# Patient Record
Sex: Female | Born: 1990 | Race: Black or African American | Hispanic: No | Marital: Single | Smoking: Current every day smoker
Health system: Southern US, Community
[De-identification: ages and names within clinical notes are randomized; demographics above are authoritative.]

## PROBLEM LIST (undated history)

## (undated) DIAGNOSIS — D649 Anemia, unspecified: Secondary | ICD-10-CM

## (undated) DIAGNOSIS — M1990 Unspecified osteoarthritis, unspecified site: Secondary | ICD-10-CM

## (undated) DIAGNOSIS — I10 Essential (primary) hypertension: Secondary | ICD-10-CM

## (undated) DIAGNOSIS — M109 Gout, unspecified: Secondary | ICD-10-CM

## (undated) HISTORY — DX: Anemia, unspecified: D64.9

## (undated) HISTORY — PX: LEG SURGERY: SHX1003

## (undated) HISTORY — PX: KNEE SURGERY: SHX244

---
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
## 2012-09-19 ENCOUNTER — Encounter (HOSPITAL_COMMUNITY): Payer: Self-pay | Admitting: Emergency Medicine

## 2012-09-19 ENCOUNTER — Emergency Department (HOSPITAL_COMMUNITY)
Admission: EM | Admit: 2012-09-19 | Discharge: 2012-09-19 | Disposition: A | Discharge status: Home / Self Care | Payer: Managed Care, Other (non HMO) | Attending: Emergency Medicine | Admitting: Emergency Medicine

## 2012-09-19 DIAGNOSIS — F17200 Nicotine dependence, unspecified, uncomplicated: Secondary | ICD-10-CM | POA: Insufficient documentation

## 2012-09-19 DIAGNOSIS — R110 Nausea: Secondary | ICD-10-CM | POA: Insufficient documentation

## 2012-09-19 DIAGNOSIS — N390 Urinary tract infection, site not specified: Secondary | ICD-10-CM | POA: Insufficient documentation

## 2012-09-19 DIAGNOSIS — Z3202 Encounter for pregnancy test, result negative: Secondary | ICD-10-CM | POA: Insufficient documentation

## 2012-09-19 LAB — URINALYSIS, ROUTINE W REFLEX MICROSCOPIC
Bilirubin Urine: NEGATIVE
Ketones, ur: NEGATIVE mg/dL
Nitrite: POSITIVE — AB
Urobilinogen, UA: 0.2 mg/dL (ref 0.0–1.0)
pH: 6.5 (ref 5.0–8.0)

## 2012-09-19 LAB — URINE MICROSCOPIC-ADD ON

## 2012-09-19 MED ORDER — IBUPROFEN 600 MG PO TABS: 600.0000 mg | ORAL_TABLET | Freq: Four times a day (QID) | ORAL | Status: DC | PRN

## 2012-09-19 MED ORDER — IBUPROFEN 800 MG PO TABS
800.0000 mg | ORAL_TABLET | Freq: Once | ORAL | Status: AC
  Administered 2012-09-19: 800 mg via ORAL
  Filled 2012-09-19: qty 1

## 2012-09-19 MED ORDER — CIPROFLOXACIN HCL 500 MG PO TABS: 250.0000 mg | ORAL_TABLET | Freq: Two times a day (BID) | ORAL | Status: DC

## 2012-09-19 NOTE — ED Notes (Signed)
Pt states she is having R abdominal and flank pain and thinks she might be pregnant. Pt is unable to say how late her period due to having irregular menses since birth of child less than a year ago. Pt also states is does not feel right when she urinates.

## 2012-09-19 NOTE — ED Provider Notes (Signed)
CSN: [id number]     Arrival date & time 09/19/12  0009 History   First MD Initiated Contact with Patient 09/19/12 0021     Chief Complaint  Patient presents with  . Flank Pain   (Consider location/radiation/quality/duration/timing/severity/associated sxs/prior Treatment) HPI Patient is a generally healthy 22 yo woman who is BIB family from home. The patient has noticed some mild nausea and some mild right flank pain x 3 days and can't recall the date of her LMP so she came to the ED. She denies dysuria, gross hematuria, fever, nausea and vomiting.   Pain in worse when the patient is laying in the right lateral decubitus position. Patient has not noticed variation in pain with relation to what or when she eats. No history of similar sx. No history of abdominal surgeries. No vaginal discharge.   Pain 3/10 at present. Pain worse when patient is on her feet for a prolonged about of time.   History reviewed. No pertinent past medical history. Past Surgical History  Procedure Laterality Date  . Knee surgery    . Leg surgery     Family History  Problem Relation Age of Onset  . Alcohol abuse Neg Hx   . Arthritis Neg Hx   . Asthma Neg Hx   . Birth defects Neg Hx   . Cancer Neg Hx   . COPD Neg Hx   . Depression Neg Hx   . Diabetes Neg Hx   . Drug abuse Neg Hx   . Early death Neg Hx   . Hearing loss Neg Hx   . Heart disease Neg Hx   . Hyperlipidemia Neg Hx   . Hypertension Neg Hx   . Kidney disease Neg Hx   . Learning disabilities Neg Hx   . Mental illness Neg Hx   . Mental retardation Neg Hx   . Miscarriages / Stillbirths Neg Hx   . Stroke Neg Hx   . Vision loss Neg Hx    History  Substance Use Topics  . Smoking status: Current Every Day Smoker  . Smokeless tobacco: Never Used  . Alcohol Use: No   OB History   Grav Para Term Preterm Abortions TAB SAB Ect Mult Living   2 2 2       2      Review of Systems 10 point ROS obtained and is negative with the exception of the sx  noted above.   Allergies  Review of patient's allergies indicates no known allergies.  Home Medications  No current outpatient prescriptions on file. BP 144/102  Pulse 91  Temp(Src) 98.6 F (37 C) (Oral)  Resp 18  SpO2 98% Physical Exam Gen: well developed and well nourished appearing, smiling and giggling. Head: NCAT Eyes: PERL, EOMI Nose: no epistaixis or rhinorrhea Mouth/throat: mucosa is moist and pink Neck: supple, no stridor Lungs: CTA B, no wheezing, rhonchi or rales CV: Regular rate and rhythm, no murmur Abd: soft, notender, nondistended Back: no ttp, no cva ttp Skin: Warm and dry Neuro: CN ii-xii grossly intact, no focal deficits Extremities: Normal to inspection Psyche; normal affect,  calm and cooperative.   ED Course  Procedures (including critical care time) Results for orders placed during the hospital encounter of 09/19/12 (from the past 24 hour(s))  URINALYSIS, ROUTINE W REFLEX MICROSCOPIC     Status: Abnormal   Collection Time    09/19/12 12:22 AM      Result Value Range   Color, Urine STRAW (*) YELLOW  APPearance CLOUDY (*) CLEAR   Specific Gravity, Urine 1.015  1.005 - 1.030   pH 6.5  5.0 - 8.0   Glucose, UA NEGATIVE  NEGATIVE mg/dL   Hgb urine dipstick LARGE (*) NEGATIVE   Bilirubin Urine NEGATIVE  NEGATIVE   Ketones, ur NEGATIVE  NEGATIVE mg/dL   Protein, ur NEGATIVE  NEGATIVE mg/dL   Urobilinogen, UA 0.2  0.0 - 1.0 mg/dL   Nitrite POSITIVE (*) NEGATIVE   Leukocytes, UA SMALL (*) NEGATIVE  URINE MICROSCOPIC-ADD ON     Status: Abnormal   Collection Time    09/19/12 12:22 AM      Result Value Range   Squamous Epithelial / LPF MANY (*) RARE   WBC, UA 11-20  <3 WBC/hpf   RBC / HPF 11-20  <3 RBC/hpf   Bacteria, UA MANY (*) RARE  POCT PREGNANCY, URINE     Status: None   Collection Time    09/19/12 12:34 AM      Result Value Range   Preg Test, Ur NEGATIVE  NEGATIVE   MDM  DDX: UTI, cervicitis, pregnancy with threatened abortion or  ectopic    Patient with UTI. Given alternative diagnosis and lack of any vaginal discharge, I don't feel that pelvic exam is needed as part of [facility]. We have ruled out pregnancy and related complications.   She is afebrile and nontoxic appearing. Stable for discharge with first po abx in ED. We will send urine for culture and manage empirically. Patient referred to the [facility] for f/u as needed and counseled to return to the ED for red flag sx.     Brandt Loosen, MD 09/19/12 0130

## 2012-09-19 NOTE — ED Notes (Signed)
C/o intermittent R flank pain since yesterday with difficulty urinating.  Unknown LMP

## 2012-09-21 LAB — URINE CULTURE: Colony Count: >=100000

## 2012-09-25 ENCOUNTER — Encounter (HOSPITAL_COMMUNITY): Payer: Self-pay | Admitting: *Deleted

## 2012-09-25 ENCOUNTER — Emergency Department (HOSPITAL_COMMUNITY)
Admission: EM | Admit: 2012-09-25 | Discharge: 2012-09-25 | Disposition: A | Discharge status: Home / Self Care | Payer: Managed Care, Other (non HMO) | Attending: Emergency Medicine | Admitting: Emergency Medicine

## 2012-09-25 DIAGNOSIS — N12 Tubulo-interstitial nephritis, not specified as acute or chronic: Secondary | ICD-10-CM

## 2012-09-25 DIAGNOSIS — F17200 Nicotine dependence, unspecified, uncomplicated: Secondary | ICD-10-CM | POA: Insufficient documentation

## 2012-09-25 DIAGNOSIS — B9689 Other specified bacterial agents as the cause of diseases classified elsewhere: Secondary | ICD-10-CM | POA: Insufficient documentation

## 2012-09-25 DIAGNOSIS — Z792 Long term (current) use of antibiotics: Secondary | ICD-10-CM | POA: Insufficient documentation

## 2012-09-25 DIAGNOSIS — Z87440 Personal history of urinary (tract) infections: Secondary | ICD-10-CM | POA: Insufficient documentation

## 2012-09-25 DIAGNOSIS — R350 Frequency of micturition: Secondary | ICD-10-CM | POA: Insufficient documentation

## 2012-09-25 DIAGNOSIS — A499 Bacterial infection, unspecified: Secondary | ICD-10-CM | POA: Insufficient documentation

## 2012-09-25 DIAGNOSIS — N760 Acute vaginitis: Secondary | ICD-10-CM | POA: Insufficient documentation

## 2012-09-25 DIAGNOSIS — Z3202 Encounter for pregnancy test, result negative: Secondary | ICD-10-CM | POA: Insufficient documentation

## 2012-09-25 DIAGNOSIS — Z79899 Other long term (current) drug therapy: Secondary | ICD-10-CM | POA: Insufficient documentation

## 2012-09-25 LAB — URINE MICROSCOPIC-ADD ON

## 2012-09-25 LAB — BASIC METABOLIC PANEL
BUN: 12 mg/dL (ref 6–23)
Calcium: 8.8 mg/dL (ref 8.4–10.5)
Creatinine, Ser: 0.88 mg/dL (ref 0.50–1.10)
GFR calc non Af Amer: >90 mL/min (ref >90)
Glucose, Bld: 96 mg/dL (ref 70–99)
Sodium: 135 mEq/L (ref 135–145)

## 2012-09-25 LAB — URINALYSIS, ROUTINE W REFLEX MICROSCOPIC
Bilirubin Urine: NEGATIVE
Glucose, UA: NEGATIVE mg/dL
Ketones, ur: NEGATIVE mg/dL
Protein, ur: NEGATIVE mg/dL

## 2012-09-25 LAB — WET PREP, GENITAL
Trich, Wet Prep: NONE SEEN
Yeast Wet Prep HPF POC: NONE SEEN

## 2012-09-25 LAB — CBC WITH DIFFERENTIAL/PLATELET
Eosinophils Absolute: 0.2 10*3/uL (ref 0.0–0.7)
Eosinophils Relative: 2 % (ref 0–5)
Lymphs Abs: 2.8 10*3/uL (ref 0.7–4.0)
MCH: 27.6 pg (ref 26.0–34.0)
MCV: 82.7 fL (ref 78.0–100.0)
Monocytes Absolute: 0.4 10*3/uL (ref 0.1–1.0)
Monocytes Relative: 4 % (ref 3–12)
Platelets: 177 10*3/uL (ref 150–400)
RBC: 4.45 MIL/uL (ref 3.87–5.11)

## 2012-09-25 MED ORDER — METRONIDAZOLE 500 MG PO TABS: 500.0000 mg | ORAL_TABLET | Freq: Two times a day (BID) | ORAL | Status: DC

## 2012-09-25 MED ORDER — DEXTROSE 5 % IV SOLN
1.0000 g | Freq: Once | INTRAVENOUS | Status: AC
  Administered 2012-09-25: 1 g via INTRAVENOUS
  Filled 2012-09-25: qty 10

## 2012-09-25 MED ORDER — CEPHALEXIN 500 MG PO CAPS: 500.0000 mg | ORAL_CAPSULE | Freq: Four times a day (QID) | ORAL | Status: DC

## 2012-09-25 NOTE — ED Notes (Addendum)
Per EMS pt coming from home with c/o severe right lower abdominal pain, radiating to groin and back. Pt was seen at [facility] last Tuesday for same and was diagnosed with UTI and given prescription for antibiotics but hasn't filled it yet. Pt also sts she is now having difficulties urinating and has noticed blood when wiping. Pt given 30mg  of toradol and 4 mg of zofran IV en route

## 2012-09-25 NOTE — ED Provider Notes (Signed)
CSN: [id number]     Arrival date & time 09/25/12  1002 History   First MD Initiated Contact with Patient 09/25/12 1047     Chief Complaint  Patient presents with  . Abdominal Pain   (Consider location/radiation/quality/duration/timing/severity/associated sxs/prior Treatment) HPI Comments: Pt comes in with cc of abd pain. Pt was recently diagnosed with UTI, didn't fill her script. States that she woke up today, with abd pain to the suprapubic region, and flank region. She has some nausea, chills, no emesis, fevers, diarrhea, no vaginal discharge, no hx of STDs.  The history is provided by the patient.    History reviewed. No pertinent past medical history. Past Surgical History  Procedure Laterality Date  . Knee surgery    . Leg surgery     Family History  Problem Relation Age of Onset  . Alcohol abuse Neg Hx   . Arthritis Neg Hx   . Asthma Neg Hx   . Birth defects Neg Hx   . Cancer Neg Hx   . COPD Neg Hx   . Depression Neg Hx   . Diabetes Neg Hx   . Drug abuse Neg Hx   . Early death Neg Hx   . Hearing loss Neg Hx   . Heart disease Neg Hx   . Hyperlipidemia Neg Hx   . Hypertension Neg Hx   . Kidney disease Neg Hx   . Learning disabilities Neg Hx   . Mental illness Neg Hx   . Mental retardation Neg Hx   . Miscarriages / Stillbirths Neg Hx   . Stroke Neg Hx   . Vision loss Neg Hx    History  Substance Use Topics  . Smoking status: Current Every Day Smoker  . Smokeless tobacco: Never Used  . Alcohol Use: No   OB History   Grav Para Term Preterm Abortions TAB SAB Ect Mult Living   2 2 2       2      Review of Systems  Constitutional: Negative for activity change.  HENT: Negative for neck pain.   Respiratory: Negative for shortness of breath.   Cardiovascular: Negative for chest pain.  Gastrointestinal: Negative for nausea, vomiting and abdominal pain.  Genitourinary: Positive for frequency and flank pain. Negative for dysuria, hematuria and vaginal discharge.   Neurological: Negative for headaches.    Allergies  Review of patient's allergies indicates no known allergies.  Home Medications   Current Outpatient Rx  Name  Route  Sig  Dispense  Refill  . cephALEXin (KEFLEX) 500 MG capsule   Oral   Take 1 capsule (500 mg total) by mouth 4 (four) times daily.   40 capsule   0   . ibuprofen (ADVIL,MOTRIN) 600 MG tablet   Oral   Take 1 tablet (600 mg total) by mouth every 6 (six) hours as needed for pain.   30 tablet   0   . metroNIDAZOLE (FLAGYL) 500 MG tablet   Oral   Take 1 tablet (500 mg total) by mouth 2 (two) times daily.   14 tablet   0    BP 133/81  Pulse 83  Temp(Src) 98.5 F (36.9 C) (Oral)  Resp 18  SpO2 98% Physical Exam  Constitutional: She is oriented to person, place, and time. She appears well-developed and well-nourished.  HENT:  Head: Normocephalic and atraumatic.  Eyes: Conjunctivae and EOM are normal. Pupils are equal, round, and reactive to light.  Neck: Normal range of motion. Neck supple.  Cardiovascular:  Normal rate, regular rhythm, normal heart sounds and intact distal pulses.   No murmur heard. Pulmonary/Chest: Effort normal. No respiratory distress. She has no wheezes.  Abdominal: Soft. Bowel sounds are normal. She exhibits no distension. There is tenderness. There is no rebound and no guarding.  Genitourinary: Vagina normal and uterus normal.  External exam - normal, no lesions Speculum exam: Pt has some white discharge, no blood Bimanual exam: Patient has no CMT, no adnexal tenderness or fullness and cervical os is closed  Neurological: She is alert and oriented to person, place, and time.  Skin: Skin is warm and dry.    ED Course  Procedures (including critical care time) Labs Review Labs Reviewed  WET PREP, GENITAL - Abnormal; Notable for the following:    Clue Cells Wet Prep HPF POC FEW (*)    WBC, Wet Prep HPF POC FEW (*)    All other components within normal limits  URINALYSIS, ROUTINE  W REFLEX MICROSCOPIC - Abnormal; Notable for the following:    APPearance CLOUDY (*)    Hgb urine dipstick LARGE (*)    Leukocytes, UA MODERATE (*)    All other components within normal limits  URINE MICROSCOPIC-ADD ON - Abnormal; Notable for the following:    Squamous Epithelial / LPF FEW (*)    Bacteria, UA MANY (*)    All other components within normal limits  GC/CHLAMYDIA PROBE AMP  URINE CULTURE  CBC WITH DIFFERENTIAL  BASIC METABOLIC PANEL  PREGNANCY, URINE   Imaging Review No results found.  MDM   1. Pyelonephritis   2. Bacterial vaginosis    Pt comes in with cc of abd pain, flank pain. Recently diagnosed with UTI, didn't take her scripts. Non toxic. GU exam is WNL. Will d./c. Return precautions provided.  Derwood Kaplan, MD 09/25/12 1615

## 2012-09-25 NOTE — ED Notes (Signed)
Bed: WA21 Expected date:  Expected time:  Means of arrival:  Comments: EMS  

## 2012-09-27 LAB — URINE CULTURE: Colony Count: >=100000

## 2012-12-19 ENCOUNTER — Emergency Department (HOSPITAL_COMMUNITY)
Admission: EM | Admit: 2012-12-19 | Discharge: 2012-12-19 | Disposition: A | Discharge status: Home / Self Care | Payer: Managed Care, Other (non HMO) | Source: Home / Self Care | Attending: Emergency Medicine | Admitting: Emergency Medicine

## 2012-12-19 ENCOUNTER — Encounter (HOSPITAL_COMMUNITY): Payer: Self-pay | Admitting: Emergency Medicine

## 2012-12-19 DIAGNOSIS — J029 Acute pharyngitis, unspecified: Secondary | ICD-10-CM

## 2012-12-19 DIAGNOSIS — J111 Influenza due to unidentified influenza virus with other respiratory manifestations: Secondary | ICD-10-CM

## 2012-12-19 MED ORDER — IBUPROFEN 800 MG PO TABS
ORAL_TABLET | ORAL | Status: AC
  Filled 2012-12-19: qty 1

## 2012-12-19 MED ORDER — HYDROCODONE-ACETAMINOPHEN 5-325 MG PO TABS: ORAL_TABLET | ORAL | Status: DC

## 2012-12-19 MED ORDER — HYDROCOD POLST-CHLORPHEN POLST 10-8 MG/5ML PO LQCR: 5.0000 mL | Freq: Two times a day (BID) | ORAL | Status: DC | PRN

## 2012-12-19 MED ORDER — ONDANSETRON 8 MG PO TBDP: 8.0000 mg | ORAL_TABLET | Freq: Three times a day (TID) | ORAL | Status: DC | PRN

## 2012-12-19 MED ORDER — IBUPROFEN 800 MG PO TABS
800.0000 mg | ORAL_TABLET | Freq: Once | ORAL | Status: AC
  Administered 2012-12-19: 800 mg via ORAL

## 2012-12-19 MED ORDER — OSELTAMIVIR PHOSPHATE 75 MG PO CAPS: 75.0000 mg | ORAL_CAPSULE | Freq: Two times a day (BID) | ORAL | Status: DC

## 2012-12-19 MED ORDER — AMOXICILLIN 500 MG PO CAPS: 500.0000 mg | ORAL_CAPSULE | Freq: Three times a day (TID) | ORAL | Status: DC

## 2012-12-19 NOTE — ED Notes (Signed)
Pt  Has symptoms  Of  sorethroat      /  Body  Aches     Nasal  Congestion        Stuffy  Nose    chillds  With  The  Symptoms  Beginning     2  Days  Ago         Pt is  Sitting  Upright on  Exam table  Speaking in  Complete  sentances     She  Is  Masked  And  Is  In a  Private  Room

## 2012-12-19 NOTE — ED Provider Notes (Signed)
Chief Complaint:   Chief Complaint  Patient presents with  . URI    History of Present Illness:   Anita [name] is a 22 year old female who has had a three-day history of a sore throat, subjective fever, chills, nasal congestion, rhinorrhea, loose and rattly but nonproductive cough, nausea, and vomiting. She denies any headache, muscle aches, abdominal pain, diarrhea, earache, or difficulty breathing. No known sick exposures.  Review of Systems:  Other than noted above, the patient denies any of the following symptoms: Systemic:  No fevers, chills, sweats, weight loss or gain, fatigue, or tiredness. Eye:  No redness or discharge. ENT:  No ear pain, drainage, headache, nasal congestion, drainage, sinus pressure, difficulty swallowing, or sore throat. Neck:  No neck pain or swollen glands. Lungs:  No cough, sputum production, hemoptysis, wheezing, chest tightness, shortness of breath or chest pain. GI:  No abdominal pain, nausea, vomiting or diarrhea.  PMFSH:  Past medical history, family history, social history, meds, and allergies were reviewed.  Physical Exam:   Vital signs:  BP 176/90  Pulse 76  Temp(Src) 98.6 F (37 C) (Oral)  Resp 18  SpO2 100% General:  Alert and oriented.  In no distress.  Skin warm and dry. Appears listless and is lying on the exam table, but does arouse and talk to me and was able to sit up. Eye:  No conjunctival injection or drainage. Lids were normal. ENT:  TMs and canals were normal, without erythema or inflammation.  Nasal mucosa was clear and uncongested, without drainage.  Mucous membranes were moist.  Pharynx was erythematous with no exudate or drainage.  There were no oral ulcerations or lesions. Neck:  Supple, no adenopathy, tenderness or mass. Lungs:  No respiratory distress.  Lungs were clear to auscultation, without wheezes, rales or rhonchi.  Breath sounds were clear and equal bilaterally.  Heart:  Regular rhythm, without gallops, murmers or  rubs. Skin:  Clear, warm, and dry, without rash or lesions.  Labs:   Results for orders placed during the hospital encounter of 12/19/12  POCT RAPID STREP A ([facility] URG CARE ONLY)      Result Value Range   Streptococcus, Group A Screen (Direct) NEGATIVE  NEGATIVE    Assessment:  The primary encounter diagnosis was Pharyngitis. A diagnosis of Influenza-like illness was also pertinent to this visit.  Plan:   1.  Meds:  The following meds were prescribed:   Discharge Medication List as of 12/19/2012  5:05 PM    START taking these medications   Details  amoxicillin (AMOXIL) 500 MG capsule Take 1 capsule (500 mg total) by mouth 3 (three) times daily., Starting 12/19/2012, Until Discontinued, Normal    chlorpheniramine-HYDROcodone (TUSSIONEX) 10-8 MG/5ML LQCR Take 5 mLs by mouth every 12 (twelve) hours as needed for cough., Starting 12/19/2012, Until Discontinued, Normal    HYDROcodone-acetaminophen (NORCO/VICODIN) 5-325 MG per tablet 1 to 2 tabs every 4 to 6 hours as needed for pain., Print    ondansetron (ZOFRAN ODT) 8 MG disintegrating tablet Take 1 tablet (8 mg total) by mouth every 8 (eight) hours as needed for nausea., Starting 12/19/2012, Until Discontinued, Normal    oseltamivir (TAMIFLU) 75 MG capsule Take 1 capsule (75 mg total) by mouth every 12 (twelve) hours., Starting 12/19/2012, Until Discontinued, Normal        2.  Patient Education/Counseling:  The patient was given appropriate handouts, self care instructions, and instructed in symptomatic relief.  Suggested clear liquids today, advancing to a brat diet  tomorrow.  3.  Follow up:  The patient was told to follow up if no better in 3 to 4 days, if becoming worse in any way, and given some red flag symptoms such as increasing fever, difficulty breathing, persistent vomiting which would prompt immediate return.  Follow up here as needed.      Reuben Likes, MD 12/19/12 [phone]

## 2012-12-21 LAB — CULTURE, GROUP A STREP

## 2013-04-25 ENCOUNTER — Encounter (HOSPITAL_COMMUNITY): Payer: Self-pay | Admitting: Emergency Medicine

## 2013-04-25 ENCOUNTER — Emergency Department (HOSPITAL_COMMUNITY)
Admission: EM | Admit: 2013-04-25 | Discharge: 2013-04-25 | Disposition: A | Discharge status: Home / Self Care | Payer: Managed Care, Other (non HMO) | Attending: Emergency Medicine | Admitting: Emergency Medicine

## 2013-04-25 ENCOUNTER — Emergency Department (HOSPITAL_COMMUNITY): Payer: Managed Care, Other (non HMO)

## 2013-04-25 DIAGNOSIS — R0981 Nasal congestion: Secondary | ICD-10-CM

## 2013-04-25 DIAGNOSIS — J40 Bronchitis, not specified as acute or chronic: Secondary | ICD-10-CM | POA: Insufficient documentation

## 2013-04-25 DIAGNOSIS — R6883 Chills (without fever): Secondary | ICD-10-CM | POA: Insufficient documentation

## 2013-04-25 DIAGNOSIS — J3489 Other specified disorders of nose and nasal sinuses: Secondary | ICD-10-CM | POA: Insufficient documentation

## 2013-04-25 DIAGNOSIS — R51 Headache: Secondary | ICD-10-CM | POA: Insufficient documentation

## 2013-04-25 DIAGNOSIS — E663 Overweight: Secondary | ICD-10-CM | POA: Insufficient documentation

## 2013-04-25 DIAGNOSIS — F17200 Nicotine dependence, unspecified, uncomplicated: Secondary | ICD-10-CM | POA: Insufficient documentation

## 2013-04-25 MED ORDER — SALINE SPRAY 0.65 % NA SOLN: 1.0000 | NASAL | Status: DC | PRN

## 2013-04-25 MED ORDER — ALBUTEROL SULFATE HFA 108 (90 BASE) MCG/ACT IN AERS
2.0000 | INHALATION_SPRAY | RESPIRATORY_TRACT | Status: DC | PRN
  Administered 2013-04-25: 2 via RESPIRATORY_TRACT
  Filled 2013-04-25: qty 6.7

## 2013-04-25 MED ORDER — PREDNISONE 50 MG PO TABS: 50.0000 mg | ORAL_TABLET | Freq: Every day | ORAL | Status: DC

## 2013-04-25 MED ORDER — ALBUTEROL SULFATE HFA 108 (90 BASE) MCG/ACT IN AERS: 2.0000 | INHALATION_SPRAY | RESPIRATORY_TRACT | Status: DC | PRN

## 2013-04-25 NOTE — ED Provider Notes (Signed)
CSN: [id number]     Arrival date & time 04/25/13  1422 History   First MD Initiated Contact with Patient 04/25/13 1523     Chief Complaint  Patient presents with  . Nasal Congestion  . Cough     (Consider location/radiation/quality/duration/timing/severity/associated sxs/prior Treatment) HPI  This is a 23 year old female who is a current smoker who presents with nasal congestion and cough. Patient reports a one-week history of a productive cough. She reports chills but no documented fevers.   Patient also reports nasal congestion and headache. Patient took ibuprofen for headache and neck better. She denies any neck pain or stiffness.  Patient denies any sick contacts. She denies any history of asthma or requiring albuterol. Patient feels that she's getting worse.  History reviewed. No pertinent past medical history. Past Surgical History  Procedure Laterality Date  . Knee surgery    . Leg surgery     Family History  Problem Relation Age of Onset  . Alcohol abuse Neg Hx   . Arthritis Neg Hx   . Asthma Neg Hx   . Birth defects Neg Hx   . Cancer Neg Hx   . COPD Neg Hx   . Depression Neg Hx   . Diabetes Neg Hx   . Drug abuse Neg Hx   . Early death Neg Hx   . Hearing loss Neg Hx   . Heart disease Neg Hx   . Hyperlipidemia Neg Hx   . Hypertension Neg Hx   . Kidney disease Neg Hx   . Learning disabilities Neg Hx   . Mental illness Neg Hx   . Mental retardation Neg Hx   . Miscarriages / Stillbirths Neg Hx   . Stroke Neg Hx   . Vision loss Neg Hx    History  Substance Use Topics  . Smoking status: Current Every Day Smoker  . Smokeless tobacco: Never Used  . Alcohol Use: No   OB History   Grav Para Term Preterm Abortions TAB SAB Ect Mult Living   2 2 2       2      Review of Systems  Constitutional: Positive for chills. Negative for fever.  Respiratory: Positive for cough and shortness of breath. Negative for chest tightness.   Cardiovascular: Negative for chest pain.   Gastrointestinal: Negative for nausea, vomiting and abdominal pain.  Genitourinary: Negative for dysuria.  Musculoskeletal: Negative for back pain, neck pain and neck stiffness.  Neurological: Negative for headaches.  All other systems reviewed and are negative.     Allergies  Review of patient's allergies indicates no known allergies.  Home Medications   Prior to Admission medications   Not on File   BP 143/100  Pulse 87  Temp(Src) 98 F (36.7 C) (Oral)  Resp 16  SpO2 95% Physical Exam  Nursing note and vitals reviewed. Constitutional: She is oriented to person, place, and time. She appears well-developed and well-nourished.  Overweight  HENT:  Head: Normocephalic and atraumatic.  Eyes: Pupils are equal, round, and reactive to light.  Neck: Neck supple.  Cardiovascular: Normal rate, regular rhythm and normal heart sounds.   No murmur heard. Pulmonary/Chest: Effort normal. No respiratory distress. She has no wheezes.  Scant expiratory wheeze, rales noted on the left, fair air movement  Abdominal: Soft. Bowel sounds are normal. There is no tenderness.  Musculoskeletal: She exhibits no edema.  Neurological: She is alert and oriented to person, place, and time.  Skin: Skin is warm and dry.  Psychiatric: She has a normal mood and affect.    ED Course  Procedures (including critical care time) Labs Review Labs Reviewed - No data to display  Imaging Review Dg Chest 2 View  04/25/2013   CLINICAL DATA:  NASAL CONGESTION COUGH  EXAM: CHEST  2 VIEW  COMPARISON:  None.  FINDINGS: The lung volumes. Cardiac silhouette within normal limits. Mild areas of increased density within the lung bases appreciated primarily in the PA view. The osseous structures unremarkable.  IMPRESSION: Atelectasis versus infiltrates within the lung bases.   Electronically Signed   By: Salome HolmesHector  Cooper M.D.   On: 04/25/2013 16:20     EKG Interpretation None      MDM   Final diagnoses:   Bronchitis  Nasal congestion    Patient presents with shortness of breath, cough, nasal congestion. Vital signs are reassuring. Pulse ox is 95%. She is afebrile. Patient does have scant expiratory wheezing and rales.  Given history chills, will obtain chest x-ray. Patient was given albuterol. Chest x-ray concerning for bilateral atelectasis versus infiltrates.  Favor atelectasis given patient is afebrile. We'll treat for viral bronchitis with steroids and albuterol given patient's smoking history. He should also help with cough. Patient was also be instructed to use nasal saline.  After history, exam, and medical workup I feel the patient has been appropriately medically screened and is safe for discharge home. Pertinent diagnoses were discussed with the patient. Patient was given return precautions.     Shon Batonourtney F Compton Brigance, MD 04/25/13 1630

## 2013-04-25 NOTE — ED Notes (Signed)
Pt c/o nasal congestion and nonproductive cough x 1 day.

## 2013-04-25 NOTE — Discharge Instructions (Signed)

## 2013-07-08 ENCOUNTER — Emergency Department (HOSPITAL_COMMUNITY)
Admission: EM | Admit: 2013-07-08 | Discharge: 2013-07-08 | Disposition: A | Discharge status: Home / Self Care | Payer: Managed Care, Other (non HMO) | Attending: Emergency Medicine | Admitting: Emergency Medicine

## 2013-07-08 ENCOUNTER — Encounter (HOSPITAL_COMMUNITY): Payer: Self-pay | Admitting: Emergency Medicine

## 2013-07-08 DIAGNOSIS — Z79899 Other long term (current) drug therapy: Secondary | ICD-10-CM | POA: Insufficient documentation

## 2013-07-08 DIAGNOSIS — F17200 Nicotine dependence, unspecified, uncomplicated: Secondary | ICD-10-CM | POA: Insufficient documentation

## 2013-07-08 DIAGNOSIS — M109 Gout, unspecified: Secondary | ICD-10-CM

## 2013-07-08 MED ORDER — INDOMETHACIN 25 MG PO CAPS: 25.0000 mg | ORAL_CAPSULE | Freq: Three times a day (TID) | ORAL | Status: DC | PRN

## 2013-07-08 MED ORDER — OXYCODONE-ACETAMINOPHEN 5-325 MG PO TABS: 1.0000 | ORAL_TABLET | Freq: Four times a day (QID) | ORAL | Status: DC | PRN

## 2013-07-08 NOTE — ED Notes (Signed)
Patient discharged with instructions and RX. Using teach Back method. NAD noted at the time of discharge.

## 2013-07-08 NOTE — ED Provider Notes (Signed)
CSN: [id number]     Arrival date & time 07/08/13  1405 History   This chart was scribed for non-physician practitioner Anita Peliffany Jaz Laningham, PA-C, working with Anita HornJohn M Bednar, MD, by Anita [name], ED Scribe. This patient was seen in room TR06C/TR06C and the patient's care was started at 2:27 PM. None    Chief Complaint  Patient presents with  . Foot Pain    Right Foot    The history is provided by the patient. No language interpreter was used.   HPI Comments: Anita [name] is a 23 y.o. female who presents to the Emergency Department complaining of right great toe pain which has been present for a week and has been associated with swelling and decreased ROM.. The pt rates the pain as 7/10, and she states the pain is increased with movement and pressure. She denies recent injuries to the toe. The pt questions if she has gout, though she denies a h/o gout. She also denies a family h/o gout. She denies a h/o DM.  History reviewed. No pertinent past medical history. Past Surgical History  Procedure Laterality Date  . Knee surgery    . Leg surgery     Family History  Problem Relation Age of Onset  . Alcohol abuse Neg Hx   . Arthritis Neg Hx   . Asthma Neg Hx   . Birth defects Neg Hx   . Cancer Neg Hx   . COPD Neg Hx   . Depression Neg Hx   . Diabetes Neg Hx   . Drug abuse Neg Hx   . Early death Neg Hx   . Hearing loss Neg Hx   . Heart disease Neg Hx   . Hyperlipidemia Neg Hx   . Hypertension Neg Hx   . Kidney disease Neg Hx   . Learning disabilities Neg Hx   . Mental illness Neg Hx   . Mental retardation Neg Hx   . Miscarriages / Stillbirths Neg Hx   . Stroke Neg Hx   . Vision loss Neg Hx    History  Substance Use Topics  . Smoking status: Current Every Day Smoker  . Smokeless tobacco: Never Used  . Alcohol Use: No   OB History   Grav Para Term Preterm Abortions TAB SAB Ect Mult Living   2 2 2       2      Review of Systems  Constitutional: Negative for fever.   Musculoskeletal: Positive for arthralgias.  All other systems reviewed and are negative.   Allergies  Review of patient's allergies indicates no known allergies.  Home Medications   Prior to Admission medications   Medication Sig Start Date End Date Taking? Authorizing Provider  albuterol (PROVENTIL HFA;VENTOLIN HFA) 108 (90 BASE) MCG/ACT inhaler Inhale 2 puffs into the lungs every 6 (six) hours as needed for wheezing or shortness of breath.   Yes Historical Provider, MD  indomethacin (INDOCIN) 25 MG capsule Take 1 capsule (25 mg total) by mouth 3 (three) times daily as needed. 07/08/13   Anita Matasiffany G Kainoa Swoboda, PA-C  oxyCODONE-acetaminophen (PERCOCET/ROXICET) 5-325 MG per tablet Take 1-2 tablets by mouth every 6 (six) hours as needed for severe pain. 07/08/13   Anita Gorter Anita SealG Kathalina Ostermann, PA-C  predniSONE (DELTASONE) 50 MG tablet Take 1 tablet (50 mg total) by mouth daily. 04/25/13   Anita Batonourtney F Horton, MD  sodium chloride (OCEAN) 0.65 % SOLN nasal spray Place 1 spray into both nostrils as needed for congestion. 04/25/13   Anita Maskerourtney F  Horton, MD   Triage Vitals: BP 132/93  Pulse 79  Temp(Src) 97 F (36.1 C) (Oral)  Resp 18  Ht 5\' 4"  (1.626 m)  SpO2 95%  LMP 06/08/2013  Physical Exam  Nursing note and vitals reviewed. Constitutional: She is oriented to person, place, and time. She appears well-developed and well-nourished. No distress.  HENT:  Head: Normocephalic and atraumatic.  Eyes: Conjunctivae and EOM are normal.  Neck: Neck supple. No tracheal deviation present.  Cardiovascular: Normal rate.   Pulmonary/Chest: Effort normal. No respiratory distress.  Musculoskeletal: Normal range of motion.  Right great toe is indurated and very tender to the skin with light touch. I see no signs of injury, wounds or infection. Decreased ROM due to pain.  Neurological: She is alert and oriented to person, place, and time.  Skin: Skin is warm and dry.  Psychiatric: She has a normal mood and affect. Her behavior  is normal.    ED Course  Procedures (including critical care time)  DIAGNOSTIC STUDIES: Oxygen Saturation is 95% on room air, normal by my interpretation.    COORDINATION OF CARE:  2:30 PM- Discussed treatment plan with patient, and the patient agreed to the plan. The plan includes medication for gout and a  post-op boot.   Labs Review Labs Reviewed - No data to display  Imaging Review No results found.   EKG Interpretation None      MDM   Final diagnoses:  Gout of big toe    Pts symptoms are consistent with gout. She reports never having gout in the past. I advised her that infection and gout can look very similar but she has no history that would be consistent with infection. Therefore, I will start by treating for gout. She has been told to return to the ER in 2-3 days if symptoms are not improving and sooner if it gets worse.  23 y.o.[name]'s evaluation in the Emergency Department is complete. It has been determined that no acute conditions requiring further emergency intervention are present at this time. The patient/guardian have been advised of the diagnosis and plan. We have discussed signs and symptoms that warrant return to the ED, such as changes or worsening in symptoms.  Vital signs are stable at discharge. Filed Vitals:   07/08/13 1414  BP: 132/93  Pulse:   Temp:   Resp:     Patient/guardian has voiced understanding and agreed to follow-up with the PCP or specialist.  I personally performed the services described in this documentation, which was scribed in my presence. The recorded information has been reviewed and is accurate.    Anita Matasiffany G Davetta Olliff, PA-C 07/08/13 1446

## 2013-07-08 NOTE — ED Notes (Signed)
Pt c/o pain to right foot x 1 week, Pt denies recent injury.

## 2013-07-08 NOTE — ED Provider Notes (Signed)
Medical screening examination/treatment/procedure(s) were performed by non-physician practitioner and as supervising physician I was immediately available for consultation/collaboration.   EKG Interpretation None       Hurman HornJohn M Abner Ardis, MD 07/08/13 [phone]

## 2013-07-08 NOTE — Discharge Instructions (Signed)

## 2013-09-26 ENCOUNTER — Emergency Department (HOSPITAL_COMMUNITY): Payer: Managed Care, Other (non HMO)

## 2013-09-26 ENCOUNTER — Encounter (HOSPITAL_COMMUNITY): Payer: Self-pay | Admitting: Emergency Medicine

## 2013-09-26 DIAGNOSIS — F17200 Nicotine dependence, unspecified, uncomplicated: Secondary | ICD-10-CM | POA: Diagnosis not present

## 2013-09-26 DIAGNOSIS — Z79899 Other long term (current) drug therapy: Secondary | ICD-10-CM | POA: Insufficient documentation

## 2013-09-26 DIAGNOSIS — R51 Headache: Secondary | ICD-10-CM | POA: Insufficient documentation

## 2013-09-26 DIAGNOSIS — S0993XA Unspecified injury of face, initial encounter: Secondary | ICD-10-CM | POA: Insufficient documentation

## 2013-09-26 DIAGNOSIS — S199XXA Unspecified injury of neck, initial encounter: Secondary | ICD-10-CM

## 2013-09-26 DIAGNOSIS — Z8739 Personal history of other diseases of the musculoskeletal system and connective tissue: Secondary | ICD-10-CM | POA: Insufficient documentation

## 2013-09-26 DIAGNOSIS — R062 Wheezing: Secondary | ICD-10-CM | POA: Diagnosis not present

## 2013-09-26 DIAGNOSIS — Y92410 Unspecified street and highway as the place of occurrence of the external cause: Secondary | ICD-10-CM | POA: Insufficient documentation

## 2013-09-26 DIAGNOSIS — I10 Essential (primary) hypertension: Secondary | ICD-10-CM | POA: Insufficient documentation

## 2013-09-26 DIAGNOSIS — Y9389 Activity, other specified: Secondary | ICD-10-CM | POA: Diagnosis not present

## 2013-09-26 DIAGNOSIS — IMO0002 Reserved for concepts with insufficient information to code with codable children: Secondary | ICD-10-CM | POA: Insufficient documentation
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
## 2013-09-27 ENCOUNTER — Emergency Department (HOSPITAL_COMMUNITY)
Admission: EM | Admit: 2013-09-27 | Discharge: 2013-09-27 | Disposition: A | Discharge status: Home / Self Care | Payer: Managed Care, Other (non HMO) | Attending: Emergency Medicine | Admitting: Emergency Medicine

## 2013-09-27 DIAGNOSIS — R062 Wheezing: Secondary | ICD-10-CM

## 2013-09-27 HISTORY — DX: Essential (primary) hypertension: I10

## 2013-09-27 HISTORY — DX: Unspecified osteoarthritis, unspecified site: M19.90

## 2013-09-27 MED ORDER — ALBUTEROL SULFATE HFA 108 (90 BASE) MCG/ACT IN AERS
2.0000 | INHALATION_SPRAY | RESPIRATORY_TRACT | Status: DC | PRN
  Administered 2013-09-27: 2 via RESPIRATORY_TRACT
  Filled 2013-09-27: qty 6.7

## 2013-09-27 MED ORDER — IBUPROFEN 400 MG PO TABS
800.0000 mg | ORAL_TABLET | Freq: Once | ORAL | Status: AC
  Administered 2013-09-27: 800 mg via ORAL
  Filled 2013-09-27: qty 2

## 2013-09-27 MED ORDER — CYCLOBENZAPRINE HCL 10 MG PO TABS: 10.0000 mg | ORAL_TABLET | Freq: Two times a day (BID) | ORAL | Status: DC | PRN

## 2013-09-27 MED ORDER — TRAMADOL HCL 50 MG PO TABS: 50.0000 mg | ORAL_TABLET | Freq: Four times a day (QID) | ORAL | Status: DC | PRN
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
## 2013-09-27 NOTE — ED Provider Notes (Signed)
Medical screening examination/treatment/procedure(s) were performed by non-physician practitioner and as supervising physician I was immediately available for consultation/collaboration.   EKG Interpretation None        Tomasita Crumble, MD 09/27/13 1816
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
## 2013-11-06 ENCOUNTER — Encounter (HOSPITAL_COMMUNITY): Payer: Self-pay | Admitting: Emergency Medicine

## 2014-03-14 ENCOUNTER — Encounter (HOSPITAL_COMMUNITY): Payer: Self-pay

## 2014-03-14 ENCOUNTER — Emergency Department (INDEPENDENT_AMBULATORY_CARE_PROVIDER_SITE_OTHER)
Admission: EM | Admit: 2014-03-14 | Discharge: 2014-03-14 | Disposition: A | Discharge status: Home / Self Care | Payer: Managed Care, Other (non HMO) | Source: Home / Self Care | Attending: Family Medicine | Admitting: Family Medicine

## 2014-03-14 DIAGNOSIS — S86911A Strain of unspecified muscle(s) and tendon(s) at lower leg level, right leg, initial encounter: Secondary | ICD-10-CM

## 2014-03-14 DIAGNOSIS — M778 Other enthesopathies, not elsewhere classified: Secondary | ICD-10-CM

## 2014-03-14 DIAGNOSIS — S86811A Strain of other muscle(s) and tendon(s) at lower leg level, right leg, initial encounter: Secondary | ICD-10-CM

## 2014-03-14 DIAGNOSIS — M7581 Other shoulder lesions, right shoulder: Secondary | ICD-10-CM

## 2014-03-14 MED ORDER — DICLOFENAC SODIUM 1 % TD GEL: 4.0000 g | Freq: Four times a day (QID) | TRANSDERMAL | Status: DC
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
## 2014-03-14 NOTE — Discharge Instructions (Signed)
Ice and ace and gel as prescribed, return as needed.
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
## 2014-07-09 ENCOUNTER — Encounter (HOSPITAL_COMMUNITY): Payer: Self-pay | Admitting: Nurse Practitioner

## 2014-07-09 ENCOUNTER — Emergency Department (HOSPITAL_COMMUNITY): Payer: Managed Care, Other (non HMO)

## 2014-07-09 ENCOUNTER — Emergency Department (HOSPITAL_COMMUNITY)
Admission: EM | Admit: 2014-07-09 | Discharge: 2014-07-09 | Disposition: A | Discharge status: Home / Self Care | Payer: Managed Care, Other (non HMO) | Attending: Emergency Medicine | Admitting: Emergency Medicine

## 2014-07-09 DIAGNOSIS — Y998 Other external cause status: Secondary | ICD-10-CM | POA: Insufficient documentation

## 2014-07-09 DIAGNOSIS — M109 Gout, unspecified: Secondary | ICD-10-CM | POA: Insufficient documentation

## 2014-07-09 DIAGNOSIS — Z720 Tobacco use: Secondary | ICD-10-CM | POA: Insufficient documentation

## 2014-07-09 DIAGNOSIS — M1990 Unspecified osteoarthritis, unspecified site: Secondary | ICD-10-CM | POA: Insufficient documentation

## 2014-07-09 DIAGNOSIS — Y9389 Activity, other specified: Secondary | ICD-10-CM | POA: Insufficient documentation

## 2014-07-09 DIAGNOSIS — S0591XA Unspecified injury of right eye and orbit, initial encounter: Secondary | ICD-10-CM | POA: Insufficient documentation

## 2014-07-09 DIAGNOSIS — R52 Pain, unspecified: Secondary | ICD-10-CM

## 2014-07-09 DIAGNOSIS — I10 Essential (primary) hypertension: Secondary | ICD-10-CM | POA: Insufficient documentation

## 2014-07-09 DIAGNOSIS — Z79899 Other long term (current) drug therapy: Secondary | ICD-10-CM | POA: Insufficient documentation

## 2014-07-09 DIAGNOSIS — Y9289 Other specified places as the place of occurrence of the external cause: Secondary | ICD-10-CM | POA: Insufficient documentation

## 2014-07-09 HISTORY — DX: Gout, unspecified: M10.9

## 2014-07-09 MED ORDER — HYDROCODONE-ACETAMINOPHEN 5-325 MG PO TABS: 1.0000 | ORAL_TABLET | Freq: Four times a day (QID) | ORAL | Status: DC | PRN

## 2014-07-09 MED ORDER — HYDROCODONE-ACETAMINOPHEN 5-325 MG PO TABS
2.0000 | ORAL_TABLET | Freq: Once | ORAL | Status: AC
  Administered 2014-07-09: 2 via ORAL
  Filled 2014-07-09: qty 2
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
## 2014-10-12 DIAGNOSIS — Z332 Encounter for elective termination of pregnancy: Secondary | ICD-10-CM | POA: Insufficient documentation

## 2015-03-13 ENCOUNTER — Encounter (HOSPITAL_COMMUNITY): Payer: Self-pay | Admitting: Emergency Medicine

## 2015-03-13 ENCOUNTER — Emergency Department (HOSPITAL_COMMUNITY): Payer: Self-pay

## 2015-03-13 ENCOUNTER — Emergency Department (HOSPITAL_COMMUNITY)
Admission: EM | Admit: 2015-03-13 | Discharge: 2015-03-13 | Disposition: A | Discharge status: Home / Self Care | Payer: Self-pay | Attending: Emergency Medicine | Admitting: Emergency Medicine

## 2015-03-13 DIAGNOSIS — I10 Essential (primary) hypertension: Secondary | ICD-10-CM | POA: Insufficient documentation

## 2015-03-13 DIAGNOSIS — Y9389 Activity, other specified: Secondary | ICD-10-CM | POA: Insufficient documentation

## 2015-03-13 DIAGNOSIS — X509XXA Other and unspecified overexertion or strenuous movements or postures, initial encounter: Secondary | ICD-10-CM | POA: Insufficient documentation

## 2015-03-13 DIAGNOSIS — S93401A Sprain of unspecified ligament of right ankle, initial encounter: Secondary | ICD-10-CM | POA: Insufficient documentation

## 2015-03-13 DIAGNOSIS — Y999 Unspecified external cause status: Secondary | ICD-10-CM | POA: Insufficient documentation

## 2015-03-13 DIAGNOSIS — Y92481 Parking lot as the place of occurrence of the external cause: Secondary | ICD-10-CM | POA: Insufficient documentation

## 2015-03-13 DIAGNOSIS — M1990 Unspecified osteoarthritis, unspecified site: Secondary | ICD-10-CM | POA: Insufficient documentation

## 2015-03-13 DIAGNOSIS — F17210 Nicotine dependence, cigarettes, uncomplicated: Secondary | ICD-10-CM | POA: Insufficient documentation

## 2015-03-13 MED ORDER — IBUPROFEN 400 MG PO TABS
400.0000 mg | ORAL_TABLET | Freq: Once | ORAL | Status: AC
  Administered 2015-03-13: 400 mg via ORAL
  Filled 2015-03-13: qty 1

## 2015-03-13 NOTE — Discharge Instructions (Signed)
Rest, Ice intermittently (in the first 24-48 hours), Gentle compression with an Ace wrap, and elevate (Limb above the level of the heart) °  °Take up to 800mg of ibuprofen (that is usually 4 over the counter pills)  3 times a day for 5 days. Take with food. ° ° °Ankle Sprain °An ankle sprain is an injury to the strong, fibrous tissues (ligaments) that hold the bones of your ankle joint together.  °CAUSES °An ankle sprain is usually caused by a fall or by twisting your ankle. Ankle sprains most commonly occur when you step on the outer edge of your foot, and your ankle turns inward. People who participate in sports are more prone to these types of injuries.  °SYMPTOMS  °· Pain in your ankle. The pain may be present at rest or only when you are trying to stand or walk. °· Swelling. °· Bruising. Bruising may develop immediately or within 1 to 2 days after your injury. °· Difficulty standing or walking, particularly when turning corners or changing directions. °DIAGNOSIS  °Your caregiver will ask you details about your injury and perform a physical exam of your ankle to determine if you have an ankle sprain. During the physical exam, your caregiver will press on and apply pressure to specific areas of your foot and ankle. Your caregiver will try to move your ankle in certain ways. An X-ray exam may be done to be sure a bone was not broken or a ligament did not separate from one of the bones in your ankle (avulsion fracture).  °TREATMENT  °Certain types of braces can help stabilize your ankle. Your caregiver can make a recommendation for this. Your caregiver may recommend the use of medicine for pain. If your sprain is severe, your caregiver may refer you to a surgeon who helps to restore function to parts of your skeletal system (orthopedist) or a physical therapist. °HOME CARE INSTRUCTIONS  °· Apply ice to your injury for 1-2 days or as directed by your caregiver. Applying ice helps to reduce inflammation and  pain. °¨ Put ice in a plastic bag. °¨ Place a towel between your skin and the bag. °¨ Leave the ice on for 15-20 minutes at a time, every 2 hours while you are awake. °· Only take over-the-counter or prescription medicines for pain, discomfort, or fever as directed by your caregiver. °· Elevate your injured ankle above the level of your heart as much as possible for 2-3 days. °· If your caregiver recommends crutches, use them as instructed. Gradually put weight on the affected ankle. Continue to use crutches or a cane until you can walk without feeling pain in your ankle. °· If you have a plaster splint, wear the splint as directed by your caregiver. Do not rest it on anything harder than a pillow for the first 24 hours. Do not put weight on it. Do not get it wet. You may take it off to take a shower or bath. °· You may have been given an elastic bandage to wear around your ankle to provide support. If the elastic bandage is too tight (you have numbness or tingling in your foot or your foot becomes cold and blue), adjust the bandage to make it comfortable. °· If you have an air splint, you may blow more air into it or let air out to make it more comfortable. You may take your splint off at night and before taking a shower or bath. Wiggle your toes in the splint several   times per day to decrease swelling. °SEEK MEDICAL CARE IF:  °· You have rapidly increasing bruising or swelling. °· Your toes feel extremely cold or you lose feeling in your foot. °· Your pain is not relieved with medicine. °SEEK IMMEDIATE MEDICAL CARE IF: °· Your toes are numb or blue. °· You have severe pain that is increasing. °MAKE SURE YOU:  °· Understand these instructions. °· Will watch your condition. °· Will get help right away if you are not doing well or get worse. °  °This information is not intended to replace advice given to you by your health care provider. Make sure you discuss any questions you have with your health care provider. °   °Document Released: 12/22/2004 Document Revised: 01/12/2014 Document Reviewed: 01/03/2011 °Elsevier Interactive Patient Education ©2016 [employer] ° °
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
## 2015-07-02 ENCOUNTER — Ambulatory Visit (HOSPITAL_COMMUNITY)
Admission: EM | Admit: 2015-07-02 | Discharge: 2015-07-02 | Disposition: A | Discharge status: Home / Self Care | Payer: Self-pay | Attending: Family Medicine | Admitting: Family Medicine

## 2015-07-02 ENCOUNTER — Encounter (HOSPITAL_COMMUNITY): Payer: Self-pay | Admitting: Emergency Medicine

## 2015-07-02 DIAGNOSIS — I10 Essential (primary) hypertension: Secondary | ICD-10-CM | POA: Insufficient documentation

## 2015-07-02 DIAGNOSIS — M549 Dorsalgia, unspecified: Secondary | ICD-10-CM | POA: Insufficient documentation

## 2015-07-02 DIAGNOSIS — Z79899 Other long term (current) drug therapy: Secondary | ICD-10-CM | POA: Insufficient documentation

## 2015-07-02 DIAGNOSIS — R102 Pelvic and perineal pain: Secondary | ICD-10-CM | POA: Insufficient documentation

## 2015-07-02 DIAGNOSIS — Z87891 Personal history of nicotine dependence: Secondary | ICD-10-CM | POA: Insufficient documentation

## 2015-07-02 LAB — POCT URINALYSIS DIP (DEVICE)
GLUCOSE, UA: 250 mg/dL — AB
Ketones, ur: 15 mg/dL — AB
NITRITE: NEGATIVE
PH: 5.5 (ref 5.0–8.0)
Specific Gravity, Urine: 1.015 (ref 1.005–1.030)
UROBILINOGEN UA: 2 mg/dL — AB (ref 0.0–1.0)

## 2015-07-02 LAB — POCT PREGNANCY, URINE: PREG TEST UR: NEGATIVE

## 2015-07-02 NOTE — ED Notes (Signed)
Pt has been suffering from menstrual cramps since yesterday with the start of her period but she has also been having sharp pains in her right lower back that radiates around to the front into her pelvic area.  She states these pains last for about 10 minutes and she has had several a day.

## 2015-07-02 NOTE — ED Provider Notes (Signed)
CSN: [id number]     Arrival date & time 07/02/15  1506 History   First MD Initiated Contact with Patient 07/02/15 1539     Chief Complaint  Patient presents with  . Back Pain    right   (Consider location/radiation/quality/duration/timing/severity/associated sxs/prior Treatment) Patient is a [age] y.o. female presenting with abdominal pain. The history is provided by the patient.  Abdominal Pain Pain location:  Suprapubic Pain quality: cramping and sharp   Pain radiates to:  R flank and L flank Pain severity:  Moderate Onset quality:  Sudden Duration:  1 day Progression:  Unchanged Chronicity:  New Context comment:  Onset with menses yest. Relieved by:  None tried Worsened by:  Nothing tried Ineffective treatments:  None tried Associated symptoms: nausea and vaginal bleeding   Associated symptoms: no chills, no diarrhea, no dysuria, no fever, no vaginal discharge and no vomiting     Past Medical History  Diagnosis Date  . Hypertension   . Arthritis   . Gout    Past Surgical History  Procedure Laterality Date  . Knee surgery    . Leg surgery     Family History  Problem Relation Age of Onset  . Alcohol abuse Neg Hx   . Arthritis Neg Hx   . Asthma Neg Hx   . Birth defects Neg Hx   . Cancer Neg Hx   . COPD Neg Hx   . Depression Neg Hx   . Diabetes Neg Hx   . Drug abuse Neg Hx   . Early death Neg Hx   . Hearing loss Neg Hx   . Heart disease Neg Hx   . Hyperlipidemia Neg Hx   . Hypertension Neg Hx   . Kidney disease Neg Hx   . Learning disabilities Neg Hx   . Mental illness Neg Hx   . Mental retardation Neg Hx   . Miscarriages / Stillbirths Neg Hx   . Stroke Neg Hx   . Vision loss Neg Hx    Social History  Substance Use Topics  . Smoking status: Former Smoker    Types: Cigarettes  . Smokeless tobacco: Never Used  . Alcohol Use: No   OB History    Gravida Para Term Preterm AB TAB SAB Ectopic Multiple Living   3 2 2  1     2      Review of Systems   Constitutional: Negative for fever and chills.  Gastrointestinal: Positive for nausea and abdominal pain. Negative for vomiting and diarrhea.  Genitourinary: Positive for flank pain, vaginal bleeding and pelvic pain. Negative for dysuria, urgency, frequency, vaginal discharge and difficulty urinating.  All other systems reviewed and are negative.   Allergies  Review of patient's allergies indicates no known allergies.  Home Medications   Prior to Admission medications   Medication Sig Start Date End Date Taking? Authorizing Provider  albuterol (PROVENTIL HFA;VENTOLIN HFA) 108 (90 BASE) MCG/ACT inhaler Inhale 2 puffs into the lungs every 6 (six) hours as needed for wheezing or shortness of breath.    Historical Provider, MD  cyclobenzaprine (FLEXERIL) 10 MG tablet Take 1 tablet (10 mg total) by mouth 2 (two) times daily as needed for muscle spasms. Patient not taking: Reported on 07/09/2014 09/27/13   Marlon Peliffany Greene, PA-C  diclofenac sodium (VOLTAREN) 1 % GEL Apply 4 g topically 4 (four) times daily. To knee and shoulder Patient not taking: Reported on 07/09/2014 03/14/14   Linna HoffJames D Pate Aylward, MD  HYDROcodone-acetaminophen [facility](NORCO) 5-325 MG per tablet  Take 1 tablet by mouth every 6 (six) hours as needed for severe pain. 07/09/14   Doug SouSam Jacubowitz, MD  traMADol (ULTRAM) 50 MG tablet Take 1 tablet (50 mg total) by mouth every 6 (six) hours as needed. Patient not taking: Reported on 07/09/2014 09/27/13   Marlon Peliffany Greene, PA-C   Meds Ordered and Administered this Visit  Medications - No data to display  BP 129/98 mmHg  Pulse 89  Temp(Src) 98.4 F (36.9 C) (Oral)  SpO2 98%  LMP 07/01/2015 (Exact Date) No data found.   Physical Exam  Constitutional: She is oriented to person, place, and time. She appears well-developed and well-nourished. No distress.  Abdominal: Soft. Bowel sounds are normal. There is no tenderness.  Neurological: She is alert and oriented to person, place, and time.  Skin: Skin is  warm and dry.  Nursing note and vitals reviewed.   ED Course  Procedures (including critical care time)  Labs Review Labs Reviewed  POCT URINALYSIS DIP (DEVICE) - Abnormal; Notable for the following:    Glucose, UA 250 (*)    Bilirubin Urine LARGE (*)    Ketones, ur 15 (*)    Hgb urine dipstick LARGE (*)    Protein, ur >=300 (*)    Urobilinogen, UA 2.0 (*)    Leukocytes, UA LARGE (*)    All other components within normal limits  URINE CULTURE  POCT PREGNANCY, URINE   upreg neg.  Imaging Review No results found.   Visual Acuity Review  Right Eye Distance:   Left Eye Distance:   Bilateral Distance:    Right Eye Near:   Left Eye Near:    Bilateral Near:         MDM   1. Pelvic pain in female        Linna HoffJames D Morocco Gipe, MD 07/02/15 1606

## 2015-07-02 NOTE — Discharge Instructions (Signed)
[facility] pain.

## 2015-07-03 LAB — URINE CULTURE: SPECIAL REQUESTS: NORMAL

## 2017-01-31 IMAGING — DX DG ANKLE COMPLETE 3+V*R*
3 series · 3 of 3 positions shown · non-contrast
Comparison: 10/29/2010

CLINICAL DATA: Twisted right ankle yesterday.  Swelling and pain.

EXAM:
RIGHT ANKLE - COMPLETE 3+ VIEW

[x ankle ap right]
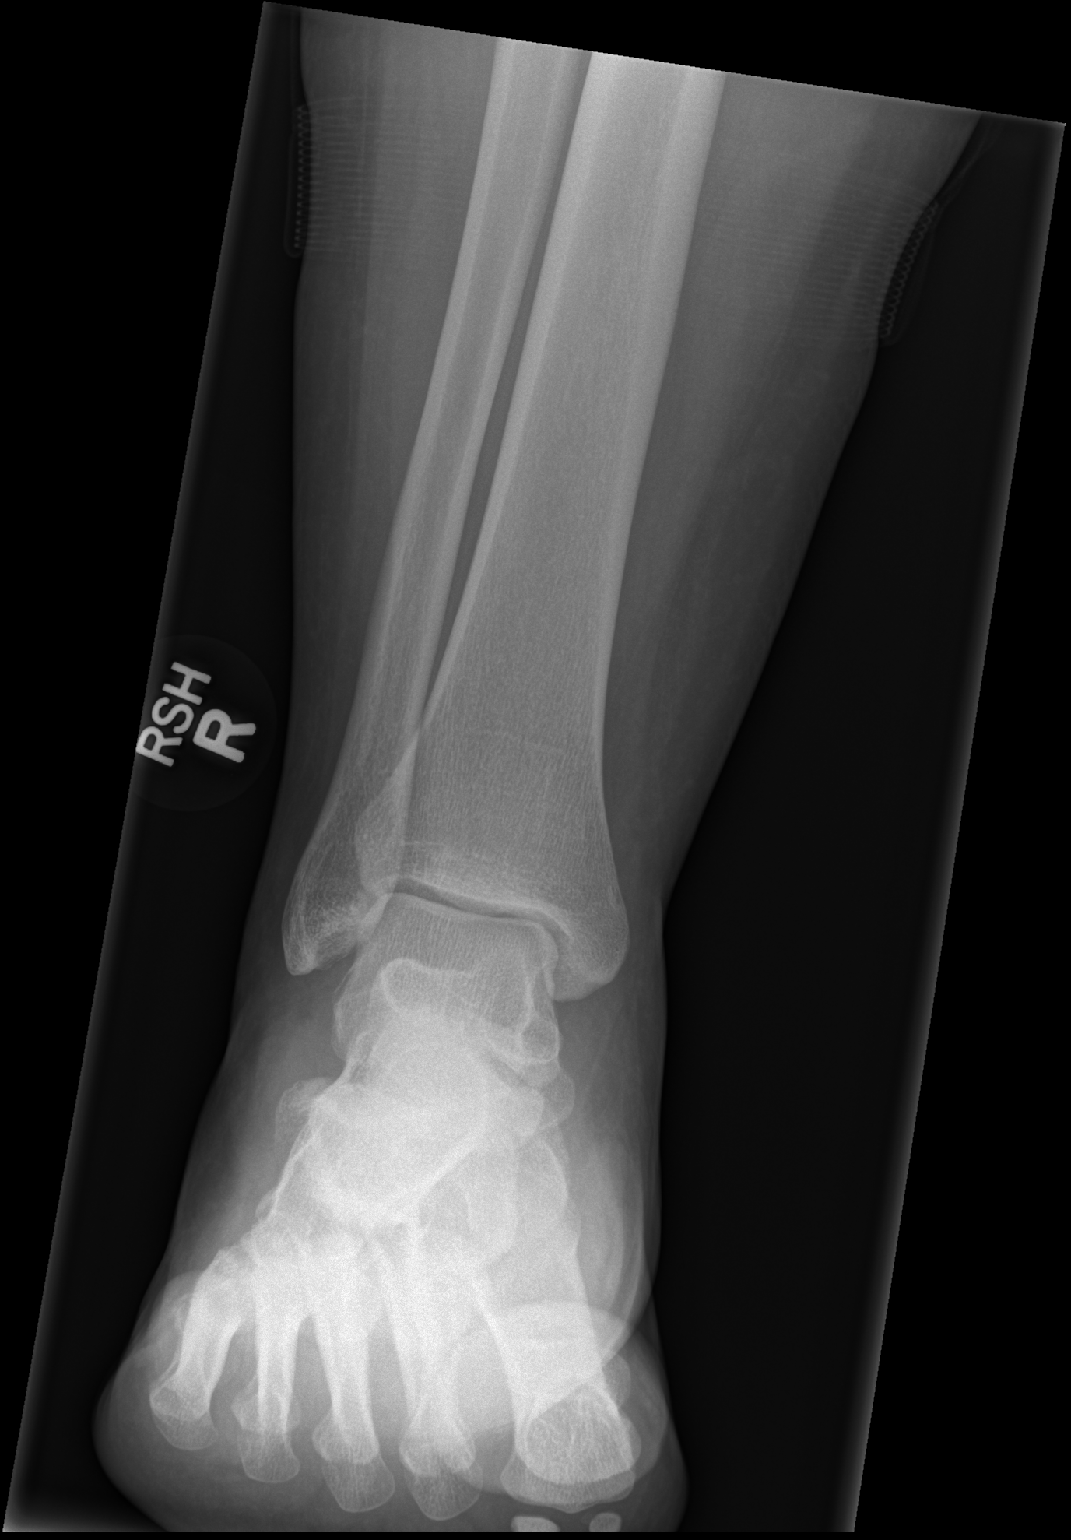

[x ankle obl right]
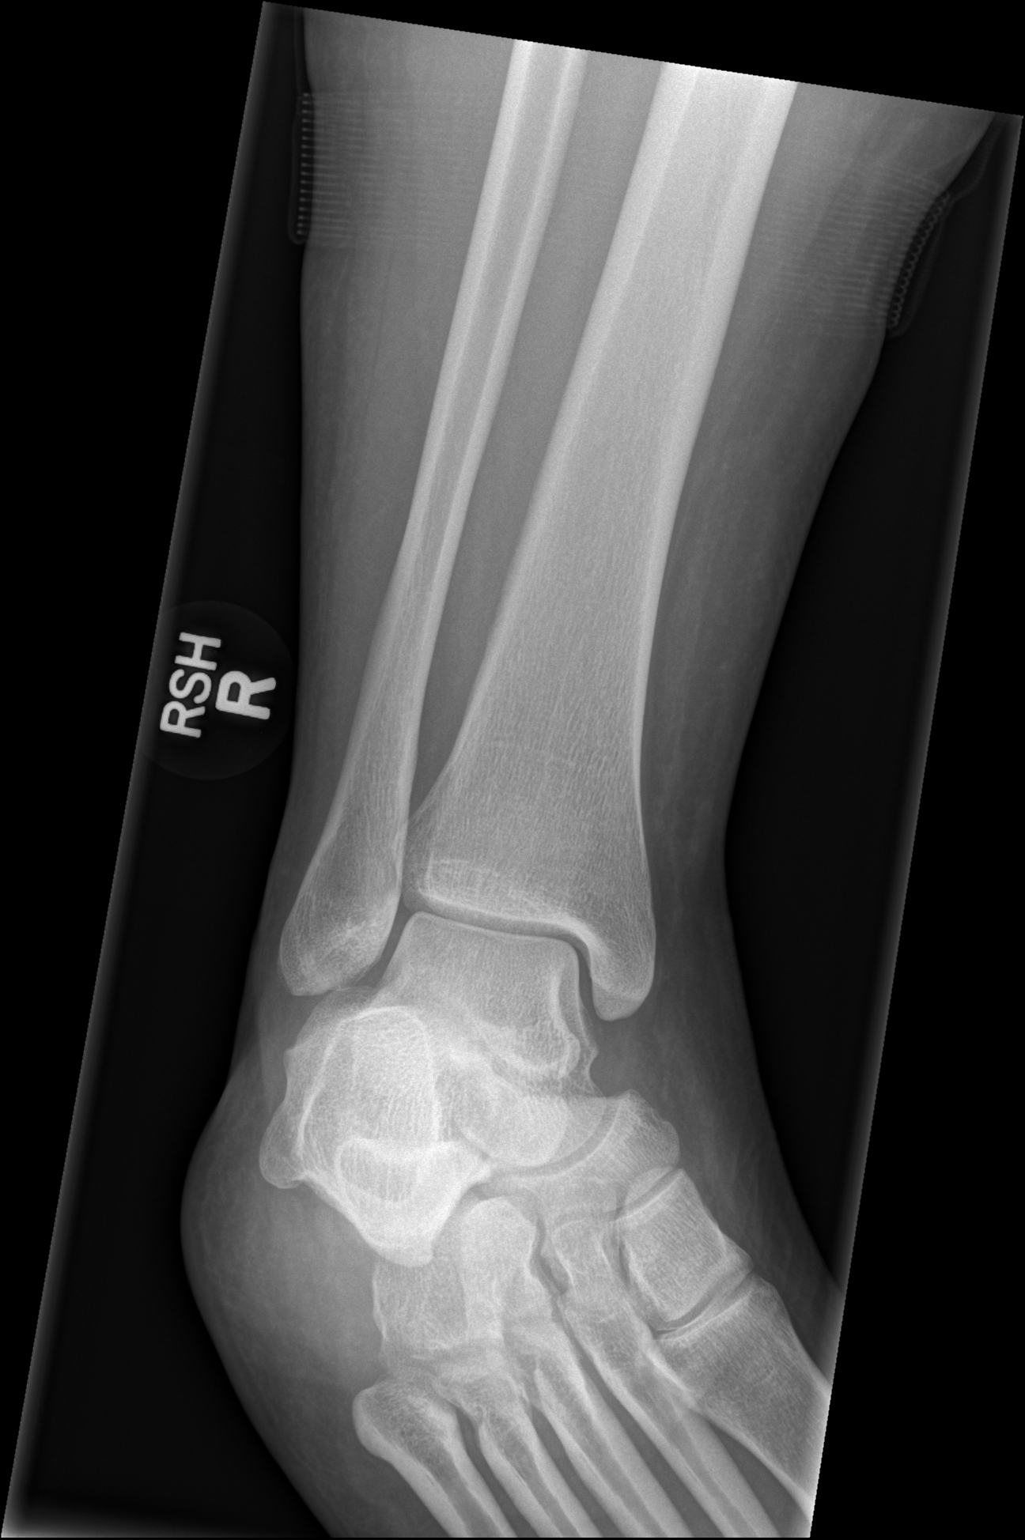

[x ankle lat right]
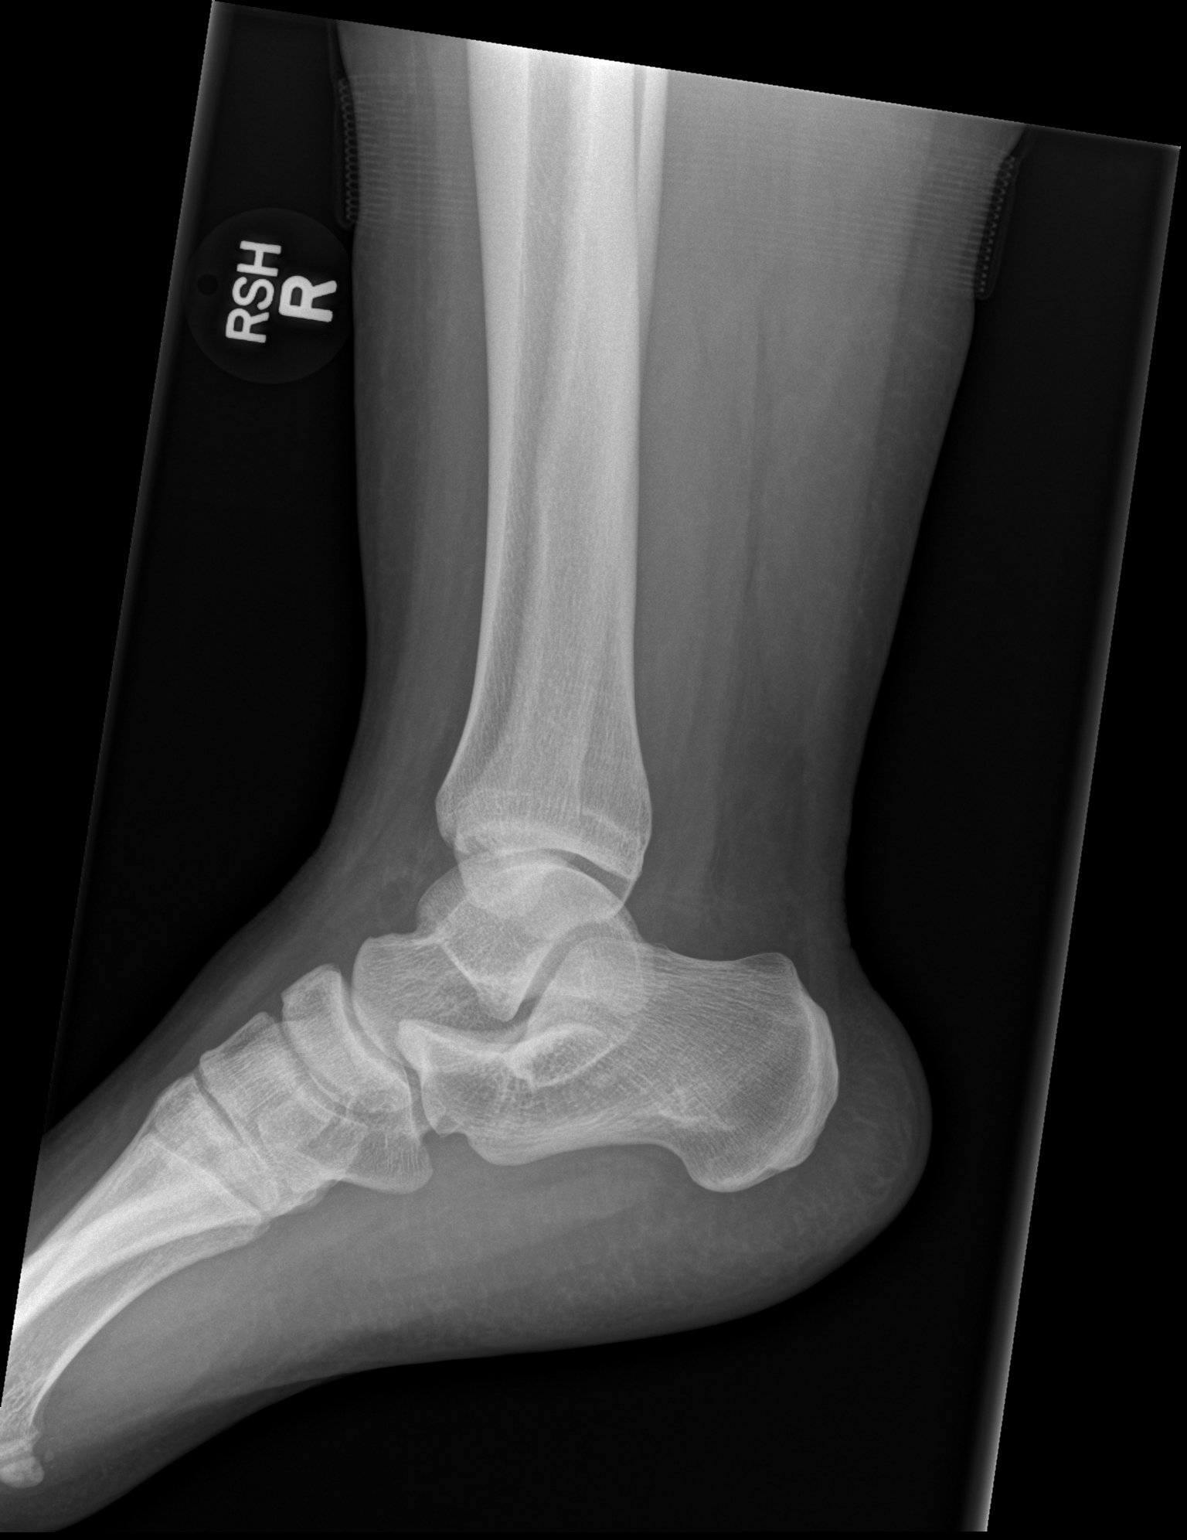

[3 of 3 positions shown; findings below may reference images not displayed]

FINDINGS: There is no evidence of fracture, dislocation, or joint effusion.
There is no evidence of arthropathy or other focal bone abnormality.
Soft tissues are unremarkable.
IMPRESSION: Negative.

## 2018-01-13 ENCOUNTER — Other Ambulatory Visit: Payer: Self-pay

## 2018-01-13 ENCOUNTER — Encounter (HOSPITAL_COMMUNITY): Payer: Self-pay | Admitting: Emergency Medicine

## 2018-01-13 ENCOUNTER — Emergency Department (HOSPITAL_COMMUNITY)
Admission: EM | Admit: 2018-01-13 | Discharge: 2018-01-13 | Disposition: A | Discharge status: Home / Self Care | Payer: Self-pay | Attending: Emergency Medicine | Admitting: Emergency Medicine

## 2018-01-13 DIAGNOSIS — I10 Essential (primary) hypertension: Secondary | ICD-10-CM | POA: Insufficient documentation

## 2018-01-13 DIAGNOSIS — Z113 Encounter for screening for infections with a predominantly sexual mode of transmission: Secondary | ICD-10-CM | POA: Insufficient documentation

## 2018-01-13 DIAGNOSIS — Z79899 Other long term (current) drug therapy: Secondary | ICD-10-CM | POA: Insufficient documentation

## 2018-01-13 DIAGNOSIS — N898 Other specified noninflammatory disorders of vagina: Secondary | ICD-10-CM | POA: Insufficient documentation

## 2018-01-13 DIAGNOSIS — Z87891 Personal history of nicotine dependence: Secondary | ICD-10-CM | POA: Insufficient documentation

## 2018-01-13 LAB — URINALYSIS, ROUTINE W REFLEX MICROSCOPIC
Bilirubin Urine: NEGATIVE
GLUCOSE, UA: NEGATIVE mg/dL
Hgb urine dipstick: NEGATIVE
KETONES UR: NEGATIVE mg/dL
Nitrite: NEGATIVE
PH: 6 (ref 5.0–8.0)
PROTEIN: NEGATIVE mg/dL
Specific Gravity, Urine: 1.013 (ref 1.005–1.030)

## 2018-01-13 LAB — WET PREP, GENITAL
Clue Cells Wet Prep HPF POC: NONE SEEN
SPERM: NONE SEEN
Trich, Wet Prep: NONE SEEN
Yeast Wet Prep HPF POC: NONE SEEN

## 2018-01-13 LAB — PREGNANCY, URINE: Preg Test, Ur: NEGATIVE

## 2018-01-13 MED ORDER — AZITHROMYCIN 250 MG PO TABS
1000.0000 mg | ORAL_TABLET | Freq: Once | ORAL | Status: AC
  Administered 2018-01-13: 1000 mg via ORAL
  Filled 2018-01-13: qty 4

## 2018-01-13 MED ORDER — LIDOCAINE HCL 1 % IJ SOLN
INTRAMUSCULAR | Status: AC
  Administered 2018-01-13: 0.9 mL
  Filled 2018-01-13: qty 20

## 2018-01-13 MED ORDER — CEFTRIAXONE SODIUM 250 MG IJ SOLR
250.0000 mg | Freq: Once | INTRAMUSCULAR | Status: AC
  Administered 2018-01-13: 250 mg via INTRAMUSCULAR
  Filled 2018-01-13: qty 250

## 2018-01-13 NOTE — ED Provider Notes (Signed)
[facility]-EMERGENCY DEPT Provider Note   CSN: [id number] Arrival date & time: 01/13/18  0404     History   Chief Complaint Chief Complaint  Patient presents with  . Vaginal Discharge    HPI Anita [name] is a 28 y.o. female.  Patient presents to the emergency department with a chief complaint of vaginal discharge.  She states that the symptoms started prior to her last period, and she believed it to be yeast infection.  She states that this is not uncommon for her.  She became more concerned when the symptoms persisted after the end of her period.  She denies any abdominal pain.  Denies any fevers, chills, nausea, or vomiting.  She would like to be tested for STDs.  She denies any new sexual contacts.  The history is provided by the patient. No language interpreter was used.    Past Medical History:  Diagnosis Date  . Arthritis   . Gout   . Hypertension     There are no active problems to display for this patient.   Past Surgical History:  Procedure Laterality Date  . KNEE SURGERY    . LEG SURGERY       OB History    Gravida  3   Para  2   Term  2   Preterm      AB  1   Living  2     SAB      TAB      Ectopic      Multiple      Live Births  1            Home Medications    Prior to Admission medications   Medication Sig Start Date End Date Taking? Authorizing Provider  albuterol (PROVENTIL HFA;VENTOLIN HFA) 108 (90 BASE) MCG/ACT inhaler Inhale 2 puffs into the lungs every 6 (six) hours as needed for wheezing or shortness of breath.    [provider]  cyclobenzaprine (FLEXERIL) 10 MG tablet Take 1 tablet (10 mg total) by mouth 2 (two) times daily as needed for muscle spasms. Patient not taking: Reported on 07/09/2014 09/27/13   Marlon Pel, PA-C  diclofenac sodium (VOLTAREN) 1 % GEL Apply 4 g topically 4 (four) times daily. To knee and shoulder Patient not taking: Reported on 07/09/2014 03/14/14   Linna Hoff, MD  HYDROcodone-acetaminophen (NORCO) 5-325 MG per tablet Take 1 tablet by mouth every 6 (six) hours as needed for severe pain. 07/09/14   Doug Sou, MD  traMADol (ULTRAM) 50 MG tablet Take 1 tablet (50 mg total) by mouth every 6 (six) hours as needed. Patient not taking: Reported on 07/09/2014 09/27/13   Marlon Pel, PA-C    Family History Family History  Problem Relation Age of Onset  . Alcohol abuse Neg Hx   . Arthritis Neg Hx   . Asthma Neg Hx   . Birth defects Neg Hx   . Cancer Neg Hx   . COPD Neg Hx   . Depression Neg Hx   . Diabetes Neg Hx   . Drug abuse Neg Hx   . Early death Neg Hx   . Hearing loss Neg Hx   . Heart disease Neg Hx   . Hyperlipidemia Neg Hx   . Hypertension Neg Hx   . Kidney disease Neg Hx   . Learning disabilities Neg Hx   . Mental illness Neg Hx   . Mental retardation Neg Hx   .  Miscarriages / Stillbirths Neg Hx   . Stroke Neg Hx   . Vision loss Neg Hx     Social History Social History   Tobacco Use  . Smoking status: Former Smoker    Types: Cigarettes  . Smokeless tobacco: Never Used  Substance Use Topics  . Alcohol use: No  . Drug use: No     Allergies   Patient has no known allergies.   Review of Systems Review of Systems  All other systems reviewed and are negative.    Physical Exam Updated Vital Signs BP (!) 187/130 (BP Location: Left Arm)   Pulse 97   Temp 97.8 F (36.6 C) (Oral)   Resp 18   SpO2 100%   Physical Exam Vitals signs and nursing note reviewed.  Constitutional:      General: She is not in acute distress.    Appearance: She is not diaphoretic.  HENT:     Head: Normocephalic and atraumatic.  Eyes:     Conjunctiva/sclera: Conjunctivae normal.     Pupils: Pupils are equal, round, and reactive to light.  Neck:     Trachea: No tracheal deviation.  Cardiovascular:     Rate and Rhythm: Normal rate.  Pulmonary:     Effort: Pulmonary effort is normal. No respiratory distress.  Abdominal:      Palpations: Abdomen is soft.  Genitourinary:    Comments: Moderate vaginal discharge, no bleeding  Chaperone present for pelvic exam Musculoskeletal: Normal range of motion.  Skin:    General: Skin is warm and dry.  Neurological:     Mental Status: She is alert and oriented to person, place, and time.  Psychiatric:        Judgment: Judgment normal.      ED Treatments / Results  Labs (all labs ordered are listed, but only abnormal results are displayed) Labs Reviewed  WET PREP, GENITAL - Abnormal; Notable for the following components:      Result Value   WBC, Wet Prep HPF POC FEW (*)    All other components within normal limits  URINALYSIS, ROUTINE W REFLEX MICROSCOPIC - Abnormal; Notable for the following components:   Color, Urine STRAW (*)    Leukocytes, UA MODERATE (*)    Bacteria, UA RARE (*)    All other components within normal limits  PREGNANCY, URINE  GC/CHLAMYDIA PROBE AMP ([place]) NOT AT [facility]    EKG None  Radiology No results found.  Procedures Procedures (including critical care time)  Medications Ordered in ED Medications - No data to display   Initial Impression / Assessment and Plan / ED Course  I have reviewed the triage vital signs and the nursing notes.  Pertinent labs & imaging results that were available during my care of the patient were reviewed by me and considered in my medical decision making (see chart for details).     No yeast or clue cells.  Will cover for STDs.  No abdominal pain.  VSS.  Final Clinical Impressions(s) / ED Diagnoses   Final diagnoses:  Vaginal discharge    ED Discharge Orders    None       Roxy Horseman, PA-C 01/13/18 8616    Palumbo, April, MD 01/13/18 [phone]

## 2018-01-13 NOTE — ED Triage Notes (Signed)
Pt reports vaginal discharge possible yeast infection

## 2018-01-14 LAB — GC/CHLAMYDIA PROBE AMP (~~LOC~~) NOT AT ARMC
Chlamydia: NEGATIVE
Neisseria Gonorrhea: NEGATIVE

## 2018-04-17 ENCOUNTER — Other Ambulatory Visit: Payer: Self-pay

## 2018-04-17 ENCOUNTER — Encounter (HOSPITAL_COMMUNITY): Payer: Self-pay

## 2018-04-17 ENCOUNTER — Emergency Department (HOSPITAL_COMMUNITY)
Admission: EM | Admit: 2018-04-17 | Discharge: 2018-04-17 | Disposition: A | Discharge status: Home / Self Care | Payer: Self-pay | Attending: Emergency Medicine | Admitting: Emergency Medicine

## 2018-04-17 DIAGNOSIS — Z79899 Other long term (current) drug therapy: Secondary | ICD-10-CM | POA: Insufficient documentation

## 2018-04-17 DIAGNOSIS — L509 Urticaria, unspecified: Secondary | ICD-10-CM | POA: Insufficient documentation

## 2018-04-17 DIAGNOSIS — I10 Essential (primary) hypertension: Secondary | ICD-10-CM | POA: Insufficient documentation

## 2018-04-17 DIAGNOSIS — Z87891 Personal history of nicotine dependence: Secondary | ICD-10-CM | POA: Insufficient documentation

## 2018-04-17 MED ORDER — PREDNISONE 20 MG PO TABS: ORAL_TABLET | ORAL | 0 refills | Status: DC

## 2018-04-17 MED ORDER — PREDNISONE 20 MG PO TABS
60.0000 mg | ORAL_TABLET | Freq: Once | ORAL | Status: AC
  Administered 2018-04-17: 03:00:00 60 mg via ORAL
  Filled 2018-04-17: qty 3

## 2018-04-17 MED ORDER — HYDROCHLOROTHIAZIDE 25 MG PO TABS: 25.0000 mg | ORAL_TABLET | Freq: Every day | ORAL | 0 refills | Status: DC

## 2018-04-17 MED ORDER — DOXEPIN HCL 10 MG PO CAPS
10.0000 mg | ORAL_CAPSULE | Freq: Three times a day (TID) | ORAL | 0 refills | Status: DC
Start: 2018-04-17 — End: 2022-03-19

## 2018-04-17 MED ORDER — DOXEPIN HCL 10 MG PO CAPS
10.0000 mg | ORAL_CAPSULE | Freq: Once | ORAL | Status: AC
  Administered 2018-04-17: 04:00:00 10 mg via ORAL
  Filled 2018-04-17: qty 1

## 2018-04-17 NOTE — ED Provider Notes (Signed)
[facility]   CSN: [id number] Arrival date & time: 04/17/18  0236    History   Chief Complaint Chief Complaint  Patient presents with  . Pruritis    HPI Ricci BarkerZarina N Ripple is a 28 y.o. female.  The history is provided by the patient.  She has history of hypertension and comes in because of an itchy rash which is been present for the last 2 days.  She thinks it might be because of bleach which is being sprayed where she works.  This is being sprayed to try to disinfect because of the pandemic virus.  Rash bothers her more at night.  It is very pruritic.  Diphenhydramine does give temporary relief.  She denies any difficulty breathing or swallowing.  Past Medical History:  Diagnosis Date  . Arthritis   . Gout   . Hypertension     There are no active problems to display for this patient.   Past Surgical History:  Procedure Laterality Date  . KNEE SURGERY    . LEG SURGERY       OB History    Gravida  3   Para  2   Term  2   Preterm      AB  1   Living  2     SAB      TAB      Ectopic      Multiple      Live Births  1            Home Medications    Prior to Admission medications   Medication Sig Start Date End Date Taking? Authorizing Provider  doxepin (SINEQUAN) 10 MG capsule Take 1 capsule (10 mg total) by mouth 3 (three) times daily. 04/17/18   Dione BoozeGlick, Jamiah Recore, MD  hydrochlorothiazide (HYDRODIURIL) 25 MG tablet Take 1 tablet (25 mg total) by mouth daily. 04/17/18   Dione BoozeGlick, Jisel Fleet, MD  predniSONE (DELTASONE) 20 MG tablet 2 tabs po daily x 4 days 04/17/18   Dione BoozeGlick, Khamauri Bauernfeind, MD    Family History Family History  Problem Relation Age of Onset  . Alcohol abuse Neg Hx   . Arthritis Neg Hx   . Asthma Neg Hx   . Birth defects Neg Hx   . Cancer Neg Hx   . COPD Neg Hx   . Depression Neg Hx   . Diabetes Neg Hx   . Drug abuse Neg Hx   . Early death Neg Hx   . Hearing loss Neg Hx   . Heart disease Neg Hx    . Hyperlipidemia Neg Hx   . Hypertension Neg Hx   . Kidney disease Neg Hx   . Learning disabilities Neg Hx   . Mental illness Neg Hx   . Mental retardation Neg Hx   . Miscarriages / Stillbirths Neg Hx   . Stroke Neg Hx   . Vision loss Neg Hx     Social History Social History   Tobacco Use  . Smoking status: Former Smoker    Types: Cigarettes  . Smokeless tobacco: Never Used  Substance Use Topics  . Alcohol use: No  . Drug use: No     Allergies   Patient has no known allergies.   Review of Systems Review of Systems  All other systems reviewed and are negative.    Physical Exam Updated Vital Signs BP (!) 180/131 (BP Location: Right Arm)   Pulse 90   Temp 97.8 F (  36.6 C) (Oral)   Resp (!) 21   SpO2 99%   Physical Exam Vitals signs and nursing note reviewed.    28 year old female, resting comfortably and in no acute distress. Vital signs are significant for markedly elevated blood pressure. Oxygen saturation is 99%, which is normal. Head is normocephalic and atraumatic. PERRLA, EOMI. Oropharynx shows minimal edema of the uvula.  There is no pooling of secretions and phonation is normal. Neck is nontender and supple without adenopathy or JVD. Back is nontender and there is no CVA tenderness. Lungs are clear without rales, wheezes, or rhonchi. Chest is nontender. Heart has regular rate and rhythm without murmur. Abdomen is soft, flat, nontender without masses or hepatosplenomegaly and peristalsis is normoactive. Extremities have trace edema, full range of motion is present. Skin has a rash which shows areas of erythematous papules as well as areas of urticaria.  Significant scratch marks and excoriation are present. Neurologic: Mental status is normal, cranial nerves are intact, there are no motor or sensory deficits.  ED Treatments / Results   Procedures Procedures  Medications Ordered in ED Medications  doxepin (SINEQUAN) capsule 10 mg (has no  administration in time range)  predniSONE (DELTASONE) tablet 60 mg (60 mg Oral Given 04/17/18 0306)     Initial Impression / Assessment and Plan / ED Course  I have reviewed the triage vital signs and the nursing notes.  Allergic reaction with urticaria.  Markedly elevated blood pressure.  Old records are reviewed, and she had a similarly elevated blood pressure at an ED visit on January 13, 2018.  She is not on any medication for blood pressure.  She is given a dose of prednisone, doxepin in the ED and is discharged with prescriptions for both as well as prescription for hydrochlorothiazide.  Patient is encouraged to monitor her blood pressure at home.  Importance of getting a primary care provider was stressed and she was given a resource guide to assist her.  Advised she may continue to use diphenhydramine as needed for relief from itching.  Final Clinical Impressions(s) / ED Diagnoses   Final diagnoses:  Urticaria  Essential hypertension    ED Discharge Orders         Ordered    hydrochlorothiazide (HYDRODIURIL) 25 MG tablet  Daily     04/17/18 0307    predniSONE (DELTASONE) 20 MG tablet     04/17/18 0307    doxepin (SINEQUAN) 10 MG capsule  3 times daily     04/17/18 0307           Dione Booze, MD 04/17/18 [phone]

## 2018-04-17 NOTE — Discharge Instructions (Addendum)
You may continue to take diphenhydramine (Benadryl) as needed for itching.  Your blood pressure was very high today.  You are being started on medication for your blood pressure.  Please monitor your blood pressure at home.  You should take your blood pressure every day and keep a record of it.  When you see your primary care provider, take that record with you.  Please take your hypertension seriously.  Untreated hypertension can lead to heart attacks, strokes, kidney failure.  Please consider nonmedication things that she can do to help your blood pressure.  These include weight loss, low-salt diet, regular exercise.  Use the website goodrx.com to get coupons to lower the cost of your prescriptions.

## 2018-04-17 NOTE — ED Triage Notes (Signed)
Pt here for possible contact dermatitis.  Pt states symptoms better after benadryl.  Pt denies SOB.

## 2022-01-02 DIAGNOSIS — Z20822 Contact with and (suspected) exposure to covid-19: Secondary | ICD-10-CM | POA: Diagnosis not present

## 2022-01-02 DIAGNOSIS — I10 Essential (primary) hypertension: Secondary | ICD-10-CM | POA: Diagnosis not present

## 2022-01-04 DIAGNOSIS — I10 Essential (primary) hypertension: Secondary | ICD-10-CM | POA: Diagnosis not present

## 2022-01-04 DIAGNOSIS — R519 Headache, unspecified: Secondary | ICD-10-CM | POA: Diagnosis not present

## 2022-01-04 DIAGNOSIS — Z20822 Contact with and (suspected) exposure to covid-19: Secondary | ICD-10-CM | POA: Diagnosis not present

## 2022-01-04 DIAGNOSIS — J029 Acute pharyngitis, unspecified: Secondary | ICD-10-CM | POA: Diagnosis not present

## 2022-03-18 ENCOUNTER — Emergency Department (HOSPITAL_COMMUNITY): Payer: Medicaid Other

## 2022-03-18 ENCOUNTER — Encounter (HOSPITAL_COMMUNITY): Payer: Self-pay | Admitting: Emergency Medicine

## 2022-03-18 ENCOUNTER — Inpatient Hospital Stay (HOSPITAL_COMMUNITY)
Admission: EM | Admit: 2022-03-18 | Discharge: 2022-03-21 | DRG: 305 | Disposition: A | Discharge status: Home / Self Care | Payer: Medicaid Other | Attending: Family Medicine | Admitting: Family Medicine

## 2022-03-18 ENCOUNTER — Other Ambulatory Visit: Payer: Self-pay

## 2022-03-18 DIAGNOSIS — D509 Iron deficiency anemia, unspecified: Secondary | ICD-10-CM

## 2022-03-18 DIAGNOSIS — I161 Hypertensive emergency: Principal | ICD-10-CM | POA: Diagnosis present

## 2022-03-18 DIAGNOSIS — S3993XA Unspecified injury of pelvis, initial encounter: Secondary | ICD-10-CM | POA: Diagnosis not present

## 2022-03-18 DIAGNOSIS — Z79899 Other long term (current) drug therapy: Secondary | ICD-10-CM

## 2022-03-18 DIAGNOSIS — N920 Excessive and frequent menstruation with regular cycle: Secondary | ICD-10-CM | POA: Diagnosis present

## 2022-03-18 DIAGNOSIS — Z7952 Long term (current) use of systemic steroids: Secondary | ICD-10-CM

## 2022-03-18 DIAGNOSIS — Z6836 Body mass index (BMI) 36.0-36.9, adult: Secondary | ICD-10-CM

## 2022-03-18 DIAGNOSIS — S6992XA Unspecified injury of left wrist, hand and finger(s), initial encounter: Secondary | ICD-10-CM | POA: Diagnosis not present

## 2022-03-18 DIAGNOSIS — S0181XA Laceration without foreign body of other part of head, initial encounter: Secondary | ICD-10-CM | POA: Diagnosis present

## 2022-03-18 DIAGNOSIS — R55 Syncope and collapse: Secondary | ICD-10-CM | POA: Diagnosis not present

## 2022-03-18 DIAGNOSIS — D649 Anemia, unspecified: Secondary | ICD-10-CM | POA: Diagnosis not present

## 2022-03-18 DIAGNOSIS — I517 Cardiomegaly: Secondary | ICD-10-CM

## 2022-03-18 DIAGNOSIS — Z8249 Family history of ischemic heart disease and other diseases of the circulatory system: Secondary | ICD-10-CM

## 2022-03-18 DIAGNOSIS — W19XXXA Unspecified fall, initial encounter: Secondary | ICD-10-CM | POA: Diagnosis not present

## 2022-03-18 DIAGNOSIS — I10 Essential (primary) hypertension: Secondary | ICD-10-CM | POA: Diagnosis not present

## 2022-03-18 DIAGNOSIS — M109 Gout, unspecified: Secondary | ICD-10-CM | POA: Diagnosis present

## 2022-03-18 DIAGNOSIS — E876 Hypokalemia: Secondary | ICD-10-CM | POA: Diagnosis not present

## 2022-03-18 DIAGNOSIS — E669 Obesity, unspecified: Secondary | ICD-10-CM | POA: Diagnosis present

## 2022-03-18 DIAGNOSIS — Z87891 Personal history of nicotine dependence: Secondary | ICD-10-CM

## 2022-03-18 DIAGNOSIS — R0902 Hypoxemia: Secondary | ICD-10-CM | POA: Diagnosis not present

## 2022-03-18 DIAGNOSIS — R0602 Shortness of breath: Secondary | ICD-10-CM | POA: Diagnosis not present

## 2022-03-18 DIAGNOSIS — R7989 Other specified abnormal findings of blood chemistry: Secondary | ICD-10-CM

## 2022-03-18 DIAGNOSIS — Z23 Encounter for immunization: Secondary | ICD-10-CM

## 2022-03-18 DIAGNOSIS — Z91148 Patient's other noncompliance with medication regimen for other reason: Secondary | ICD-10-CM

## 2022-03-18 LAB — CBC WITH DIFFERENTIAL/PLATELET
Abs Immature Granulocytes: 0.03 10*3/uL (ref 0.00–0.07)
Basophils Absolute: 0 10*3/uL (ref 0.0–0.1)
Basophils Relative: 0 %
Eosinophils Absolute: 0 10*3/uL (ref 0.0–0.5)
Eosinophils Relative: 0 %
HCT: 28.9 % — ABNORMAL LOW (ref 36.0–46.0)
Hemoglobin: 7.6 g/dL — ABNORMAL LOW (ref 12.0–15.0)
Immature Granulocytes: 0 %
Lymphocytes Relative: 16 %
Lymphs Abs: 1.5 10*3/uL (ref 0.7–4.0)
MCH: 18 pg — ABNORMAL LOW (ref 26.0–34.0)
MCHC: 26.3 g/dL — ABNORMAL LOW (ref 30.0–36.0)
MCV: 68.5 fL — ABNORMAL LOW (ref 80.0–100.0)
Monocytes Absolute: 0.3 10*3/uL (ref 0.1–1.0)
Monocytes Relative: 3 %
Neutro Abs: 7.5 10*3/uL (ref 1.7–7.7)
Neutrophils Relative %: 81 %
Platelets: 190 10*3/uL (ref 150–400)
RBC: 4.22 MIL/uL (ref 3.87–5.11)
RDW: 20.7 % — ABNORMAL HIGH (ref 11.5–15.5)
WBC: 9.4 10*3/uL (ref 4.0–10.5)
nRBC: 0.2 % (ref 0.0–0.2)

## 2022-03-18 LAB — COMPREHENSIVE METABOLIC PANEL
ALT: 21 U/L (ref 0–44)
AST: 24 U/L (ref 15–41)
Albumin: 4.1 g/dL (ref 3.5–5.0)
Alkaline Phosphatase: 65 U/L (ref 38–126)
Anion gap: 8 (ref 5–15)
BUN: 23 mg/dL — ABNORMAL HIGH (ref 6–20)
CO2: 24 mmol/L (ref 22–32)
Calcium: 8.5 mg/dL — ABNORMAL LOW (ref 8.9–10.3)
Chloride: 102 mmol/L (ref 98–111)
Creatinine, Ser: 1.22 mg/dL — ABNORMAL HIGH (ref 0.44–1.00)
GFR, Estimated: >60 mL/min (ref >60)
Glucose, Bld: 93 mg/dL (ref 70–99)
Potassium: 3.7 mmol/L (ref 3.5–5.1)
Sodium: 134 mmol/L — ABNORMAL LOW (ref 135–145)
Total Bilirubin: 0.8 mg/dL (ref 0.3–1.2)
Total Protein: 7.6 g/dL (ref 6.5–8.1)

## 2022-03-18 LAB — CBG MONITORING, ED: Glucose-Capillary: 92 mg/dL (ref 70–99)

## 2022-03-18 LAB — BRAIN NATRIURETIC PEPTIDE: B Natriuretic Peptide: 283 pg/mL — ABNORMAL HIGH (ref 0.0–100.0)

## 2022-03-18 LAB — TROPONIN I (HIGH SENSITIVITY): Troponin I (High Sensitivity): 68 ng/L — ABNORMAL HIGH (ref <18)

## 2022-03-18 LAB — HCG, QUANTITATIVE, PREGNANCY: hCG, Beta Chain, Quant, S: <1 m[IU]/mL (ref <5)

## 2022-03-18 MED ORDER — FUROSEMIDE 10 MG/ML IJ SOLN
20.0000 mg | Freq: Once | INTRAMUSCULAR | Status: AC
  Administered 2022-03-19: 20 mg via INTRAVENOUS
  Filled 2022-03-18: qty 4

## 2022-03-18 MED ORDER — LABETALOL HCL 5 MG/ML IV SOLN
0.5000 mg/min | Status: DC
  Administered 2022-03-19: 0.5 mg/min via INTRAVENOUS
  Filled 2022-03-18 (×2): qty 80

## 2022-03-18 MED ORDER — LIDOCAINE-EPINEPHRINE-TETRACAINE (LET) TOPICAL GEL
3.0000 mL | Freq: Once | TOPICAL | Status: AC
  Administered 2022-03-18: 3 mL via TOPICAL
  Filled 2022-03-18: qty 3

## 2022-03-18 MED ORDER — LABETALOL HCL 5 MG/ML IV SOLN
10.0000 mg | Freq: Once | INTRAVENOUS | Status: AC
  Administered 2022-03-18: 10 mg via INTRAVENOUS
  Filled 2022-03-18: qty 4

## 2022-03-18 MED ORDER — LACTATED RINGERS IV BOLUS
1000.0000 mL | Freq: Once | INTRAVENOUS | Status: AC
  Administered 2022-03-18: 1000 mL via INTRAVENOUS

## 2022-03-18 MED ORDER — IBUPROFEN 200 MG PO TABS
600.0000 mg | ORAL_TABLET | Freq: Once | ORAL | Status: AC
  Administered 2022-03-18: 600 mg via ORAL
  Filled 2022-03-18: qty 3

## 2022-03-18 MED ORDER — LABETALOL HCL 5 MG/ML IV SOLN: 20.0000 mg | Freq: Once | INTRAVENOUS | Status: DC

## 2022-03-18 MED ORDER — LIDOCAINE-EPINEPHRINE 2 %-1:100000 IJ SOLN
10.0000 mL | Freq: Once | INTRAMUSCULAR | Status: AC
  Administered 2022-03-18: 10 mL
  Filled 2022-03-18: qty 1

## 2022-03-18 MED ORDER — IOHEXOL 350 MG/ML SOLN
100.0000 mL | Freq: Once | INTRAVENOUS | Status: AC | PRN
  Administered 2022-03-18: 75 mL via INTRAVENOUS

## 2022-03-18 MED ORDER — TETANUS-DIPHTH-ACELL PERTUSSIS 5-2.5-18.5 LF-MCG/0.5 IM SUSY
0.5000 mL | PREFILLED_SYRINGE | Freq: Once | INTRAMUSCULAR | Status: AC
  Administered 2022-03-18: 0.5 mL via INTRAMUSCULAR
  Filled 2022-03-18: qty 0.5

## 2022-03-18 MED ORDER — HYDROCHLOROTHIAZIDE 12.5 MG PO TABS
25.0000 mg | ORAL_TABLET | Freq: Once | ORAL | Status: AC
  Administered 2022-03-18: 25 mg via ORAL
  Filled 2022-03-18: qty 2

## 2022-03-18 NOTE — ED Notes (Signed)
Pt going to XR

## 2022-03-18 NOTE — ED Triage Notes (Signed)
Pt arriving via [facility] from home. Pt was standing on her balcony and had episode of syncope. Pt has abrasion on her chin. Also complaining of bilateral hip pain.A&O x4.

## 2022-03-18 NOTE — ED Provider Notes (Incomplete)
[place] AT [facility] Provider Note   CSN: [id number] Arrival date & time: 03/18/22  2037     History  Chief Complaint  Patient presents with   Loss of Consciousness   Hip Pain    Bilateral    Anita [name] is a 32 y.o. female.  With PMH of HTN, arthritis, gout who presents after syncopal episode.  Patient was laying down at home when she felt very unwell with having shortness of breath started coughing ran outside to get some air to cool down and the next thing she remembered she was on the ground.  Her children found her.  She did not have any incontinence and has no history of seizures.  She was not confused after the episode.  She is denying any active chest pain or shortness of breath now.  She did have a menstrual period a few days ago but is not currently having any bleeding.  She is noncompliant with her blood pressure medicines.  She never picked up her blood pressure medicines.  She has had 1 previous syncopal episode but has never been evaluated for it.  She is having some pain in her hips and in her chin where she fell on the ground.  She is not on any blood thinners.  She has no history of PE or DVT.  No recent surgical procedures.   Loss of Consciousness Hip Pain       Home Medications Prior to Admission medications   Medication Sig Start Date End Date Taking? Authorizing Provider  doxepin (SINEQUAN) 10 MG capsule Take 1 capsule (10 mg total) by mouth 3 (three) times daily. [id number]   Delora Fuel, MD  hydrochlorothiazide (HYDRODIURIL) 25 MG tablet Take 1 tablet (25 mg total) by mouth daily. [id number]   Delora Fuel, MD  predniSONE (DELTASONE) 20 MG tablet 2 tabs po daily x 4 days [id number]   Delora Fuel, MD      Allergies    Patient has no known allergies.    Review of Systems   Review of Systems  Cardiovascular:  Positive for syncope.    Physical Exam Updated Vital Signs BP (!) 207/145   Pulse 83   Temp 98.2 F (36.8 C)  (Oral)   Resp (!) 22   SpO2 94%  Physical Exam Constitutional: Alert and oriented. Pale appearing woman, GCS 15 Eyes: Conjunctivae are normal. ENT      Head: Normocephalic , 3.5 cm linear hemostatic inferion chin laceration      Nose: No congestion.      Mouth/Throat: No tongue or dental injuries Cardiovascular: S1, S2,  Normal and symmetric distal pulses are present in all extremities.Warm and well perfused. Respiratory: Mildly tachypneic. Breath sounds are normal. O2 sat 100 on RA Gastrointestinal: Soft and nontender.  Musculoskeletal: Normal range of motion in all extremities. Trace pitting edema of b/l Les  Neurologic: Normal speech and language. No facial droop, Moving all extremities equally. Sensation grossly intact. No gross focal neurologic deficits are appreciated. Skin: Skin is warm, dry  Psychiatric: Mood and affect are normal. Speech and behavior are normal.  ED Results / Procedures / Treatments   Labs (all labs ordered are listed, but only abnormal results are displayed) Labs Reviewed  COMPREHENSIVE METABOLIC PANEL - Abnormal; Notable for the following components:      Result Value   Sodium 134 (*)    BUN 23 (*)    Creatinine, Ser 1.22 (*)    Calcium 8.5 (*)  All other components within normal limits  BRAIN NATRIURETIC PEPTIDE - Abnormal; Notable for the following components:   B Natriuretic Peptide 283.0 (*)    All other components within normal limits  CBC WITH DIFFERENTIAL/PLATELET - Abnormal; Notable for the following components:   Hemoglobin 7.6 (*)    HCT 28.9 (*)    MCV 68.5 (*)    MCH 18.0 (*)    MCHC 26.3 (*)    RDW 20.7 (*)    All other components within normal limits  TROPONIN I (HIGH SENSITIVITY) - Abnormal; Notable for the following components:   Troponin I (High Sensitivity) 68 (*)    All other components within normal limits  HCG, QUANTITATIVE, PREGNANCY  CBG MONITORING, ED  TROPONIN I (HIGH SENSITIVITY)    EKG EKG  Interpretation  Date/Time:  Wednesday March 18 2022 20:54:12 EDT Ventricular Rate:  88 PR Interval:  190 QRS Duration: 98 QT Interval:  376 QTC Calculation: 455 R Axis:   75 Text Interpretation: Sinus rhythm Probable left atrial enlargement RSR' in V1 or V2, probably normal variant Borderline T wave abnormalities inferior lateral leads no previous to compare to Confirmed by Georgina Snell [phone]) on 03/18/2022 9:08:17 PM  Radiology CT Head Wo Contrast  Result Date: 03/18/2022 CLINICAL DATA:  Syncopal episode with fall from standing and abrasion to chin EXAM: CT HEAD WITHOUT CONTRAST CT MAXILLOFACIAL WITHOUT CONTRAST CT CERVICAL SPINE WITHOUT CONTRAST TECHNIQUE: Multidetector CT imaging of the head, cervical spine, and maxillofacial structures were performed using the standard protocol without intravenous contrast. Multiplanar CT image reconstructions of the cervical spine and maxillofacial structures were also generated. RADIATION DOSE REDUCTION: This exam was performed according to the departmental dose-optimization program which includes automated exposure control, adjustment of the mA and/or kV according to patient size and/or use of iterative reconstruction technique. COMPARISON:  CT orbits 07/09/2014 and CT head 02/09/2003; cervical spine radiographs 09/26/2013 FINDINGS: CT HEAD FINDINGS Brain: No evidence of acute infarction, hemorrhage, hydrocephalus, extra-axial collection or mass lesion/mass effect. Vascular: No hyperdense vessel or unexpected calcification. Skull: Normal. Negative for fracture or focal lesion. Other: None. CT MAXILLOFACIAL FINDINGS Osseous: No fracture or mandibular dislocation. No destructive process. Dental caries in a left mandibular molar. Orbits: Negative. No traumatic or inflammatory finding. Sinuses: Clear. Soft tissues: Abrasion/contusion to the right chin. CT CERVICAL SPINE FINDINGS Alignment: No evidence of traumatic malalignment. Skull base and vertebrae: No  acute fracture. No primary bone lesion or focal pathologic process. Soft tissues and spinal canal: No prevertebral fluid or swelling. No visible canal hematoma. Disc levels: Disc space height is maintained. No spinal canal or neural foraminal narrowing. Upper chest: Negative. Other: None. IMPRESSION: 1. No acute intracranial abnormality. 2. No acute facial bone fracture. Abrasion/contusion to the right chin. 3. No acute cervical spine fracture. 4. Left mandibular molar dental carie. Electronically Signed   By: Placido Sou M.D.   On: 03/18/2022 23:38   CT Cervical Spine Wo Contrast  Result Date: 03/18/2022 CLINICAL DATA:  Syncopal episode with fall from standing and abrasion to chin EXAM: CT HEAD WITHOUT CONTRAST CT MAXILLOFACIAL WITHOUT CONTRAST CT CERVICAL SPINE WITHOUT CONTRAST TECHNIQUE: Multidetector CT imaging of the head, cervical spine, and maxillofacial structures were performed using the standard protocol without intravenous contrast. Multiplanar CT image reconstructions of the cervical spine and maxillofacial structures were also generated. RADIATION DOSE REDUCTION: This exam was performed according to the departmental dose-optimization program which includes automated exposure control, adjustment of the mA and/or kV according to patient size and/or use  of iterative reconstruction technique. COMPARISON:  CT orbits 07/09/2014 and CT head 02/09/2003; cervical spine radiographs 09/26/2013 FINDINGS: CT HEAD FINDINGS Brain: No evidence of acute infarction, hemorrhage, hydrocephalus, extra-axial collection or mass lesion/mass effect. Vascular: No hyperdense vessel or unexpected calcification. Skull: Normal. Negative for fracture or focal lesion. Other: None. CT MAXILLOFACIAL FINDINGS Osseous: No fracture or mandibular dislocation. No destructive process. Dental caries in a left mandibular molar. Orbits: Negative. No traumatic or inflammatory finding. Sinuses: Clear. Soft tissues: Abrasion/contusion to  the right chin. CT CERVICAL SPINE FINDINGS Alignment: No evidence of traumatic malalignment. Skull base and vertebrae: No acute fracture. No primary bone lesion or focal pathologic process. Soft tissues and spinal canal: No prevertebral fluid or swelling. No visible canal hematoma. Disc levels: Disc space height is maintained. No spinal canal or neural foraminal narrowing. Upper chest: Negative. Other: None. IMPRESSION: 1. No acute intracranial abnormality. 2. No acute facial bone fracture. Abrasion/contusion to the right chin. 3. No acute cervical spine fracture. 4. Left mandibular molar dental carie. Electronically Signed   By: Placido Sou M.D.   On: 03/18/2022 23:38   CT Maxillofacial Wo Contrast  Result Date: 03/18/2022 CLINICAL DATA:  Syncopal episode with fall from standing and abrasion to chin EXAM: CT HEAD WITHOUT CONTRAST CT MAXILLOFACIAL WITHOUT CONTRAST CT CERVICAL SPINE WITHOUT CONTRAST TECHNIQUE: Multidetector CT imaging of the head, cervical spine, and maxillofacial structures were performed using the standard protocol without intravenous contrast. Multiplanar CT image reconstructions of the cervical spine and maxillofacial structures were also generated. RADIATION DOSE REDUCTION: This exam was performed according to the departmental dose-optimization program which includes automated exposure control, adjustment of the mA and/or kV according to patient size and/or use of iterative reconstruction technique. COMPARISON:  CT orbits 07/09/2014 and CT head 02/09/2003; cervical spine radiographs 09/26/2013 FINDINGS: CT HEAD FINDINGS Brain: No evidence of acute infarction, hemorrhage, hydrocephalus, extra-axial collection or mass lesion/mass effect. Vascular: No hyperdense vessel or unexpected calcification. Skull: Normal. Negative for fracture or focal lesion. Other: None. CT MAXILLOFACIAL FINDINGS Osseous: No fracture or mandibular dislocation. No destructive process. Dental caries in a left  mandibular molar. Orbits: Negative. No traumatic or inflammatory finding. Sinuses: Clear. Soft tissues: Abrasion/contusion to the right chin. CT CERVICAL SPINE FINDINGS Alignment: No evidence of traumatic malalignment. Skull base and vertebrae: No acute fracture. No primary bone lesion or focal pathologic process. Soft tissues and spinal canal: No prevertebral fluid or swelling. No visible canal hematoma. Disc levels: Disc space height is maintained. No spinal canal or neural foraminal narrowing. Upper chest: Negative. Other: None. IMPRESSION: 1. No acute intracranial abnormality. 2. No acute facial bone fracture. Abrasion/contusion to the right chin. 3. No acute cervical spine fracture. 4. Left mandibular molar dental carie. Electronically Signed   By: Placido Sou M.D.   On: 03/18/2022 23:38   CT Angio Chest PE W and/or Wo Contrast  Result Date: 03/18/2022 CLINICAL DATA:  Syncope, shortness of breath. EXAM: CT ANGIOGRAPHY CHEST WITH CONTRAST TECHNIQUE: Multidetector CT imaging of the chest was performed using the standard protocol during bolus administration of intravenous contrast. Multiplanar CT image reconstructions and MIPs were obtained to evaluate the vascular anatomy. RADIATION DOSE REDUCTION: This exam was performed according to the departmental dose-optimization program which includes automated exposure control, adjustment of the mA and/or kV according to patient size and/or use of iterative reconstruction technique. CONTRAST:  59m OMNIPAQUE IOHEXOL 350 MG/ML SOLN COMPARISON:  None Available. FINDINGS: Cardiovascular: The heart is enlarged and there is a trace pericardial effusion. The aorta  is normal in caliber. The pulmonary trunk is mildly distended which may be associated with underlying pulmonary artery hypertension. No evidence of pulmonary embolism. Evaluation of the pulmonary arteries is limited due to respiratory motion artifact. Mediastinum/Nodes: No enlarged mediastinal, hilar, or  axillary lymph nodes. Thyroid gland, trachea, and esophagus demonstrate no significant findings. Lungs/Pleura: Mild hazy ground-glass attenuation is present in the lungs bilaterally. No consolidation, effusion, or pneumothorax. Upper Abdomen: No acute abnormality. Musculoskeletal: No acute osseous abnormality. Review of the MIP images confirms the above findings. IMPRESSION: 1. No evidence of pulmonary embolism. 2. Hazy ground-glass attenuation in the lungs, possible edema or pneumonitis. 3. Cardiomegaly. 4. Mildly distended pulmonary trunk which may be associated with underlying pulmonary artery hypertension. Electronically Signed   By: Brett Fairy M.D.   On: 03/18/2022 23:38   DG Pelvis 1-2 Views  Result Date: 03/18/2022 CLINICAL DATA:  fall injury EXAM: PELVIS - 1-2 VIEW COMPARISON:  None Available. FINDINGS: There is no evidence of pelvic fracture or diastasis. No hip dislocation bilaterally. No pelvic bone lesions are seen. IMPRESSION: Negative. Electronically Signed   By: Iven Finn M.D.   On: 03/18/2022 23:01   DG Wrist Complete Left  Result Date: 03/18/2022 CLINICAL DATA:  Fall/injury EXAM: LEFT WRIST - COMPLETE 3+ VIEW COMPARISON:  None Available. FINDINGS: There is no evidence of fracture or dislocation. There is no evidence of arthropathy or other focal bone abnormality. Soft tissues are unremarkable. IMPRESSION: Negative. Electronically Signed   By: Ronney Asters M.D.   On: 03/18/2022 23:00    Procedures .Critical Care  Performed by: Elgie Congo, MD Authorized by: Elgie Congo, MD   Critical care provider statement:    Critical care time (minutes):  45   Critical care was necessary to treat or prevent imminent or life-threatening deterioration of the following conditions:  Cardiac failure   Critical care was time spent personally by me on the following activities:  Development of treatment plan with patient or surrogate, discussions with consultants, evaluation of  patient's response to treatment, examination of patient, ordering and review of laboratory studies, ordering and review of radiographic studies, ordering and performing treatments and interventions, pulse oximetry, re-evaluation of patient's condition, review of old charts and obtaining history from patient or surrogate   Care discussed with: admitting provider   .Marland KitchenLaceration Repair  Date/Time: 03/19/2022 12:01 AM  Performed by: Elgie Congo, MD Authorized by: Elgie Congo, MD   Consent:    Consent obtained:  Verbal   Consent given by:  Patient   Risks discussed:  Infection, pain, poor cosmetic result, poor wound healing and need for additional repair Anesthesia:    Anesthesia method:  Topical application and local infiltration   Topical anesthetic:  LET   Local anesthetic:  Lidocaine 2% WITH epi Laceration details:    Location: chin.   Length (cm):  3.5 Pre-procedure details:    Preparation:  Patient was prepped and draped in usual sterile fashion Exploration:    Hemostasis achieved with:  LET   Imaging obtained comment:  CT Treatment:    Area cleansed with:  Saline   Amount of cleaning:  Extensive   Irrigation solution:  Sterile saline   Irrigation method:  Pressure wash   Visualized foreign bodies/material removed: no     Debridement:  None Skin repair:    Repair method:  Sutures   Suture size:  5-0   Suture material:  Prolene   Suture technique:  Simple interrupted   Number  of sutures:  4 Approximation:    Approximation:  Close Repair type:    Repair type:  Simple Post-procedure details:    Dressing:  Antibiotic ointment   Procedure completion:  Tolerated well, no immediate complications    Medications Ordered in ED Medications  labetalol (NORMODYNE) infusion 5 mg/mL (0.5 mg/min Intravenous New Bag/Given 03/19/22 0002)  furosemide (LASIX) injection 20 mg (has no administration in time range)  lidocaine-EPINEPHrine-tetracaine (LET) topical gel (3 mLs  Topical Given 03/18/22 2149)  lidocaine-EPINEPHrine (XYLOCAINE W/EPI) 2 %-1:100000 (with pres) injection 10 mL (10 mLs Other Given 03/18/22 2144)  Tdap (BOOSTRIX) injection 0.5 mL (0.5 mLs Intramuscular Given 03/18/22 2145)  ibuprofen (ADVIL) tablet 600 mg (600 mg Oral Given 03/18/22 2146)  lactated ringers bolus 1,000 mL (1,000 mLs Intravenous New Bag/Given 03/18/22 2239)  hydrochlorothiazide (HYDRODIURIL) tablet 25 mg (25 mg Oral Given 03/18/22 2244)  labetalol (NORMODYNE) injection 10 mg (10 mg Intravenous Given 03/18/22 2244)  iohexol (OMNIPAQUE) 350 MG/ML injection 100 mL (75 mLs Intravenous Contrast Given 03/18/22 2259)    ED Course/ Medical Decision Making/ A&P }                          Medical Decision Making Anita [name] is a 32 y.o. female.  With PMH of HTN, arthritis, gout who presents after syncopal episode.  Patient was laying down at home when she felt very unwell with having shortness of breath started coughing ran outside to get some air to cool down and the next thing she remembered she was on the ground.   Patient had scans of CT head, C-spine and face due to traumatic syncope and resulting chin laceration which was repaired by me. Tdap updated.  Scans negative for acute traumatic injury.  Head personally reviewed by me no ICH.  Concern for cardiac etiology contributing to patient's episode today.  Also considered PE.  CTA PE study negative however evidence of cardiomegaly and pulmonary hypertension and findings c/f pulmonary edema possible new CHF.  She also had uncontrolled hypertension with SBP greater than 200 and DBP greater than 120 persistently.  Her workup was concerning for hypertensive emergency troponin 68 nonspecific T wave inversions diffusely and BNP 283.  She was initially given fluids for acute on chronic anemia hemoglobin 7.6 (no blood ordered as no active bleeding, recent heavy menstrual period) however discontinued after approximately 250 cc due to findings given IV  Lasix for diuresis, pushes of IV labetalol and ordered for labetalol infusion for concern for hypertensive emergency.  D/w Dr Bridgett Larsson for admission for syncope/HTN emergency.  Amount and/or Complexity of Data Reviewed Labs: ordered. Radiology: ordered.  Risk OTC drugs. Prescription drug management. Decision regarding hospitalization.   Final Clinical Impression(s) / ED Diagnoses Final diagnoses:  Syncope and collapse  Hypertensive emergency  Anemia, unspecified type    Rx / DC Orders ED Discharge Orders     None         Elgie Congo, MD 03/19/22 0010    Elgie Congo, MD 03/19/22 [phone]

## 2022-03-19 ENCOUNTER — Inpatient Hospital Stay (HOSPITAL_COMMUNITY): Payer: Medicaid Other

## 2022-03-19 ENCOUNTER — Encounter (HOSPITAL_COMMUNITY): Payer: Self-pay | Admitting: Internal Medicine

## 2022-03-19 DIAGNOSIS — E669 Obesity, unspecified: Secondary | ICD-10-CM | POA: Diagnosis not present

## 2022-03-19 DIAGNOSIS — I129 Hypertensive chronic kidney disease with stage 1 through stage 4 chronic kidney disease, or unspecified chronic kidney disease: Secondary | ICD-10-CM | POA: Diagnosis not present

## 2022-03-19 DIAGNOSIS — Z87891 Personal history of nicotine dependence: Secondary | ICD-10-CM | POA: Diagnosis not present

## 2022-03-19 DIAGNOSIS — I361 Nonrheumatic tricuspid (valve) insufficiency: Secondary | ICD-10-CM | POA: Diagnosis not present

## 2022-03-19 DIAGNOSIS — Z7952 Long term (current) use of systemic steroids: Secondary | ICD-10-CM | POA: Diagnosis not present

## 2022-03-19 DIAGNOSIS — D509 Iron deficiency anemia, unspecified: Secondary | ICD-10-CM

## 2022-03-19 DIAGNOSIS — I517 Cardiomegaly: Secondary | ICD-10-CM

## 2022-03-19 DIAGNOSIS — I3139 Other pericardial effusion (noninflammatory): Secondary | ICD-10-CM

## 2022-03-19 DIAGNOSIS — Z91148 Patient's other noncompliance with medication regimen for other reason: Secondary | ICD-10-CM | POA: Diagnosis not present

## 2022-03-19 DIAGNOSIS — D649 Anemia, unspecified: Secondary | ICD-10-CM | POA: Diagnosis not present

## 2022-03-19 DIAGNOSIS — R55 Syncope and collapse: Secondary | ICD-10-CM | POA: Diagnosis not present

## 2022-03-19 DIAGNOSIS — Z6836 Body mass index (BMI) 36.0-36.9, adult: Secondary | ICD-10-CM | POA: Diagnosis not present

## 2022-03-19 DIAGNOSIS — I10 Essential (primary) hypertension: Secondary | ICD-10-CM

## 2022-03-19 DIAGNOSIS — N920 Excessive and frequent menstruation with regular cycle: Secondary | ICD-10-CM | POA: Diagnosis not present

## 2022-03-19 DIAGNOSIS — S3993XA Unspecified injury of pelvis, initial encounter: Secondary | ICD-10-CM | POA: Diagnosis not present

## 2022-03-19 DIAGNOSIS — Z8249 Family history of ischemic heart disease and other diseases of the circulatory system: Secondary | ICD-10-CM | POA: Diagnosis not present

## 2022-03-19 DIAGNOSIS — M109 Gout, unspecified: Secondary | ICD-10-CM | POA: Diagnosis not present

## 2022-03-19 DIAGNOSIS — S0181XA Laceration without foreign body of other part of head, initial encounter: Secondary | ICD-10-CM | POA: Diagnosis not present

## 2022-03-19 DIAGNOSIS — W19XXXA Unspecified fall, initial encounter: Secondary | ICD-10-CM | POA: Diagnosis not present

## 2022-03-19 DIAGNOSIS — R0602 Shortness of breath: Secondary | ICD-10-CM | POA: Diagnosis not present

## 2022-03-19 DIAGNOSIS — R7989 Other specified abnormal findings of blood chemistry: Secondary | ICD-10-CM

## 2022-03-19 DIAGNOSIS — I161 Hypertensive emergency: Secondary | ICD-10-CM | POA: Diagnosis present

## 2022-03-19 DIAGNOSIS — Z23 Encounter for immunization: Secondary | ICD-10-CM | POA: Diagnosis not present

## 2022-03-19 DIAGNOSIS — S6992XA Unspecified injury of left wrist, hand and finger(s), initial encounter: Secondary | ICD-10-CM | POA: Diagnosis not present

## 2022-03-19 DIAGNOSIS — E876 Hypokalemia: Secondary | ICD-10-CM | POA: Diagnosis not present

## 2022-03-19 DIAGNOSIS — Z79899 Other long term (current) drug therapy: Secondary | ICD-10-CM | POA: Diagnosis not present

## 2022-03-19 LAB — URINALYSIS, ROUTINE W REFLEX MICROSCOPIC
Bilirubin Urine: NEGATIVE
Glucose, UA: NEGATIVE mg/dL
Hgb urine dipstick: NEGATIVE
Ketones, ur: NEGATIVE mg/dL
Nitrite: NEGATIVE
Protein, ur: 100 mg/dL — AB
Specific Gravity, Urine: 1.014 (ref 1.005–1.030)
pH: 7 (ref 5.0–8.0)

## 2022-03-19 LAB — IRON AND TIBC
Iron: 20 ug/dL — ABNORMAL LOW (ref 28–170)
Saturation Ratios: 4 % — ABNORMAL LOW (ref 10.4–31.8)
TIBC: 505 ug/dL — ABNORMAL HIGH (ref 250–450)
UIBC: 485 ug/dL

## 2022-03-19 LAB — CBC WITH DIFFERENTIAL/PLATELET
Abs Immature Granulocytes: 0.04 10*3/uL (ref 0.00–0.07)
Basophils Absolute: 0 10*3/uL (ref 0.0–0.1)
Basophils Relative: 0 %
Eosinophils Absolute: 0 10*3/uL (ref 0.0–0.5)
Eosinophils Relative: 1 %
HCT: 26.2 % — ABNORMAL LOW (ref 36.0–46.0)
Hemoglobin: 7 g/dL — ABNORMAL LOW (ref 12.0–15.0)
Immature Granulocytes: 1 %
Lymphocytes Relative: 20 %
Lymphs Abs: 1.7 10*3/uL (ref 0.7–4.0)
MCH: 18.1 pg — ABNORMAL LOW (ref 26.0–34.0)
MCHC: 26.7 g/dL — ABNORMAL LOW (ref 30.0–36.0)
MCV: 67.9 fL — ABNORMAL LOW (ref 80.0–100.0)
Monocytes Absolute: 0.4 10*3/uL (ref 0.1–1.0)
Monocytes Relative: 5 %
Neutro Abs: 6.2 10*3/uL (ref 1.7–7.7)
Neutrophils Relative %: 73 %
Platelets: 174 10*3/uL (ref 150–400)
RBC: 3.86 MIL/uL — ABNORMAL LOW (ref 3.87–5.11)
RDW: 20.5 % — ABNORMAL HIGH (ref 11.5–15.5)
WBC: 8.4 10*3/uL (ref 4.0–10.5)
nRBC: 0.4 % — ABNORMAL HIGH (ref 0.0–0.2)

## 2022-03-19 LAB — ECHOCARDIOGRAM COMPLETE
Area-P 1/2: 5.27 cm2
Height: 66 in
S' Lateral: 3.05 cm
Weight: 3657.87 oz

## 2022-03-19 LAB — LIPID PANEL
Cholesterol: 151 mg/dL (ref 0–200)
HDL: 49 mg/dL (ref >40)
LDL Cholesterol: 88 mg/dL (ref 0–99)
Total CHOL/HDL Ratio: 3.1 RATIO
Triglycerides: 70 mg/dL (ref <150)
VLDL: 14 mg/dL (ref 0–40)

## 2022-03-19 LAB — TROPONIN I (HIGH SENSITIVITY)
Troponin I (High Sensitivity): 106 ng/L (ref <18)
Troponin I (High Sensitivity): 83 ng/L — ABNORMAL HIGH (ref <18)
Troponin I (High Sensitivity): 67 ng/L — ABNORMAL HIGH (ref <18)

## 2022-03-19 LAB — COMPREHENSIVE METABOLIC PANEL
ALT: 20 U/L (ref 0–44)
AST: 23 U/L (ref 15–41)
Albumin: 3.8 g/dL (ref 3.5–5.0)
Alkaline Phosphatase: 60 U/L (ref 38–126)
Anion gap: 11 (ref 5–15)
BUN: 22 mg/dL — ABNORMAL HIGH (ref 6–20)
CO2: 26 mmol/L (ref 22–32)
Calcium: 8.8 mg/dL — ABNORMAL LOW (ref 8.9–10.3)
Chloride: 101 mmol/L (ref 98–111)
Creatinine, Ser: 1.17 mg/dL — ABNORMAL HIGH (ref 0.44–1.00)
GFR, Estimated: >60 mL/min (ref >60)
Glucose, Bld: 124 mg/dL — ABNORMAL HIGH (ref 70–99)
Potassium: 3.2 mmol/L — ABNORMAL LOW (ref 3.5–5.1)
Sodium: 138 mmol/L (ref 135–145)
Total Bilirubin: 0.6 mg/dL (ref 0.3–1.2)
Total Protein: 7 g/dL (ref 6.5–8.1)

## 2022-03-19 LAB — RAPID URINE DRUG SCREEN, HOSP PERFORMED
Amphetamines: NOT DETECTED
Barbiturates: NOT DETECTED
Benzodiazepines: NOT DETECTED
Cocaine: NOT DETECTED
Opiates: NOT DETECTED
Tetrahydrocannabinol: NOT DETECTED

## 2022-03-19 LAB — HEMOGLOBIN AND HEMATOCRIT, BLOOD
HCT: 28.9 % — ABNORMAL LOW (ref 36.0–46.0)
Hemoglobin: 7.9 g/dL — ABNORMAL LOW (ref 12.0–15.0)

## 2022-03-19 LAB — PROTEIN / CREATININE RATIO, URINE
Creatinine, Urine: 103 mg/dL
Protein Creatinine Ratio: 0.96 mg/mg{Cre} — ABNORMAL HIGH (ref 0.00–0.15)
Total Protein, Urine: 99 mg/dL

## 2022-03-19 LAB — CORTISOL-AM, BLOOD: Cortisol - AM: 9.9 ug/dL (ref 6.7–22.6)

## 2022-03-19 LAB — HIV ANTIBODY (ROUTINE TESTING W REFLEX): HIV Screen 4th Generation wRfx: NONREACTIVE

## 2022-03-19 LAB — TSH: TSH: 1.113 u[IU]/mL (ref 0.350–4.500)

## 2022-03-19 LAB — MAGNESIUM: Magnesium: 2 mg/dL (ref 1.7–2.4)

## 2022-03-19 LAB — PREPARE RBC (CROSSMATCH)

## 2022-03-19 LAB — MRSA NEXT GEN BY PCR, NASAL: MRSA by PCR Next Gen: NOT DETECTED

## 2022-03-19 MED ORDER — ASPIRIN 325 MG PO TABS
325.0000 mg | ORAL_TABLET | Freq: Once | ORAL | Status: AC
  Administered 2022-03-19: 325 mg via ORAL
  Filled 2022-03-19: qty 1

## 2022-03-19 MED ORDER — POTASSIUM CHLORIDE CRYS ER 20 MEQ PO TBCR
30.0000 meq | EXTENDED_RELEASE_TABLET | Freq: Once | ORAL | Status: AC
  Administered 2022-03-19: 30 meq via ORAL
  Filled 2022-03-19: qty 1

## 2022-03-19 MED ORDER — ACETAMINOPHEN 325 MG PO TABS
650.0000 mg | ORAL_TABLET | Freq: Four times a day (QID) | ORAL | Status: DC | PRN
  Administered 2022-03-19 – 2022-03-20 (×3): 650 mg via ORAL
  Filled 2022-03-19 (×3): qty 2

## 2022-03-19 MED ORDER — ONDANSETRON HCL 4 MG/2ML IJ SOLN: 4.0000 mg | Freq: Four times a day (QID) | INTRAMUSCULAR | Status: DC | PRN

## 2022-03-19 MED ORDER — FUROSEMIDE 10 MG/ML IJ SOLN
20.0000 mg | Freq: Once | INTRAMUSCULAR | Status: AC
  Administered 2022-03-19: 20 mg via INTRAVENOUS
  Filled 2022-03-19: qty 2

## 2022-03-19 MED ORDER — HEPARIN SODIUM (PORCINE) 5000 UNIT/ML IJ SOLN
5000.0000 [IU] | Freq: Three times a day (TID) | INTRAMUSCULAR | Status: DC
  Administered 2022-03-19 – 2022-03-21 (×7): 5000 [IU] via SUBCUTANEOUS
  Filled 2022-03-19 (×8): qty 1

## 2022-03-19 MED ORDER — LISINOPRIL 5 MG PO TABS
2.5000 mg | ORAL_TABLET | Freq: Every day | ORAL | Status: DC
  Administered 2022-03-19 – 2022-03-21 (×3): 2.5 mg via ORAL
  Filled 2022-03-19 (×3): qty 1

## 2022-03-19 MED ORDER — ORAL CARE MOUTH RINSE: 15.0000 mL | OROMUCOSAL | Status: DC | PRN

## 2022-03-19 MED ORDER — SODIUM CHLORIDE 0.9% IV SOLUTION: Freq: Once | INTRAVENOUS | Status: AC

## 2022-03-19 MED ORDER — LABETALOL HCL 5 MG/ML IV SOLN
0.5000 mg/min | Status: DC
  Administered 2022-03-20: 0.5 mg/min via INTRAVENOUS
  Filled 2022-03-19: qty 40
  Filled 2022-03-19: qty 80

## 2022-03-19 MED ORDER — FERROUS SULFATE 325 (65 FE) MG PO TABS
325.0000 mg | ORAL_TABLET | Freq: Two times a day (BID) | ORAL | Status: DC
  Administered 2022-03-19 – 2022-03-21 (×5): 325 mg via ORAL
  Filled 2022-03-19 (×5): qty 1

## 2022-03-19 MED ORDER — INFLUENZA VAC SPLIT QUAD 0.5 ML IM SUSY: 0.5000 mL | PREFILLED_SYRINGE | INTRAMUSCULAR | Status: DC | PRN

## 2022-03-19 MED ORDER — CHLORHEXIDINE GLUCONATE CLOTH 2 % EX PADS
6.0000 | MEDICATED_PAD | Freq: Every day | CUTANEOUS | Status: DC
  Administered 2022-03-19 – 2022-03-21 (×3): 6 via TOPICAL

## 2022-03-19 MED ORDER — LABETALOL HCL 5 MG/ML IV SOLN
0.5000 mg/min | Status: DC
  Administered 2022-03-19: 0.5 mg/min via INTRAVENOUS
  Filled 2022-03-19: qty 80

## 2022-03-19 MED ORDER — ACETAMINOPHEN 325 MG PO TABS
650.0000 mg | ORAL_TABLET | Freq: Once | ORAL | Status: AC
  Administered 2022-03-19: 650 mg via ORAL
  Filled 2022-03-19: qty 2

## 2022-03-19 MED ORDER — DIPHENHYDRAMINE HCL 25 MG PO CAPS
25.0000 mg | ORAL_CAPSULE | Freq: Once | ORAL | Status: AC
  Administered 2022-03-19: 25 mg via ORAL
  Filled 2022-03-19: qty 1

## 2022-03-19 MED ORDER — ISOSORBIDE DINITRATE 5 MG PO TABS
5.0000 mg | ORAL_TABLET | Freq: Two times a day (BID) | ORAL | Status: DC
  Administered 2022-03-19 (×2): 5 mg via ORAL
  Filled 2022-03-19 (×3): qty 1

## 2022-03-19 NOTE — Assessment & Plan Note (Addendum)
Admit to SDU. On labetalol gtts. Check echo. Pt's sister(tara) is in her early 12s and take 4 different BP meds. Family hx of HTN. Check renin:aldosterone ratio, check cortisol. With her cardiomegaly on CT, will need to evaluate for pulmonary HTN.  Check UA and UDS.  Check renal Dopplers to rule out renal artery stenosis.

## 2022-03-19 NOTE — H&P (Signed)
History and Physical    Anita [name] [id number] DOB: 07-24-1990 DOA: 03/18/2022  DOS: the patient was seen and examined on 03/18/2022  PCP: Patient, No Pcp Per   Patient coming from: Home  I have personally briefly reviewed patient's old medical records in [place]  CC: syncope, SOB HPI: 32 year old African-American female without significant medical history presents to the ER today with syncopal episode.  Patient states that she has been told she has hypertension was not taking any medications for this.  Sister states that the patient has been told she has had hypertension for well over a year now.  Patient Sister [name] states that she has hypertension and is taking 4 different medications for blood pressure.  Family history of hypertension.  Today about 7 or 8:00 this evening, the patient was lying in the bed.  She has felt short of breath.  She sat up on the edge of the bed.  She could not catch her breath.  She went outside to get some fresh air.  She had tunnel vision and then passed out on her balcony.  She struck her chin on the floor.  She was brought to the ER by ambulance.  She denied any chest pain.  On arrival temp 98.2 heart rate 87 blood pressure 193/127 satting 99% on room air.   White count 9.4, he went on 7.6, platelets of 190  Sodium 134, potassium 3.7, BUN 23, creatinine 1.2  BNP elevated to 83  UA positive for protein 100 mg/dL, small leukocyte esterase.  EKG my review shows normal sinus rhythm.  Patient given multiple rounds of IV labetalol.  Blood pressure still is not improved.  She was started on labetalol infusion.  [facility] hospitalist contacted for admission.    ED Course: initial BP 193/127  Review of Systems:  Review of Systems  Constitutional: Negative.   HENT: Negative.    Eyes: Negative.   Respiratory:  Positive for shortness of breath.   Cardiovascular:  Negative for chest pain and palpitations.       Occ left foot swelling   Gastrointestinal: Negative.   Genitourinary: Negative.   Musculoskeletal: Negative.   Skin: Negative.   Neurological: Negative.        Syncope  Endo/Heme/Allergies: Negative.   Psychiatric/Behavioral: Negative.    All other systems reviewed and are negative.   Past Medical History:  Diagnosis Date   Arthritis    Gout    Hypertension     Past Surgical History:  Procedure Laterality Date   KNEE SURGERY     LEG SURGERY       reports that she has quit smoking. Her smoking use included cigarettes and cigars. She has never used smokeless tobacco. She reports that she does not drink alcohol and does not use drugs.  No Known Allergies  Family History  Problem Relation Age of Onset   Hypertension Mother    Aneurysm Father    Hypertension Sister    Alcohol abuse Neg Hx    Arthritis Neg Hx    Asthma Neg Hx    Birth defects Neg Hx    Cancer Neg Hx    COPD Neg Hx    Depression Neg Hx    Diabetes Neg Hx    Drug abuse Neg Hx    Early death Neg Hx    Hearing loss Neg Hx    Heart disease Neg Hx    Hyperlipidemia Neg Hx    Kidney disease Neg Hx  Learning disabilities Neg Hx    Mental illness Neg Hx    Mental retardation Neg Hx    Miscarriages / Stillbirths Neg Hx    Stroke Neg Hx    Vision loss Neg Hx     Prior to Admission medications   Not on File    Physical Exam: Vitals:   03/18/22 2100 03/18/22 2200 03/18/22 2241 03/18/22 2345  BP: (!) 174/111 (!) 188/128 (!) 208/135 (!) 207/145  Pulse: 97 88 85 83  Resp: (!) 23 20 (!) 22 (!) 22  Temp:      TempSrc:      SpO2: 99% 100% 100% 94%    Physical Exam Vitals and nursing note reviewed.  Constitutional:      General: She is not in acute distress.    Appearance: Normal appearance. She is obese. She is not ill-appearing, toxic-appearing or diaphoretic.  HENT:     Head: Normocephalic and atraumatic.     Nose: Nose normal.  Eyes:     General: No scleral icterus. Cardiovascular:     Rate and Rhythm: Normal  rate and regular rhythm.     Pulses: Normal pulses.     Heart sounds: Normal heart sounds.  Pulmonary:     Effort: Pulmonary effort is normal. No respiratory distress.     Breath sounds: Normal breath sounds. No wheezing.  Abdominal:     General: Bowel sounds are normal. There is no distension.     Palpations: Abdomen is soft.     Tenderness: There is no abdominal tenderness. There is no guarding.  Musculoskeletal:     Right lower leg: No edema.     Left lower leg: No edema.  Skin:    General: Skin is warm and dry.     Capillary Refill: Capillary refill takes less than 2 seconds.  Neurological:     General: No focal deficit present.     Mental Status: She is alert and oriented to person, place, and time.      Labs on Admission: I have personally reviewed following labs and imaging studies  CBC: Recent Labs  Lab 03/18/22 2155  WBC 9.4  NEUTROABS 7.5  HGB 7.6*  HCT 28.9*  MCV 68.5*  PLT 99991111   Basic Metabolic Panel: Recent Labs  Lab 03/18/22 2155  NA 134*  K 3.7  CL 102  CO2 24  GLUCOSE 93  BUN 23*  CREATININE 1.22*  CALCIUM 8.5*   GFR: CrCl cannot be calculated (Unknown ideal weight.). Liver Function Tests: Recent Labs  Lab 03/18/22 2155  AST 24  ALT 21  ALKPHOS 65  BILITOT 0.8  PROT 7.6  ALBUMIN 4.1   Cardiac Enzymes: Recent Labs  Lab 03/18/22 2155 03/19/22 0031  TROPONINIHS 68* 106*   BNP (last 3 results) Recent Labs    03/18/22 2155  BNP 283.0*   CBG: Recent Labs  Lab 03/18/22 2056  GLUCAP 92   Urine analysis:    Component Value Date/Time   COLORURINE YELLOW 03/19/2022 0108   APPEARANCEUR CLEAR 03/19/2022 0108   LABSPEC 1.014 03/19/2022 0108   PHURINE 7.0 03/19/2022 0108   GLUCOSEU NEGATIVE 03/19/2022 0108   HGBUR NEGATIVE 03/19/2022 0108   BILIRUBINUR NEGATIVE 03/19/2022 0108   KETONESUR NEGATIVE 03/19/2022 0108   PROTEINUR 100 (A) 03/19/2022 0108   UROBILINOGEN 2.0 (H) 07/02/2015 1534   NITRITE NEGATIVE 03/19/2022 0108    LEUKOCYTESUR SMALL (A) 03/19/2022 0108    Radiological Exams on Admission: I have personally reviewed images CT Head  Wo Contrast  Result Date: 03/18/2022 CLINICAL DATA:  Syncopal episode with fall from standing and abrasion to chin EXAM: CT HEAD WITHOUT CONTRAST CT MAXILLOFACIAL WITHOUT CONTRAST CT CERVICAL SPINE WITHOUT CONTRAST TECHNIQUE: Multidetector CT imaging of the head, cervical spine, and maxillofacial structures were performed using the standard protocol without intravenous contrast. Multiplanar CT image reconstructions of the cervical spine and maxillofacial structures were also generated. RADIATION DOSE REDUCTION: This exam was performed according to the departmental dose-optimization program which includes automated exposure control, adjustment of the mA and/or kV according to patient size and/or use of iterative reconstruction technique. COMPARISON:  CT orbits 07/09/2014 and CT head 02/09/2003; cervical spine radiographs 09/26/2013 FINDINGS: CT HEAD FINDINGS Brain: No evidence of acute infarction, hemorrhage, hydrocephalus, extra-axial collection or mass lesion/mass effect. Vascular: No hyperdense vessel or unexpected calcification. Skull: Normal. Negative for fracture or focal lesion. Other: None. CT MAXILLOFACIAL FINDINGS Osseous: No fracture or mandibular dislocation. No destructive process. Dental caries in a left mandibular molar. Orbits: Negative. No traumatic or inflammatory finding. Sinuses: Clear. Soft tissues: Abrasion/contusion to the right chin. CT CERVICAL SPINE FINDINGS Alignment: No evidence of traumatic malalignment. Skull base and vertebrae: No acute fracture. No primary bone lesion or focal pathologic process. Soft tissues and spinal canal: No prevertebral fluid or swelling. No visible canal hematoma. Disc levels: Disc space height is maintained. No spinal canal or neural foraminal narrowing. Upper chest: Negative. Other: None. IMPRESSION: 1. No acute intracranial abnormality.  2. No acute facial bone fracture. Abrasion/contusion to the right chin. 3. No acute cervical spine fracture. 4. Left mandibular molar dental carie. Electronically Signed   By: Placido Sou M.D.   On: 03/18/2022 23:38   CT Cervical Spine Wo Contrast  Result Date: 03/18/2022 CLINICAL DATA:  Syncopal episode with fall from standing and abrasion to chin EXAM: CT HEAD WITHOUT CONTRAST CT MAXILLOFACIAL WITHOUT CONTRAST CT CERVICAL SPINE WITHOUT CONTRAST TECHNIQUE: Multidetector CT imaging of the head, cervical spine, and maxillofacial structures were performed using the standard protocol without intravenous contrast. Multiplanar CT image reconstructions of the cervical spine and maxillofacial structures were also generated. RADIATION DOSE REDUCTION: This exam was performed according to the departmental dose-optimization program which includes automated exposure control, adjustment of the mA and/or kV according to patient size and/or use of iterative reconstruction technique. COMPARISON:  CT orbits 07/09/2014 and CT head 02/09/2003; cervical spine radiographs 09/26/2013 FINDINGS: CT HEAD FINDINGS Brain: No evidence of acute infarction, hemorrhage, hydrocephalus, extra-axial collection or mass lesion/mass effect. Vascular: No hyperdense vessel or unexpected calcification. Skull: Normal. Negative for fracture or focal lesion. Other: None. CT MAXILLOFACIAL FINDINGS Osseous: No fracture or mandibular dislocation. No destructive process. Dental caries in a left mandibular molar. Orbits: Negative. No traumatic or inflammatory finding. Sinuses: Clear. Soft tissues: Abrasion/contusion to the right chin. CT CERVICAL SPINE FINDINGS Alignment: No evidence of traumatic malalignment. Skull base and vertebrae: No acute fracture. No primary bone lesion or focal pathologic process. Soft tissues and spinal canal: No prevertebral fluid or swelling. No visible canal hematoma. Disc levels: Disc space height is maintained. No spinal  canal or neural foraminal narrowing. Upper chest: Negative. Other: None. IMPRESSION: 1. No acute intracranial abnormality. 2. No acute facial bone fracture. Abrasion/contusion to the right chin. 3. No acute cervical spine fracture. 4. Left mandibular molar dental carie. Electronically Signed   By: Placido Sou M.D.   On: 03/18/2022 23:38   CT Maxillofacial Wo Contrast  Result Date: 03/18/2022 CLINICAL DATA:  Syncopal episode with fall from standing and abrasion  to chin EXAM: CT HEAD WITHOUT CONTRAST CT MAXILLOFACIAL WITHOUT CONTRAST CT CERVICAL SPINE WITHOUT CONTRAST TECHNIQUE: Multidetector CT imaging of the head, cervical spine, and maxillofacial structures were performed using the standard protocol without intravenous contrast. Multiplanar CT image reconstructions of the cervical spine and maxillofacial structures were also generated. RADIATION DOSE REDUCTION: This exam was performed according to the departmental dose-optimization program which includes automated exposure control, adjustment of the mA and/or kV according to patient size and/or use of iterative reconstruction technique. COMPARISON:  CT orbits 07/09/2014 and CT head 02/09/2003; cervical spine radiographs 09/26/2013 FINDINGS: CT HEAD FINDINGS Brain: No evidence of acute infarction, hemorrhage, hydrocephalus, extra-axial collection or mass lesion/mass effect. Vascular: No hyperdense vessel or unexpected calcification. Skull: Normal. Negative for fracture or focal lesion. Other: None. CT MAXILLOFACIAL FINDINGS Osseous: No fracture or mandibular dislocation. No destructive process. Dental caries in a left mandibular molar. Orbits: Negative. No traumatic or inflammatory finding. Sinuses: Clear. Soft tissues: Abrasion/contusion to the right chin. CT CERVICAL SPINE FINDINGS Alignment: No evidence of traumatic malalignment. Skull base and vertebrae: No acute fracture. No primary bone lesion or focal pathologic process. Soft tissues and spinal canal:  No prevertebral fluid or swelling. No visible canal hematoma. Disc levels: Disc space height is maintained. No spinal canal or neural foraminal narrowing. Upper chest: Negative. Other: None. IMPRESSION: 1. No acute intracranial abnormality. 2. No acute facial bone fracture. Abrasion/contusion to the right chin. 3. No acute cervical spine fracture. 4. Left mandibular molar dental carie. Electronically Signed   By: Placido Sou M.D.   On: 03/18/2022 23:38   CT Angio Chest PE W and/or Wo Contrast  Result Date: 03/18/2022 CLINICAL DATA:  Syncope, shortness of breath. EXAM: CT ANGIOGRAPHY CHEST WITH CONTRAST TECHNIQUE: Multidetector CT imaging of the chest was performed using the standard protocol during bolus administration of intravenous contrast. Multiplanar CT image reconstructions and MIPs were obtained to evaluate the vascular anatomy. RADIATION DOSE REDUCTION: This exam was performed according to the departmental dose-optimization program which includes automated exposure control, adjustment of the mA and/or kV according to patient size and/or use of iterative reconstruction technique. CONTRAST:  34m OMNIPAQUE IOHEXOL 350 MG/ML SOLN COMPARISON:  None Available. FINDINGS: Cardiovascular: The heart is enlarged and there is a trace pericardial effusion. The aorta is normal in caliber. The pulmonary trunk is mildly distended which may be associated with underlying pulmonary artery hypertension. No evidence of pulmonary embolism. Evaluation of the pulmonary arteries is limited due to respiratory motion artifact. Mediastinum/Nodes: No enlarged mediastinal, hilar, or axillary lymph nodes. Thyroid gland, trachea, and esophagus demonstrate no significant findings. Lungs/Pleura: Mild hazy ground-glass attenuation is present in the lungs bilaterally. No consolidation, effusion, or pneumothorax. Upper Abdomen: No acute abnormality. Musculoskeletal: No acute osseous abnormality. Review of the MIP images confirms the  above findings. IMPRESSION: 1. No evidence of pulmonary embolism. 2. Hazy ground-glass attenuation in the lungs, possible edema or pneumonitis. 3. Cardiomegaly. 4. Mildly distended pulmonary trunk which may be associated with underlying pulmonary artery hypertension. Electronically Signed   By: LBrett FairyM.D.   On: 03/18/2022 23:38   DG Pelvis 1-2 Views  Result Date: 03/18/2022 CLINICAL DATA:  fall injury EXAM: PELVIS - 1-2 VIEW COMPARISON:  None Available. FINDINGS: There is no evidence of pelvic fracture or diastasis. No hip dislocation bilaterally. No pelvic bone lesions are seen. IMPRESSION: Negative. Electronically Signed   By: MIven FinnM.D.   On: 03/18/2022 23:01   DG Wrist Complete Left  Result Date: 03/18/2022 CLINICAL DATA:  Fall/injury EXAM: LEFT WRIST - COMPLETE 3+ VIEW COMPARISON:  None Available. FINDINGS: There is no evidence of fracture or dislocation. There is no evidence of arthropathy or other focal bone abnormality. Soft tissues are unremarkable. IMPRESSION: Negative. Electronically Signed   By: Ronney Asters M.D.   On: 03/18/2022 23:00    EKG: My personal interpretation of EKG shows: NSR    Assessment/Plan Principal Problem:   Malignant hypertension Active Problems:   Elevated troponin   Cardiomegaly   Microcytic anemia    Assessment and Plan: * Malignant hypertension Admit to SDU. On labetalol gtts. Check echo. Pt's sister(tara) is in her early 24s and take 4 different BP meds. Family hx of HTN. Check renin:aldosterone ratio, check cortisol. With her cardiomegaly on CT, will need to evaluate for pulmonary HTN.  Check UA and UDS.  Check renal Dopplers to rule out renal artery stenosis.  Microcytic anemia Check TIBC and iron levels.  Patient had a menstrual cycle within the last 2 weeks.  Cardiomegaly Seen on CT chest. Check echo.  Elevated troponin Give 325 mg ASA. Check lipid panel. Pt without any CP. Likely demand ischemia due to severe htn.   DVT  prophylaxis: SQ Heparin Code Status: Full Code Family Communication: discussed with pt and pt's sister tara at bedside  Disposition Plan: return home  Consults called: none  Admission status: Inpatient, Step Down Unit   Kristopher Oppenheim, DO [facility] 03/19/2022, 1:31 AM

## 2022-03-19 NOTE — Assessment & Plan Note (Signed)
Check TIBC and iron levels.  Patient had a menstrual cycle within the last 2 weeks.

## 2022-03-19 NOTE — TOC Initial Note (Addendum)
Transition of Care [facility]) - Initial/Assessment Note    Patient Details  Name: Anita [name] MRN: [id number] Date of Birth: [birth year]  [facility] CM/SW Contact:    Anita Kaufman, RN Phone Number: 03/19/2022, 2:06 PM  Clinical Narrative:   Anita [name] consult for PCP needs and HF/COPD consult. Per chart review patient presented to ED for syncopal episode and shortness of breath. Patient does not qualify for HF protocol. This RNCM spoke with patient and sister Anita [name] at bedside. Patient was not communicating( gave thumbs up when asked questions) as her BP was elevated and RN was at bedside. Patient's sister reports patient does not have a PCP and available in the morning for PCP follow up appointment. Will schedule a f/u PCP establishment appointment.  TOC will continue to follow for needs.                 - 2:38p This RNCM scheduled follow up appointment with [facility] on 03/23/22 at 9:30am. This was the first available appointment as next available is in April. Attached to AVS  TOC will continue to follow.   Barriers to Discharge: Continued Medical Work up   Patient Goals and CMS Choice   CMS Medicare.gov Compare Post Acute Care list provided to:: Patient Choice offered to / list presented to : Patient, Sibling [place] ownership interest in [facility].provided to:: Sibling    Expected Discharge Plan and Services In-house Referral: NA Discharge Planning Services: CM Consult   Living arrangements for the past 2 months: Apartment                 DME Arranged: N/A DME Agency: NA       HH Arranged: NA HH Agency: NA        Prior Living Arrangements/Services Living arrangements for the past 2 months: Apartment Lives with:: Spouse Patient language and need for interpreter reviewed:: Yes Do you feel safe going back to the place where you live?: Yes      Need for Family Participation in Patient Care: No (Comment) Care giver  support system in place?: No (comment) Current home services: Other (comment) (none) Criminal Activity/Legal Involvement Pertinent to Current Situation/Hospitalization: No - Comment as needed  Activities of Daily Living Home Assistive Devices/Equipment: None ADL Screening (condition at time of admission) Patient's cognitive ability adequate to safely complete daily activities?: Yes Is the patient deaf or have difficulty hearing?: No Does the patient have difficulty seeing, even when wearing glasses/contacts?: No Does the patient have difficulty concentrating, remembering, or making decisions?: No Patient able to express need for assistance with ADLs?: No Does the patient have difficulty dressing or bathing?: No Independently performs ADLs?: Yes (appropriate for developmental age) Does the patient have difficulty walking or climbing stairs?: No Weakness of Legs: None Weakness of Arms/Hands: None  Permission Sought/Granted Permission sought to share information with : Case Manager Permission granted to share information with : Yes, Verbal Permission Granted  Share Information with NAME: Case Manager           Emotional Assessment Appearance:: Appears stated age Attitude/Demeanor/Rapport: Gracious Affect (typically observed): Accepting Orientation: : Oriented to Self, Oriented to Place, Oriented to  Time, Oriented to Situation Alcohol / Substance Use: Not Applicable Psych Involvement: No (comment)  Admission diagnosis:  Syncope and collapse [R55] Malignant hypertension [I10] Hypertensive emergency [I16.1] Anemia, unspecified type [D64.9] Patient Active Problem List   Diagnosis Date Noted   Malignant hypertension 03/19/2022  Elevated troponin 03/19/2022   Cardiomegaly 03/19/2022   Microcytic anemia 03/19/2022   PCP:  Patient, No Pcp Per Pharmacy:   [facility] [place] [address] Phone: [phone] Fax:  [phone]  [facility] [place] [address] Phone: [phone] Fax: [phone]  [id number]: [phone]: [phone]    Social Determinants of Health (SDOH) Social History: [place] No Food Insecurity (03/19/2022)  Housing: Low Risk  (03/19/2022)  Transportation Needs: No Transportation Needs (03/19/2022)  Utilities: At Risk (03/19/2022)  Tobacco Use: Medium Risk (03/19/2022)   SDOH Interventions: Housing Interventions: Patient Refused   Readmission Risk Interventions     No data to display

## 2022-03-19 NOTE — Progress Notes (Signed)
  Echocardiogram 2D Echocardiogram has been performed.  Anita [name] 03/19/2022, 2:40 PM

## 2022-03-19 NOTE — Assessment & Plan Note (Signed)
Give 325 mg ASA. Check lipid panel. Pt without any CP. Likely demand ischemia due to severe htn.

## 2022-03-19 NOTE — Progress Notes (Signed)
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  Nexplanon - Obtain echocardiogram for completion purposes  Syncope/fall and bruise to chin  Explained by potential hypertensive emergency and also anemia - Keep on telemetry today - Obtain echocardiogram as above - Requires possible ACE inhibitor given UA showing micro proteinuria - Would be careful with this and will need labs in the next several weeks - Progressive lowering of blood pressure to prevent normotensive AKI as she is probably been hypertensive for a while - Continue blood pressure control, but would switch labetalol to lisinopril 2.5, Isordil 5 2 times daily  Elevated troponin, elevated BNP - Likely secondary to severe hypertension, no chest pain whatsoever-no further workup  BMI 36 - Needs durable weight loss strategies in the outpatient setting  DVT  prophylaxis: SCD ambulate Code Status: Full Family Communication: Discussed with sister at the bedside who understands details Disposition:  Status is: Inpatient Remains inpatient appropriate because: Will require blood transfusion in addition to echocardiogram and careful slow lowering of blood pressure over the next day and a half    Subjective: Awake coherent no distress-seems a little bit sleepy No dark stool no tarry stool-does report significant bleeding monthly has never been told that she has fibroid uterus and has never sought medical attention for this  Objective: Vitals:   03/19/22 0545 03/19/22 0600 03/19/22 0615 03/19/22 0630  BP: (!) 153/95 (!) 145/88 (!) 148/87 (!) 160/93  Pulse: 80 80 80 80  Resp: (!) '21 20 20 16  '$ Temp:      TempSrc:      SpO2: 93% 92% 92% 93%  Weight:      Height:        Intake/Output Summary (Last 24 hours) at 03/19/2022 0706 Last data filed at 03/19/2022 0600 Gross per 24 hour  Intake 554.65 ml  Output 2100 ml  Net -1545.35 ml   Filed Weights   03/19/22 0358  Weight: 103.7 kg    Examination:  EOMI NCAT no icterus no pallor Chest is clear no wheeze rales rhonchi ROM is intact moving 4 limbs equally I do hear a murmur in the R USE 2/6 to 3/6 On telemetry she is in sinus Abdomen is soft no rebound no guarding No rash Psych is euthymic  Data Reviewed: personally reviewed   CBC    Component Value Date/Time   WBC 8.4 03/19/2022 0456   RBC 3.86 (L) 03/19/2022 0456   HGB  7.0 (L) 03/19/2022 0456   HCT 26.2 (L) 03/19/2022 0456   PLT 174 03/19/2022 0456   MCV 67.9 (L) 03/19/2022 0456   MCH 18.1 (L) 03/19/2022 0456   MCHC 26.7 (L) 03/19/2022 0456   RDW 20.5 (H) 03/19/2022 0456   LYMPHSABS 1.7 03/19/2022 0456   MONOABS 0.4 03/19/2022 0456   EOSABS 0.0 03/19/2022 0456   BASOSABS 0.0 03/19/2022 0456      Latest Ref Rng & Units 03/19/2022    4:56 AM 03/18/2022    9:55 PM 09/25/2012   11:39 AM  CMP  Glucose 70 - 99 mg/dL 124  93   96   BUN 6 - 20 mg/dL '22  23  12   '$ Creatinine 0.44 - 1.00 mg/dL 1.17  1.22  0.88   Sodium 135 - 145 mmol/L 138  134  135   Potassium 3.5 - 5.1 mmol/L 3.2  3.7  3.7   Chloride 98 - 111 mmol/L 101  102  99   CO2 22 - 32 mmol/L '26  24  24   '$ Calcium 8.9 - 10.3 mg/dL 8.8  8.5  8.8   Total Protein 6.5 - 8.1 g/dL 7.0  7.6    Total Bilirubin 0.3 - 1.2 mg/dL 0.6  0.8    Alkaline Phos 38 - 126 U/L 60  65    AST 15 - 41 U/L 23  24    ALT 0 - 44 U/L 20  21       Radiology Studies: CT Head Wo Contrast  Result Date: 03/18/2022 CLINICAL DATA:  Syncopal episode with fall from standing and abrasion to chin EXAM: CT HEAD WITHOUT CONTRAST CT MAXILLOFACIAL WITHOUT CONTRAST CT CERVICAL SPINE WITHOUT CONTRAST TECHNIQUE: Multidetector CT imaging of the head, cervical spine, and maxillofacial structures were performed using the standard protocol without intravenous contrast. Multiplanar CT image reconstructions of the cervical spine and maxillofacial structures were also generated. RADIATION DOSE REDUCTION: This exam was performed according to the departmental dose-optimization program which includes automated exposure control, adjustment of the mA and/or kV according to patient size and/or use of iterative reconstruction technique. COMPARISON:  CT orbits 07/09/2014 and CT head 02/09/2003; cervical spine radiographs 09/26/2013 FINDINGS: CT HEAD FINDINGS Brain: No evidence of acute infarction, hemorrhage, hydrocephalus, extra-axial collection or mass lesion/mass effect. Vascular: No hyperdense vessel or unexpected calcification. Skull: Normal. Negative for fracture or focal lesion. Other: None. CT MAXILLOFACIAL FINDINGS Osseous: No fracture or mandibular dislocation. No destructive process. Dental caries in a left mandibular molar. Orbits: Negative. No traumatic or inflammatory finding. Sinuses: Clear. Soft tissues: Abrasion/contusion to the right chin. CT CERVICAL SPINE FINDINGS Alignment: No evidence of traumatic  malalignment. Skull base and vertebrae: No acute fracture. No primary bone lesion or focal pathologic process. Soft tissues and spinal canal: No prevertebral fluid or swelling. No visible canal hematoma. Disc levels: Disc space height is maintained. No spinal canal or neural foraminal narrowing. Upper chest: Negative. Other: None. IMPRESSION: 1. No acute intracranial abnormality. 2. No acute facial bone fracture. Abrasion/contusion to the right chin. 3. No acute cervical spine fracture. 4. Left mandibular molar dental carie. Electronically Signed   By: Placido Sou M.D.   On: 03/18/2022 23:38   CT Cervical Spine Wo Contrast  Result Date: 03/18/2022 CLINICAL DATA:  Syncopal episode with fall from standing and abrasion to chin EXAM: CT HEAD WITHOUT CONTRAST CT MAXILLOFACIAL WITHOUT CONTRAST CT CERVICAL SPINE WITHOUT CONTRAST TECHNIQUE: Multidetector CT imaging of the head, cervical spine, and maxillofacial structures were performed  using the standard protocol without intravenous contrast. Multiplanar CT image reconstructions of the cervical spine and maxillofacial structures were also generated. RADIATION DOSE REDUCTION: This exam was performed according to the departmental dose-optimization program which includes automated exposure control, adjustment of the mA and/or kV according to patient size and/or use of iterative reconstruction technique. COMPARISON:  CT orbits 07/09/2014 and CT head 02/09/2003; cervical spine radiographs 09/26/2013 FINDINGS: CT HEAD FINDINGS Brain: No evidence of acute infarction, hemorrhage, hydrocephalus, extra-axial collection or mass lesion/mass effect. Vascular: No hyperdense vessel or unexpected calcification. Skull: Normal. Negative for fracture or focal lesion. Other: None. CT MAXILLOFACIAL FINDINGS Osseous: No fracture or mandibular dislocation. No destructive process. Dental caries in a left mandibular molar. Orbits: Negative. No traumatic or inflammatory finding. Sinuses:  Clear. Soft tissues: Abrasion/contusion to the right chin. CT CERVICAL SPINE FINDINGS Alignment: No evidence of traumatic malalignment. Skull base and vertebrae: No acute fracture. No primary bone lesion or focal pathologic process. Soft tissues and spinal canal: No prevertebral fluid or swelling. No visible canal hematoma. Disc levels: Disc space height is maintained. No spinal canal or neural foraminal narrowing. Upper chest: Negative. Other: None. IMPRESSION: 1. No acute intracranial abnormality. 2. No acute facial bone fracture. Abrasion/contusion to the right chin. 3. No acute cervical spine fracture. 4. Left mandibular molar dental carie. Electronically Signed   By: Placido Sou M.D.   On: 03/18/2022 23:38   CT Maxillofacial Wo Contrast  Result Date: 03/18/2022 CLINICAL DATA:  Syncopal episode with fall from standing and abrasion to chin EXAM: CT HEAD WITHOUT CONTRAST CT MAXILLOFACIAL WITHOUT CONTRAST CT CERVICAL SPINE WITHOUT CONTRAST TECHNIQUE: Multidetector CT imaging of the head, cervical spine, and maxillofacial structures were performed using the standard protocol without intravenous contrast. Multiplanar CT image reconstructions of the cervical spine and maxillofacial structures were also generated. RADIATION DOSE REDUCTION: This exam was performed according to the departmental dose-optimization program which includes automated exposure control, adjustment of the mA and/or kV according to patient size and/or use of iterative reconstruction technique. COMPARISON:  CT orbits 07/09/2014 and CT head 02/09/2003; cervical spine radiographs 09/26/2013 FINDINGS: CT HEAD FINDINGS Brain: No evidence of acute infarction, hemorrhage, hydrocephalus, extra-axial collection or mass lesion/mass effect. Vascular: No hyperdense vessel or unexpected calcification. Skull: Normal. Negative for fracture or focal lesion. Other: None. CT MAXILLOFACIAL FINDINGS Osseous: No fracture or mandibular dislocation. No  destructive process. Dental caries in a left mandibular molar. Orbits: Negative. No traumatic or inflammatory finding. Sinuses: Clear. Soft tissues: Abrasion/contusion to the right chin. CT CERVICAL SPINE FINDINGS Alignment: No evidence of traumatic malalignment. Skull base and vertebrae: No acute fracture. No primary bone lesion or focal pathologic process. Soft tissues and spinal canal: No prevertebral fluid or swelling. No visible canal hematoma. Disc levels: Disc space height is maintained. No spinal canal or neural foraminal narrowing. Upper chest: Negative. Other: None. IMPRESSION: 1. No acute intracranial abnormality. 2. No acute facial bone fracture. Abrasion/contusion to the right chin. 3. No acute cervical spine fracture. 4. Left mandibular molar dental carie. Electronically Signed   By: Placido Sou M.D.   On: 03/18/2022 23:38   CT Angio Chest PE W and/or Wo Contrast  Result Date: 03/18/2022 CLINICAL DATA:  Syncope, shortness of breath. EXAM: CT ANGIOGRAPHY CHEST WITH CONTRAST TECHNIQUE: Multidetector CT imaging of the chest was performed using the standard protocol during bolus administration of intravenous contrast. Multiplanar CT image reconstructions and MIPs were obtained to evaluate the vascular anatomy. RADIATION DOSE REDUCTION: This exam was performed according to the  departmental dose-optimization program which includes automated exposure control, adjustment of the mA and/or kV according to patient size and/or use of iterative reconstruction technique. CONTRAST:  25m OMNIPAQUE IOHEXOL 350 MG/ML SOLN COMPARISON:  None Available. FINDINGS: Cardiovascular: The heart is enlarged and there is a trace pericardial effusion. The aorta is normal in caliber. The pulmonary trunk is mildly distended which may be associated with underlying pulmonary artery hypertension. No evidence of pulmonary embolism. Evaluation of the pulmonary arteries is limited due to respiratory motion artifact.  Mediastinum/Nodes: No enlarged mediastinal, hilar, or axillary lymph nodes. Thyroid gland, trachea, and esophagus demonstrate no significant findings. Lungs/Pleura: Mild hazy ground-glass attenuation is present in the lungs bilaterally. No consolidation, effusion, or pneumothorax. Upper Abdomen: No acute abnormality. Musculoskeletal: No acute osseous abnormality. Review of the MIP images confirms the above findings. IMPRESSION: 1. No evidence of pulmonary embolism. 2. Hazy ground-glass attenuation in the lungs, possible edema or pneumonitis. 3. Cardiomegaly. 4. Mildly distended pulmonary trunk which may be associated with underlying pulmonary artery hypertension. Electronically Signed   By: LBrett FairyM.D.   On: 03/18/2022 23:38   DG Pelvis 1-2 Views  Result Date: 03/18/2022 CLINICAL DATA:  fall injury EXAM: PELVIS - 1-2 VIEW COMPARISON:  None Available. FINDINGS: There is no evidence of pelvic fracture or diastasis. No hip dislocation bilaterally. No pelvic bone lesions are seen. IMPRESSION: Negative. Electronically Signed   By: MIven FinnM.D.   On: 03/18/2022 23:01   DG Wrist Complete Left  Result Date: 03/18/2022 CLINICAL DATA:  Fall/injury EXAM: LEFT WRIST - COMPLETE 3+ VIEW COMPARISON:  None Available. FINDINGS: There is no evidence of fracture or dislocation. There is no evidence of arthropathy or other focal bone abnormality. Soft tissues are unremarkable. IMPRESSION: Negative. Electronically Signed   By: ARonney AstersM.D.   On: 03/18/2022 23:00     Scheduled Meds:  sodium chloride   Intravenous Once   acetaminophen  650 mg Oral Once   Chlorhexidine Gluconate Cloth  6 each Topical Daily   diphenhydrAMINE  25 mg Oral Once   furosemide  20 mg Intravenous Once   heparin  5,000 Units Subcutaneous Q8H   isosorbide dinitrate  5 mg Oral BID   lisinopril  2.5 mg Oral Daily   potassium chloride  30 mEq Oral Once   Continuous Infusions:  labetalol (NORMODYNE) infusion 5 mg/mL Stopped  (03/19/22 0450)     LOS: 0 days   Time spent: 3[facility] To contact the attending provider between 7A-7P or the covering provider during after hours 7P-7A, please log into the web site [email or web address] and access using universal [place] password for that web site. If you do not have the password, please call the hospital operator.  03/19/2022, 7:06 AM

## 2022-03-19 NOTE — Progress Notes (Signed)
Renal artery duplex study completed.   Please see CV Proc for preliminary results.   Erin Obando, RDMS, RVT  

## 2022-03-19 NOTE — Subjective & Objective (Signed)
CC: syncope, SOB HPI: 32 year old African-American female without significant medical history presents to the ER today with syncopal episode.  Patient states that she has been told she has hypertension was not taking any medications for this.  Sister states that the patient has been told she has had hypertension for well over a year now.  Patient Sister Anita [name] states that she has hypertension and is taking 4 different medications for blood pressure.  Family history of hypertension.  Today about 7 or 8:00 this evening, the patient was lying in the bed.  She has felt short of breath.  She sat up on the edge of the bed.  She could not catch her breath.  She went outside to get some fresh air.  She had tunnel vision and then passed out on her balcony.  She struck her chin on the floor.  She was brought to the ER by ambulance.  She denied any chest pain.  On arrival temp 98.2 heart rate 87 blood pressure 193/127 satting 99% on room air.   White count 9.4, he went on 7.6, platelets of 190  Sodium 134, potassium 3.7, BUN 23, creatinine 1.2  BNP elevated to 83  UA positive for protein 100 mg/dL, small leukocyte esterase.  EKG my review shows normal sinus rhythm.  Patient given multiple rounds of IV labetalol.  Blood pressure still is not improved.  She was started on labetalol infusion.  [facility] contacted for admission.

## 2022-03-19 NOTE — Assessment & Plan Note (Signed)
Seen on CT chest. Check echo.

## 2022-03-19 NOTE — Progress Notes (Signed)
1600 Patient O2 sat was 87 while asleep. O2 ws applied via nasal cannula at 2lpm.--- Donnie Mesa, RN

## 2022-03-20 DIAGNOSIS — I10 Essential (primary) hypertension: Secondary | ICD-10-CM | POA: Diagnosis not present

## 2022-03-20 LAB — BASIC METABOLIC PANEL
Anion gap: 9 (ref 5–15)
BUN: 18 mg/dL (ref 6–20)
CO2: 25 mmol/L (ref 22–32)
Calcium: 8.4 mg/dL — ABNORMAL LOW (ref 8.9–10.3)
Chloride: 102 mmol/L (ref 98–111)
Creatinine, Ser: 1.17 mg/dL — ABNORMAL HIGH (ref 0.44–1.00)
GFR, Estimated: >60 mL/min (ref >60)
Glucose, Bld: 87 mg/dL (ref 70–99)
Potassium: 2.9 mmol/L — ABNORMAL LOW (ref 3.5–5.1)
Sodium: 136 mmol/L (ref 135–145)

## 2022-03-20 LAB — BPAM RBC
Blood Product Expiration Date: 202404172359
ISSUE DATE / TIME: 202403141008
Unit Type and Rh: 5100

## 2022-03-20 LAB — TYPE AND SCREEN
ABO/RH(D): O POS
Antibody Screen: NEGATIVE
Unit division: 0

## 2022-03-20 LAB — CBC WITH DIFFERENTIAL/PLATELET
Abs Immature Granulocytes: 0.04 10*3/uL (ref 0.00–0.07)
Basophils Absolute: 0 10*3/uL (ref 0.0–0.1)
Basophils Relative: 0 %
Eosinophils Absolute: 0.2 10*3/uL (ref 0.0–0.5)
Eosinophils Relative: 2 %
HCT: 28.3 % — ABNORMAL LOW (ref 36.0–46.0)
Hemoglobin: 7.6 g/dL — ABNORMAL LOW (ref 12.0–15.0)
Immature Granulocytes: 1 %
Lymphocytes Relative: 39 %
Lymphs Abs: 2.9 10*3/uL (ref 0.7–4.0)
MCH: 19.2 pg — ABNORMAL LOW (ref 26.0–34.0)
MCHC: 26.9 g/dL — ABNORMAL LOW (ref 30.0–36.0)
MCV: 71.5 fL — ABNORMAL LOW (ref 80.0–100.0)
Monocytes Absolute: 0.4 10*3/uL (ref 0.1–1.0)
Monocytes Relative: 5 %
Neutro Abs: 3.9 10*3/uL (ref 1.7–7.7)
Neutrophils Relative %: 53 %
Platelets: 166 10*3/uL (ref 150–400)
RBC: 3.96 MIL/uL (ref 3.87–5.11)
RDW: 21.6 % — ABNORMAL HIGH (ref 11.5–15.5)
WBC: 7.4 10*3/uL (ref 4.0–10.5)
nRBC: 0.9 % — ABNORMAL HIGH (ref 0.0–0.2)

## 2022-03-20 MED ORDER — SODIUM CHLORIDE 0.9 % IV SOLN
250.0000 mg | Freq: Once | INTRAVENOUS | Status: AC
  Administered 2022-03-20: 250 mg via INTRAVENOUS
  Filled 2022-03-20: qty 20

## 2022-03-20 MED ORDER — ISOSORBIDE MONONITRATE ER 30 MG PO TB24
30.0000 mg | ORAL_TABLET | Freq: Every day | ORAL | Status: DC
  Administered 2022-03-20 – 2022-03-21 (×2): 30 mg via ORAL
  Filled 2022-03-20 (×2): qty 1

## 2022-03-20 MED ORDER — HYDRALAZINE HCL 20 MG/ML IJ SOLN: 5.0000 mg | Freq: Three times a day (TID) | INTRAMUSCULAR | Status: DC | PRN

## 2022-03-20 MED ORDER — MEDROXYPROGESTERONE ACETATE 150 MG/ML IM SUSP
150.0000 mg | Freq: Once | INTRAMUSCULAR | Status: AC
  Administered 2022-03-20: 150 mg via INTRAMUSCULAR
  Filled 2022-03-20: qty 1

## 2022-03-20 MED ORDER — POTASSIUM CHLORIDE CRYS ER 20 MEQ PO TBCR
40.0000 meq | EXTENDED_RELEASE_TABLET | Freq: Two times a day (BID) | ORAL | Status: DC
  Administered 2022-03-20 (×2): 40 meq via ORAL
  Filled 2022-03-20 (×2): qty 2

## 2022-03-20 NOTE — Progress Notes (Signed)
Noted pt trends of SB/P 130'S. Paused ;labetalol gtt at this time that was infusing at 0.5mg /min. No change in clinical picture

## 2022-03-20 NOTE — Progress Notes (Addendum)
Noted Labetalol gtt still on hold at this time. On going assessment reveals SB/P consistently between 120=130

## 2022-03-20 NOTE — Progress Notes (Signed)
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  Nexplanon - echo as above  Menorrhagia -d/w GYN, who ok the use of Depo-provera, do not recommend estrogen contaning OCP given very high BP -needs set up with OB-GYN as OP  Syncope/fall and bruise to chin  Explained by potential hypertensive emergency and also anemia - Requires possible ACE inhibitor given UA showing micro proteinuria - Progressive lowering of blood pressure , now off Labetalol and can transfer to Floor - Continue lisinopril 2.5, Isordil 5 2 times daily--PRN's in place  Elevated troponin, elevated BNP - Likely secondary to severe hypertension, no chest pain whatsoever-no further workup  Severe hypokalemia -Kdur BId, get am mag and replace again in am if prn  BMI 36 - Needs durable weight loss strategies in the outpatient setting  DVT prophylaxis: SCD  ambulate Code Status: Full Family Communication: Discussed with patient Disposition:  Status is: Inpatient Remains inpatient appropriate because: Will require blood transfusion in addition to echocardiogram and careful slow lowering of blood pressure over the next day and a half    Subjective:  Coherent in nad no focal deficit No bleed No cp--eating well  Objective: Vitals:   03/20/22 0800 03/20/22 0900 03/20/22 1000 03/20/22 1202  BP: 123/68 (!) 141/88 (!) 130/112   Pulse: 82 91 83   Resp: (!) 22 20 19    Temp: 98.7 F (37.1 C)   98.1 F (36.7 C)  TempSrc: Oral   Oral  SpO2: 98% (!) 85% 98%   Weight:      Height:        Intake/Output Summary (Last 24 hours) at 03/20/2022 1520 Last data filed at 03/20/2022 1347 Gross per 24 hour  Intake 390.6 ml  Output 2900 ml  Net -2509.4 ml    Filed Weights   03/19/22 0358  Weight: 103.7 kg    Examination:  EOMI NCAT no icterus no pallor Chest is clear  S1 s 2no m Abd soft nt nd no rebound no guard Neuro intact  Data Reviewed: personally reviewed   CBC    Component Value Date/Time   WBC 7.4 03/20/2022 0332   RBC 3.96 03/20/2022 0332   HGB 7.6 (L) 03/20/2022 0332   HCT 28.3 (L) 03/20/2022 0332   PLT 166 03/20/2022 0332   MCV 71.5 (L) 03/20/2022 0332   MCH 19.2 (L) 03/20/2022 0332   MCHC 26.9 (L) 03/20/2022 0332   RDW 21.6 (H) 03/20/2022 0332   LYMPHSABS 2.9 03/20/2022 0332   MONOABS  0.4 03/20/2022 0332   EOSABS 0.2 03/20/2022 0332   BASOSABS 0.0 03/20/2022 0332      Latest Ref Rng & Units 03/20/2022    3:32 AM 03/19/2022    4:56 AM 03/18/2022    9:55 PM  CMP  Glucose 70 - 99 mg/dL 87  124  93   BUN 6 - 20 mg/dL 18  22  23    Creatinine 0.44 - 1.00 mg/dL 1.17  1.17  1.22   Sodium 135 - 145 mmol/L 136  138  134   Potassium 3.5 - 5.1 mmol/L 2.9  3.2  3.7   Chloride 98 - 111 mmol/L 102  101  102   CO2 22 - 32 mmol/L 25  26  24    Calcium 8.9 - 10.3 mg/dL 8.4  8.8  8.5   Total Protein 6.5 - 8.1 g/dL  7.0  7.6    Total Bilirubin 0.3 - 1.2 mg/dL  0.6  0.8   Alkaline Phos 38 - 126 U/L  60  65   AST 15 - 41 U/L  23  24   ALT 0 - 44 U/L  20  21      Radiology Studies: VAS US RENAL ARTERY DUPLEX  Result Date: 03/19/2022 ABDOMINAL VISCERAL Patient Name:  Anita [name]  Date of Exam:   03/19/2022 Medical Rec #: [id number]        Accession #:    [id number] Date of Birth: Sep 12, 1990        Patient Gender: F Patient Age:   89 years Exam Location:  [facility] Procedure:      VAS US RENAL ARTERY DUPLEX Referring Phys: [place] -------------------------------------------------------------------------------- Indications: Malignant hypertension Other Factors: Family history of hypertension. Comparison Study: No prior studies. Performing Technologist: Darlin Coco RDMS, RVT  Examination Guidelines: A complete evaluation includes B-mode imaging, spectral Doppler, color Doppler, and power Doppler as needed of all accessible portions of each vessel. Bilateral testing is considered an integral part of a complete examination. Limited examinations for reoccurring indications may be performed as noted.  Duplex Findings: +----------------------+--------+--------+------+--------+ Mesenteric            PSV cm/sEDV cm/sPlaqueComments +----------------------+--------+--------+------+--------+ Aorta Mid                78                          +----------------------+--------+--------+------+--------+ Celiac Artery Proximal  200                          +----------------------+--------+--------+------+--------+ SMA Proximal            127                          +----------------------+--------+--------+------+--------+    +------------------+--------+--------+-------+ Right Renal ArteryPSV cm/sEDV cm/sComment +------------------+--------+--------+-------+ Origin               86      27           +------------------+--------+--------+-------+ Proximal             76      25            +------------------+--------+--------+-------+ Mid                  97      32           +------------------+--------+--------+-------+  Distal               72      21           +------------------+--------+--------+-------+ +-----------------+--------+--------+-------+ Left Renal ArteryPSV cm/sEDV cm/sComment +-----------------+--------+--------+-------+ Origin              74      18           +-----------------+--------+--------+-------+ Proximal            82      22           +-----------------+--------+--------+-------+ Mid                 55      17           +-----------------+--------+--------+-------+ Distal              49      15           +-----------------+--------+--------+-------+ +------------+--------+--------+----+-----------+--------+--------+----+ Right KidneyPSV cm/sEDV cm/sRI  Left KidneyPSV cm/sEDV cm/sRI   +------------+--------+--------+----+-----------+--------+--------+----+ Upper Pole  14      6       0.57Upper Pole 33      14      0.58 +------------+--------+--------+----+-----------+--------+--------+----+ Mid         33      10      0.69Mid        35      14      0.60 +------------+--------+--------+----+-----------+--------+--------+----+ Lower Pole  17      6       0.65Lower Pole 34      14      0.57 +------------+--------+--------+----+-----------+--------+--------+----+ Hilar       61      18      0.71Hilar      23      7       0.69 +------------+--------+--------+----+-----------+--------+--------+----+ +------------------+-----+------------------+-----+ Right Kidney           Left Kidney             +------------------+-----+------------------+-----+ RAR                    RAR                     +------------------+-----+------------------+-----+ RAR (manual)      1.24 RAR (manual)      1.05  +------------------+-----+------------------+-----+ Cortex                 Cortex                   +------------------+-----+------------------+-----+ Cortex thickness       Corex thickness         +------------------+-----+------------------+-----+ Kidney length (cm)10.92Kidney length (cm)11.55 +------------------+-----+------------------+-----+  Summary: Renal:  Right: No evidence of right renal artery stenosis. Normal right        Resisitive Index. Normal size right kidney. RRV flow present. Left:  No evidence of left renal artery stenosis. Normal left        Resistive Index. Normal size of left kidney. LRV flow        present. Mesenteric: Normal Celiac artery and Superior Mesenteric artery findings.  *See table(s) above for measurements and observations.  Diagnosing physician: Orlie Pollen  Electronically signed by Orlie Pollen on 03/19/2022 at 5:32:28 PM.    Final    ECHOCARDIOGRAM COMPLETE  Result Date: 03/19/2022    ECHOCARDIOGRAM REPORT   Patient Name:  Anita [name] Date of Exam: 03/19/2022 Medical Rec #:  [id number]       Height:       66.0 in Accession #:    [id number]      Weight:       228.6 lb Date of Birth:  10-Feb-1990       BSA:          2.117 m Patient Age:    31 years        BP:           160/98 mmHg Patient Gender: F               HR:           78 bpm. Exam Location:  Inpatient Procedure: 2D Echo, Cardiac Doppler, Color Doppler, Strain Analysis and 3D Echo Indications:    I51.7 Cardiomegaly  History:        Patient has no prior history of Echocardiogram examinations.                 Cardiomyopathy, Signs/Symptoms:Chest Pain; Risk                 Factors:Hypertension and Current Smoker.  Sonographer:    Roseanna Rainbow RDCS Referring Phys: [id number] ERIC CHEN  Sonographer Comments: Image acquisition challenging due to patient body habitus. IMPRESSIONS  1. Left ventricular ejection fraction by 3D volume is 64 %. The left ventricle has normal function. The left ventricle has no regional wall motion abnormalities. There is severe concentric left ventricular hypertrophy. Left  ventricular diastolic parameters are indeterminate.  2. Right ventricular systolic function is normal. The right ventricular size is normal. Tricuspid regurgitation signal is inadequate for assessing PA pressure.  3. Left atrial size was mildly dilated.  4. A small pericardial effusion is present. The pericardial effusion is circumferential.  5. The mitral valve is normal in structure. Trivial mitral valve regurgitation. No evidence of mitral stenosis.  6. The aortic valve is normal in structure. Aortic valve regurgitation is not visualized. No aortic stenosis is present.  7. The inferior vena cava is dilated in size with >50% respiratory variability, suggesting right atrial pressure of 8 mmHg. FINDINGS  Left Ventricle: Left ventricular ejection fraction by 3D volume is 64 %. The left ventricle has normal function. The left ventricle has no regional wall motion abnormalities. Global longitudinal strain performed but not reported based on interpreter judgement due to suboptimal tracking. The left ventricular internal cavity size was normal in size. There is severe concentric left ventricular hypertrophy. Left ventricular diastolic parameters are indeterminate. Right Ventricle: The right ventricular size is normal. No increase in right ventricular wall thickness. Right ventricular systolic function is normal. Tricuspid regurgitation signal is inadequate for assessing PA pressure. Left Atrium: Left atrial size was mildly dilated. Right Atrium: Right atrial size was normal in size. Pericardium: A small pericardial effusion is present. The pericardial effusion is circumferential. Mitral Valve: The mitral valve is normal in structure. Trivial mitral valve regurgitation. No evidence of mitral valve stenosis. Tricuspid Valve: The tricuspid valve is normal in structure. Tricuspid valve regurgitation is mild . No evidence of tricuspid stenosis. Aortic Valve: The aortic valve is normal in structure. Aortic valve regurgitation  is not visualized. No aortic stenosis is present. Pulmonic Valve: The pulmonic valve was normal in structure. Pulmonic valve regurgitation is not visualized. No evidence of pulmonic stenosis. Aorta: The aortic root is normal in size and structure. Venous: The inferior vena cava is dilated in size  with greater than 50% respiratory variability, suggesting right atrial pressure of 8 mmHg. IAS/Shunts: No atrial level shunt detected by color flow Doppler.  LEFT VENTRICLE PLAX 2D LVIDd:         4.60 cm         Diastology LVIDs:         3.05 cm         LV e' medial:    7.40 cm/s LV PW:         1.90 cm         LV E/e' medial:  16.6 LV IVS:        1.40 cm         LV e' lateral:   8.38 cm/s LVOT diam:     2.30 cm         LV E/e' lateral: 14.7 LV SV:         76 LV SV Index:   36 LVOT Area:     4.15 cm        3D Volume EF                                LV 3D EF:    Left                                             ventricul                                             ar                                             ejection                                             fraction                                             by 3D                                             volume is                                             64 %.                                 3D Volume EF:  3D EF:        64 %                                LV EDV:       209 ml                                LV ESV:       76 ml                                LV SV:        133 ml RIGHT VENTRICLE             IVC RV S prime:     14.00 cm/s  IVC diam: 2.70 cm TAPSE (M-mode): 2.5 cm LEFT ATRIUM             Index        RIGHT ATRIUM           Index LA diam:        4.00 cm 1.89 cm/m   RA Area:     13.00 cm LA Vol (A2C):   81.1 ml 38.31 ml/m  RA Volume:   35.60 ml  16.82 ml/m LA Vol (A4C):   84.8 ml 40.06 ml/m LA Biplane Vol: 82.9 ml 39.16 ml/m  AORTIC VALVE LVOT Vmax:   121.00 cm/s LVOT Vmean:  76.500 cm/s LVOT VTI:    0.182 m  AORTA Ao  Root diam: 3.10 cm Ao Asc diam:  3.20 cm MITRAL VALVE MV Area (PHT): 5.27 cm     SHUNTS MV Decel Time: 144 msec     Systemic VTI:  0.18 m MV E velocity: 123.00 cm/s  Systemic Diam: 2.30 cm MV A velocity: 50.70 cm/s MV E/A ratio:  2.43 Kardie Tobb DO Electronically signed by Berniece Salines DO Signature Date/Time: 03/19/2022/4:33:56 PM    Final    CT Head Wo Contrast  Result Date: 03/18/2022 CLINICAL DATA:  Syncopal episode with fall from standing and abrasion to chin EXAM: CT HEAD WITHOUT CONTRAST CT MAXILLOFACIAL WITHOUT CONTRAST CT CERVICAL SPINE WITHOUT CONTRAST TECHNIQUE: Multidetector CT imaging of the head, cervical spine, and maxillofacial structures were performed using the standard protocol without intravenous contrast. Multiplanar CT image reconstructions of the cervical spine and maxillofacial structures were also generated. RADIATION DOSE REDUCTION: This exam was performed according to the departmental dose-optimization program which includes automated exposure control, adjustment of the mA and/or kV according to patient size and/or use of iterative reconstruction technique. COMPARISON:  CT orbits 07/09/2014 and CT head 02/09/2003; cervical spine radiographs 09/26/2013 FINDINGS: CT HEAD FINDINGS Brain: No evidence of acute infarction, hemorrhage, hydrocephalus, extra-axial collection or mass lesion/mass effect. Vascular: No hyperdense vessel or unexpected calcification. Skull: Normal. Negative for fracture or focal lesion. Other: None. CT MAXILLOFACIAL FINDINGS Osseous: No fracture or mandibular dislocation. No destructive process. Dental caries in a left mandibular molar. Orbits: Negative. No traumatic or inflammatory finding. Sinuses: Clear. Soft tissues: Abrasion/contusion to the right chin. CT CERVICAL SPINE FINDINGS Alignment: No evidence of traumatic malalignment. Skull base and vertebrae: No acute fracture. No primary bone lesion or focal pathologic process. Soft tissues and spinal canal: No  prevertebral fluid or swelling. No visible canal hematoma. Disc levels: Disc space height is maintained. No spinal canal or  neural foraminal narrowing. Upper chest: Negative. Other: None. IMPRESSION: 1. No acute intracranial abnormality. 2. No acute facial bone fracture. Abrasion/contusion to the right chin. 3. No acute cervical spine fracture. 4. Left mandibular molar dental carie. Electronically Signed   By: Placido Sou M.D.   On: 03/18/2022 23:38   CT Cervical Spine Wo Contrast  Result Date: 03/18/2022 CLINICAL DATA:  Syncopal episode with fall from standing and abrasion to chin EXAM: CT HEAD WITHOUT CONTRAST CT MAXILLOFACIAL WITHOUT CONTRAST CT CERVICAL SPINE WITHOUT CONTRAST TECHNIQUE: Multidetector CT imaging of the head, cervical spine, and maxillofacial structures were performed using the standard protocol without intravenous contrast. Multiplanar CT image reconstructions of the cervical spine and maxillofacial structures were also generated. RADIATION DOSE REDUCTION: This exam was performed according to the departmental dose-optimization program which includes automated exposure control, adjustment of the mA and/or kV according to patient size and/or use of iterative reconstruction technique. COMPARISON:  CT orbits 07/09/2014 and CT head 02/09/2003; cervical spine radiographs 09/26/2013 FINDINGS: CT HEAD FINDINGS Brain: No evidence of acute infarction, hemorrhage, hydrocephalus, extra-axial collection or mass lesion/mass effect. Vascular: No hyperdense vessel or unexpected calcification. Skull: Normal. Negative for fracture or focal lesion. Other: None. CT MAXILLOFACIAL FINDINGS Osseous: No fracture or mandibular dislocation. No destructive process. Dental caries in a left mandibular molar. Orbits: Negative. No traumatic or inflammatory finding. Sinuses: Clear. Soft tissues: Abrasion/contusion to the right chin. CT CERVICAL SPINE FINDINGS Alignment: No evidence of traumatic malalignment. Skull base  and vertebrae: No acute fracture. No primary bone lesion or focal pathologic process. Soft tissues and spinal canal: No prevertebral fluid or swelling. No visible canal hematoma. Disc levels: Disc space height is maintained. No spinal canal or neural foraminal narrowing. Upper chest: Negative. Other: None. IMPRESSION: 1. No acute intracranial abnormality. 2. No acute facial bone fracture. Abrasion/contusion to the right chin. 3. No acute cervical spine fracture. 4. Left mandibular molar dental carie. Electronically Signed   By: Placido Sou M.D.   On: 03/18/2022 23:38   CT Maxillofacial Wo Contrast  Result Date: 03/18/2022 CLINICAL DATA:  Syncopal episode with fall from standing and abrasion to chin EXAM: CT HEAD WITHOUT CONTRAST CT MAXILLOFACIAL WITHOUT CONTRAST CT CERVICAL SPINE WITHOUT CONTRAST TECHNIQUE: Multidetector CT imaging of the head, cervical spine, and maxillofacial structures were performed using the standard protocol without intravenous contrast. Multiplanar CT image reconstructions of the cervical spine and maxillofacial structures were also generated. RADIATION DOSE REDUCTION: This exam was performed according to the departmental dose-optimization program which includes automated exposure control, adjustment of the mA and/or kV according to patient size and/or use of iterative reconstruction technique. COMPARISON:  CT orbits 07/09/2014 and CT head 02/09/2003; cervical spine radiographs 09/26/2013 FINDINGS: CT HEAD FINDINGS Brain: No evidence of acute infarction, hemorrhage, hydrocephalus, extra-axial collection or mass lesion/mass effect. Vascular: No hyperdense vessel or unexpected calcification. Skull: Normal. Negative for fracture or focal lesion. Other: None. CT MAXILLOFACIAL FINDINGS Osseous: No fracture or mandibular dislocation. No destructive process. Dental caries in a left mandibular molar. Orbits: Negative. No traumatic or inflammatory finding. Sinuses: Clear. Soft tissues:  Abrasion/contusion to the right chin. CT CERVICAL SPINE FINDINGS Alignment: No evidence of traumatic malalignment. Skull base and vertebrae: No acute fracture. No primary bone lesion or focal pathologic process. Soft tissues and spinal canal: No prevertebral fluid or swelling. No visible canal hematoma. Disc levels: Disc space height is maintained. No spinal canal or neural foraminal narrowing. Upper chest: Negative. Other: None. IMPRESSION: 1. No acute intracranial abnormality. 2. No acute  facial bone fracture. Abrasion/contusion to the right chin. 3. No acute cervical spine fracture. 4. Left mandibular molar dental carie. Electronically Signed   By: Placido Sou M.D.   On: 03/18/2022 23:38   CT Angio Chest PE W and/or Wo Contrast  Result Date: 03/18/2022 CLINICAL DATA:  Syncope, shortness of breath. EXAM: CT ANGIOGRAPHY CHEST WITH CONTRAST TECHNIQUE: Multidetector CT imaging of the chest was performed using the standard protocol during bolus administration of intravenous contrast. Multiplanar CT image reconstructions and MIPs were obtained to evaluate the vascular anatomy. RADIATION DOSE REDUCTION: This exam was performed according to the departmental dose-optimization program which includes automated exposure control, adjustment of the mA and/or kV according to patient size and/or use of iterative reconstruction technique. CONTRAST:  80mL OMNIPAQUE IOHEXOL 350 MG/ML SOLN COMPARISON:  None Available. FINDINGS: Cardiovascular: The heart is enlarged and there is a trace pericardial effusion. The aorta is normal in caliber. The pulmonary trunk is mildly distended which may be associated with underlying pulmonary artery hypertension. No evidence of pulmonary embolism. Evaluation of the pulmonary arteries is limited due to respiratory motion artifact. Mediastinum/Nodes: No enlarged mediastinal, hilar, or axillary lymph nodes. Thyroid gland, trachea, and esophagus demonstrate no significant findings.  Lungs/Pleura: Mild hazy ground-glass attenuation is present in the lungs bilaterally. No consolidation, effusion, or pneumothorax. Upper Abdomen: No acute abnormality. Musculoskeletal: No acute osseous abnormality. Review of the MIP images confirms the above findings. IMPRESSION: 1. No evidence of pulmonary embolism. 2. Hazy ground-glass attenuation in the lungs, possible edema or pneumonitis. 3. Cardiomegaly. 4. Mildly distended pulmonary trunk which may be associated with underlying pulmonary artery hypertension. Electronically Signed   By: Brett Fairy M.D.   On: 03/18/2022 23:38   DG Pelvis 1-2 Views  Result Date: 03/18/2022 CLINICAL DATA:  fall injury EXAM: PELVIS - 1-2 VIEW COMPARISON:  None Available. FINDINGS: There is no evidence of pelvic fracture or diastasis. No hip dislocation bilaterally. No pelvic bone lesions are seen. IMPRESSION: Negative. Electronically Signed   By: Iven Finn M.D.   On: 03/18/2022 23:01   DG Wrist Complete Left  Result Date: 03/18/2022 CLINICAL DATA:  Fall/injury EXAM: LEFT WRIST - COMPLETE 3+ VIEW COMPARISON:  None Available. FINDINGS: There is no evidence of fracture or dislocation. There is no evidence of arthropathy or other focal bone abnormality. Soft tissues are unremarkable. IMPRESSION: Negative. Electronically Signed   By: Ronney Asters M.D.   On: 03/18/2022 23:00     Scheduled Meds:  Chlorhexidine Gluconate Cloth  6 each Topical Daily   ferrous sulfate  325 mg Oral BID WC   heparin  5,000 Units Subcutaneous Q8H   isosorbide mononitrate  30 mg Oral Daily   lisinopril  2.5 mg Oral Daily   medroxyPROGESTERone  150 mg Intramuscular Once   potassium chloride  40 mEq Oral BID   Continuous Infusions:     LOS: 1 day   Time spent: [place], MD [facility] To contact the attending provider between 7A-7P or the covering provider during after hours 7P-7A, please log into the web site [email or web address] and access using universal  [place] password for that web site. If you do not have the password, please call the hospital operator.  03/20/2022, 3:20 PM

## 2022-03-21 DIAGNOSIS — I10 Essential (primary) hypertension: Secondary | ICD-10-CM | POA: Diagnosis not present

## 2022-03-21 LAB — CBC WITH DIFFERENTIAL/PLATELET
Abs Immature Granulocytes: 0.08 10*3/uL — ABNORMAL HIGH (ref 0.00–0.07)
Basophils Absolute: 0.1 10*3/uL (ref 0.0–0.1)
Basophils Relative: 1 %
Eosinophils Absolute: 0.2 10*3/uL (ref 0.0–0.5)
Eosinophils Relative: 3 %
HCT: 30 % — ABNORMAL LOW (ref 36.0–46.0)
Hemoglobin: 8.1 g/dL — ABNORMAL LOW (ref 12.0–15.0)
Immature Granulocytes: 1 %
Lymphocytes Relative: 33 %
Lymphs Abs: 3.1 10*3/uL (ref 0.7–4.0)
MCH: 19.5 pg — ABNORMAL LOW (ref 26.0–34.0)
MCHC: 27 g/dL — ABNORMAL LOW (ref 30.0–36.0)
MCV: 72.1 fL — ABNORMAL LOW (ref 80.0–100.0)
Monocytes Absolute: 0.7 10*3/uL (ref 0.1–1.0)
Monocytes Relative: 7 %
Neutro Abs: 5.2 10*3/uL (ref 1.7–7.7)
Neutrophils Relative %: 55 %
Platelets: 183 10*3/uL (ref 150–400)
RBC: 4.16 MIL/uL (ref 3.87–5.11)
RDW: 22.5 % — ABNORMAL HIGH (ref 11.5–15.5)
WBC: 9.4 10*3/uL (ref 4.0–10.5)
nRBC: 1 % — ABNORMAL HIGH (ref 0.0–0.2)

## 2022-03-21 LAB — BASIC METABOLIC PANEL
Anion gap: 9 (ref 5–15)
BUN: 17 mg/dL (ref 6–20)
CO2: 25 mmol/L (ref 22–32)
Calcium: 8.9 mg/dL (ref 8.9–10.3)
Chloride: 103 mmol/L (ref 98–111)
Creatinine, Ser: 1.04 mg/dL — ABNORMAL HIGH (ref 0.44–1.00)
GFR, Estimated: >60 mL/min (ref >60)
Glucose, Bld: 89 mg/dL (ref 70–99)
Potassium: 4.4 mmol/L (ref 3.5–5.1)
Sodium: 137 mmol/L (ref 135–145)

## 2022-03-21 MED ORDER — FERROUS SULFATE 325 (65 FE) MG PO TABS: 325.0000 mg | ORAL_TABLET | Freq: Two times a day (BID) | ORAL | 3 refills | Status: DC

## 2022-03-21 MED ORDER — ISOSORBIDE MONONITRATE ER 30 MG PO TB24: 30.0000 mg | ORAL_TABLET | Freq: Every day | ORAL | 3 refills | Status: DC

## 2022-03-21 MED ORDER — LISINOPRIL 2.5 MG PO TABS: 2.5000 mg | ORAL_TABLET | Freq: Every day | ORAL | 3 refills | Status: DC

## 2022-03-21 NOTE — Discharge Summary (Signed)
Physician Discharge Summary  Anita [name] [id number] DOB: November 25, 1990 DOA: 03/18/2022  PCP: Patient, No Pcp Per  Admit date: 03/18/2022 Discharge date: 03/21/2022  Time spent: 50 minutes  Recommendations for Outpatient Follow-up:  Needs outpatient PCP appointment and Chem-12 CBC in 2 to 3 weeks Recommend iron studies in about a month Requires?  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 Menorrhagia Grand multiparity  Please see below for itemized issues addressed in Anita [name]- refer to other progress notes for clarity if needed  Discharge Condition: Improved  Diet recommendation: Heart healthy  Filed Weights   03/19/22 0358  Weight: 103.7 kg    History of present illness:  32 year old home dwelling female (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  Heavy postpartum bleeding Came to emergency room with episodic syncope as well as shortness of breath as well as tunnel vision Syncope caused her to hit her chin on the floor BP 193/127 heart rate 87 CT chest-no PE, Cardiomegaly, distended pulm trunk   Hemoglobin 7, iron level 20, saturation was 4, MCV 67, RDW 20-polychromasia noted BNP 283, troponin [phone]   3/14 I U PRBC, ECHO ef 64%-severe concentric hypertrophy, renal duplex--no stenosis  Hospital Course:  Syncope on admission secondary to high-output heart failure from progressive anemia with menorrhagia - Transfused as  above, IV iron given also during hospitalization - Will need outpatient follow-up with PCP set-up, does require discussion about durable contraceptive management-?  Nexplanon - echo as above-requires repeat echo as per PCP or in 3 months   Menorrhagia -d/w GYN, who ok the use of Depo-provera, do not recommend estrogen contaning OCP given very high BP -needs set up with OB-GYN as OP?  Syncope/fall and bruise to chin  Explained by potential hypertensive emergency and also anemia - Requires possible ACE inhibitor given UA showing micro proteinuria - Progressive lowering of blood pressure --transiently on labetalol gtt. - Continue lisinopril 2.5, Isordil 5 2 times daily -Requires outpatient labs   Elevated troponin, elevated BNP - Likely secondary to severe hypertension, no chest pain whatsoever-no further workup   Severe hypokalemia -Kdur BId, get am mag and replace again in am if prn   BMI 36 - Needs durable weight loss strategies in the outpatient setting   Discharge Exam: Vitals:   03/20/22 2049 03/21/22 0455  BP: 138/88 (!) 138/93  Pulse: 75 73  Resp: 17 17  Temp: 98.3 F (36.8 C) 98.2 F (36.8 C)  SpO2: 95% 97%    Subj on day of d/c   Weight pleasant ambulated around the unit no distress  General Exam on discharge  EOMI NCAT wound on chin looks clean Wound on knees looks clean CTAB no added sound no rales no rhonchi no wheeze Abdomen soft no rebound no guarding ROM intact   Discharge Instructions   Discharge Instructions     Diet - low sodium heart healthy   Complete by: As directed    Discharge instructions   Complete by: As directed    Please ensure that you follow-up with PCP-we  have arranged via case manager to get you an appointment with one of the centers in town for you to get close follow-up of your blood pressure You will need close follow-up with OB/GYN--- you have menometrorrhagia which means that you have very heavy cyclical bleeding --u  received ashot of medication called Depo-Provera --this will prevent you from having severe bleeding for about a month to month and a half -your new primary care physician  Anita [name] he sees you can make some decisions]  it may be preferable to get an ultrasound in the outpatient setting to ensure that you do not have uterine fibroids which is the most common cause of dysfunctional uterine bleeding It would be a good idea that you take the 2 medications that we have prescribed called into your as well as lisinopril at this time and make sure that you get labs in about a week to week and a half as some of these medications affect your blood pressure  Keep the chin and knee wounds dry, they will heal  Good ,luck, and have a nice summer   Increase activity slowly   Complete by: As directed    No wound care   Complete by: As directed       Allergies as of 03/21/2022   No Known Allergies      Medication List     TAKE these medications    ferrous sulfate 325 (65 FE) MG tablet Take 1 tablet (325 mg total) by mouth 2 (two) times daily with a meal.   isosorbide mononitrate 30 MG 24 hr tablet Commonly known as: IMDUR Take 1 tablet (30 mg total) by mouth daily.   lisinopril 2.5 MG tablet Commonly known as: ZESTRIL Take 1 tablet (2.5 mg total) by mouth daily.       No Known Allergies  Follow-up Information     [facility]. Go on 03/23/2022.   Why: Go to your appointment scheduled on 03/23/22 at 9:30am. If you are not able to make this appointment please contact the listed phone number. Take your medications list and discharge paperwork. Contact information: [address] [place] [phone]                 The results of significant diagnostics from this hospitalization (including imaging, microbiology, ancillary and laboratory) are listed below for reference.    Significant Diagnostic Studies: VAS US  RENAL ARTERY DUPLEX  Result Date: 03/19/2022 ABDOMINAL VISCERAL Patient Name:  Anita [name]  Date of Exam:   03/19/2022 Medical Rec #: [id number]        Accession #:    [id number] Date of Birth: Apr 15, 1990        Patient Gender: F Patient Age:   32 years Exam Location[facility]  [facility] Procedure:      VAS US RENAL ARTERY DUPLEX Referring Phys: [place] -------------------------------------------------------------------------------- Indications: Malignant hypertension Other Factors: Family history of hypertension. Comparison Study: No prior studies. Performing Technologist: Darlin Coco RDMS, RVT  Examination Guidelines: A complete evaluation includes B-mode imaging, spectral Doppler, color Doppler, and power Doppler as needed of all accessible portions of each vessel. Bilateral testing is considered an integral part of a complete examination. Limited examinations for reoccurring indications may be performed as noted.  Duplex Findings: +----------------------+--------+--------+------+--------+ Mesenteric            PSV cm/sEDV cm/sPlaqueComments +----------------------+--------+--------+------+--------+ Aorta Mid  78                          +----------------------+--------+--------+------+--------+ Celiac Artery Proximal  200                          +----------------------+--------+--------+------+--------+ SMA Proximal            127                          +----------------------+--------+--------+------+--------+    +------------------+--------+--------+-------+ Right Renal ArteryPSV cm/sEDV cm/sComment +------------------+--------+--------+-------+ Origin               86      27           +------------------+--------+--------+-------+ Proximal             76      25           +------------------+--------+--------+-------+ Mid                  97      32           +------------------+--------+--------+-------+ Distal               72       21           +------------------+--------+--------+-------+ +-----------------+--------+--------+-------+ Left Renal ArteryPSV cm/sEDV cm/sComment +-----------------+--------+--------+-------+ Origin              74      18           +-----------------+--------+--------+-------+ Proximal            82      22           +-----------------+--------+--------+-------+ Mid                 55      17           +-----------------+--------+--------+-------+ Distal              49      15           +-----------------+--------+--------+-------+ +------------+--------+--------+----+-----------+--------+--------+----+ Right KidneyPSV cm/sEDV cm/sRI  Left KidneyPSV cm/sEDV cm/sRI   +------------+--------+--------+----+-----------+--------+--------+----+ Upper Pole  14      6       0.57Upper Pole 33      14      0.58 +------------+--------+--------+----+-----------+--------+--------+----+ Mid         33      10      0.69Mid        35      14      0.60 +------------+--------+--------+----+-----------+--------+--------+----+ Lower Pole  17      6       0.65Lower Pole 34      14      0.57 +------------+--------+--------+----+-----------+--------+--------+----+ Hilar       61      18      0.71Hilar      23      7       0.69 +------------+--------+--------+----+-----------+--------+--------+----+ +------------------+-----+------------------+-----+ Right Kidney           Left Kidney             +------------------+-----+------------------+-----+ RAR                    RAR                     +------------------+-----+------------------+-----+  RAR (manual)      1.24 RAR (manual)      1.05  +------------------+-----+------------------+-----+ Cortex                 Cortex                  +------------------+-----+------------------+-----+ Cortex thickness       Corex thickness         +------------------+-----+------------------+-----+  Kidney length (cm)10.92Kidney length (cm)11.55 +------------------+-----+------------------+-----+  Summary: Renal:  Right: No evidence of right renal artery stenosis. Normal right        Resisitive Index. Normal size right kidney. RRV flow present. Left:  No evidence of left renal artery stenosis. Normal left        Resistive Index. Normal size of left kidney. LRV flow        present. Mesenteric: Normal Celiac artery and Superior Mesenteric artery findings.  *See table(s) above for measurements and observations.  Diagnosing physician: Orlie Pollen  Electronically signed by Orlie Pollen on 03/19/2022 at 5:32:28 PM.    Final    ECHOCARDIOGRAM COMPLETE  Result Date: 03/19/2022    ECHOCARDIOGRAM REPORT   Patient Name:   CHARMAINE GESSEL Date of Exam: 03/19/2022 Medical Rec #:  [id number]       Height:       66.0 in Accession #:    [id number]      Weight:       228.6 lb Date of Birth:  1990-01-12       BSA:          2.117 m Patient Age:    31 years        BP:           160/98 mmHg Patient Gender: F               HR:           78 bpm. Exam Location:  Inpatient Procedure: 2D Echo, Cardiac Doppler, Color Doppler, Strain Analysis and 3D Echo Indications:    I51.7 Cardiomegaly  History:        Patient has no prior history of Echocardiogram examinations.                 Cardiomyopathy, Signs/Symptoms:Chest Pain; Risk                 Factors:Hypertension and Current Smoker.  Sonographer:    Roseanna Rainbow RDCS Referring Phys: [id number] ERIC CHEN  Sonographer Comments: Image acquisition challenging due to patient body habitus. IMPRESSIONS  1. Left ventricular ejection fraction by 3D volume is 64 %. The left ventricle has normal function. The left ventricle has no regional wall motion abnormalities. There is severe concentric left ventricular hypertrophy. Left ventricular diastolic parameters are indeterminate.  2. Right ventricular systolic function is normal. The right ventricular size is normal. Tricuspid regurgitation signal is  inadequate for assessing PA pressure.  3. Left atrial size was mildly dilated.  4. A small pericardial effusion is present. The pericardial effusion is circumferential.  5. The mitral valve is normal in structure. Trivial mitral valve regurgitation. No evidence of mitral stenosis.  6. The aortic valve is normal in structure. Aortic valve regurgitation is not visualized. No aortic stenosis is present.  7. The inferior vena cava is dilated in size with >50% respiratory variability, suggesting right atrial pressure of 8 mmHg. FINDINGS  Left Ventricle: Left ventricular ejection fraction by 3D volume is 64 %. The left ventricle has normal function. The left ventricle  has no regional wall motion abnormalities. Global longitudinal strain performed but not reported based on interpreter judgement due to suboptimal tracking. The left ventricular internal cavity size was normal in size. There is severe concentric left ventricular hypertrophy. Left ventricular diastolic parameters are indeterminate. Right Ventricle: The right ventricular size is normal. No increase in right ventricular wall thickness. Right ventricular systolic function is normal. Tricuspid regurgitation signal is inadequate for assessing PA pressure. Left Atrium: Left atrial size was mildly dilated. Right Atrium: Right atrial size was normal in size. Pericardium: A small pericardial effusion is present. The pericardial effusion is circumferential. Mitral Valve: The mitral valve is normal in structure. Trivial mitral valve regurgitation. No evidence of mitral valve stenosis. Tricuspid Valve: The tricuspid valve is normal in structure. Tricuspid valve regurgitation is mild . No evidence of tricuspid stenosis. Aortic Valve: The aortic valve is normal in structure. Aortic valve regurgitation is not visualized. No aortic stenosis is present. Pulmonic Valve: The pulmonic valve was normal in structure. Pulmonic valve regurgitation is not visualized. No evidence of  pulmonic stenosis. Aorta: The aortic root is normal in size and structure. Venous: The inferior vena cava is dilated in size with greater than 50% respiratory variability, suggesting right atrial pressure of 8 mmHg. IAS/Shunts: No atrial level shunt detected by color flow Doppler.  LEFT VENTRICLE PLAX 2D LVIDd:         4.60 cm         Diastology LVIDs:         3.05 cm         LV e' medial:    7.40 cm/s LV PW:         1.90 cm         LV E/e' medial:  16.6 LV IVS:        1.40 cm         LV e' lateral:   8.38 cm/s LVOT diam:     2.30 cm         LV E/e' lateral: 14.7 LV SV:         76 LV SV Index:   36 LVOT Area:     4.15 cm        3D Volume EF                                LV 3D EF:    Left                                             ventricul                                             ar                                             ejection  fraction                                             by 3D                                             volume is                                             64 %.                                 3D Volume EF:                                3D EF:        64 %                                LV EDV:       209 ml                                LV ESV:       76 ml                                LV SV:        133 ml RIGHT VENTRICLE             IVC RV S prime:     14.00 cm/s  IVC diam: 2.70 cm TAPSE (M-mode): 2.5 cm LEFT ATRIUM             Index        RIGHT ATRIUM           Index LA diam:        4.00 cm 1.89 cm/m   RA Area:     13.00 cm LA Vol (A2C):   81.1 ml 38.31 ml/m  RA Volume:   35.60 ml  16.82 ml/m LA Vol (A4C):   84.8 ml 40.06 ml/m LA Biplane Vol: 82.9 ml 39.16 ml/m  AORTIC VALVE LVOT Vmax:   121.00 cm/s LVOT Vmean:  76.500 cm/s LVOT VTI:    0.182 m  AORTA Ao Root diam: 3.10 cm Ao Asc diam:  3.20 cm MITRAL VALVE MV Area (PHT): 5.27 cm     SHUNTS MV Decel Time: 144 msec     Systemic VTI:  0.18 m MV E velocity: 123.00 cm/s   Systemic Diam: 2.30 cm MV A velocity: 50.70 cm/s MV E/A ratio:  2.43 Kardie Tobb DO Electronically signed by Berniece Salines DO Signature Date/Time: 03/19/2022/4:33:56 PM    Final    CT Head Wo Contrast  Result Date: 03/18/2022 CLINICAL DATA:  Syncopal episode with fall from standing and abrasion to chin EXAM: CT HEAD WITHOUT CONTRAST CT MAXILLOFACIAL WITHOUT CONTRAST CT CERVICAL SPINE  WITHOUT CONTRAST TECHNIQUE: Multidetector CT imaging of the head, cervical spine, and maxillofacial structures were performed using the standard protocol without intravenous contrast. Multiplanar CT image reconstructions of the cervical spine and maxillofacial structures were also generated. RADIATION DOSE REDUCTION: This exam was performed according to the departmental dose-optimization program which includes automated exposure control, adjustment of the mA and/or kV according to patient size and/or use of iterative reconstruction technique. COMPARISON:  CT orbits 07/09/2014 and CT head 02/09/2003; cervical spine radiographs 09/26/2013 FINDINGS: CT HEAD FINDINGS Brain: No evidence of acute infarction, hemorrhage, hydrocephalus, extra-axial collection or mass lesion/mass effect. Vascular: No hyperdense vessel or unexpected calcification. Skull: Normal. Negative for fracture or focal lesion. Other: None. CT MAXILLOFACIAL FINDINGS Osseous: No fracture or mandibular dislocation. No destructive process. Dental caries in a left mandibular molar. Orbits: Negative. No traumatic or inflammatory finding. Sinuses: Clear. Soft tissues: Abrasion/contusion to the right chin. CT CERVICAL SPINE FINDINGS Alignment: No evidence of traumatic malalignment. Skull base and vertebrae: No acute fracture. No primary bone lesion or focal pathologic process. Soft tissues and spinal canal: No prevertebral fluid or swelling. No visible canal hematoma. Disc levels: Disc space height is maintained. No spinal canal or neural foraminal narrowing. Upper chest:  Negative. Other: None. IMPRESSION: 1. No acute intracranial abnormality. 2. No acute facial bone fracture. Abrasion/contusion to the right chin. 3. No acute cervical spine fracture. 4. Left mandibular molar dental carie. Electronically Signed   By: Placido Sou M.D.   On: 03/18/2022 23:38   CT Cervical Spine Wo Contrast  Result Date: 03/18/2022 CLINICAL DATA:  Syncopal episode with fall from standing and abrasion to chin EXAM: CT HEAD WITHOUT CONTRAST CT MAXILLOFACIAL WITHOUT CONTRAST CT CERVICAL SPINE WITHOUT CONTRAST TECHNIQUE: Multidetector CT imaging of the head, cervical spine, and maxillofacial structures were performed using the standard protocol without intravenous contrast. Multiplanar CT image reconstructions of the cervical spine and maxillofacial structures were also generated. RADIATION DOSE REDUCTION: This exam was performed according to the departmental dose-optimization program which includes automated exposure control, adjustment of the mA and/or kV according to patient size and/or use of iterative reconstruction technique. COMPARISON:  CT orbits 07/09/2014 and CT head 02/09/2003; cervical spine radiographs 09/26/2013 FINDINGS: CT HEAD FINDINGS Brain: No evidence of acute infarction, hemorrhage, hydrocephalus, extra-axial collection or mass lesion/mass effect. Vascular: No hyperdense vessel or unexpected calcification. Skull: Normal. Negative for fracture or focal lesion. Other: None. CT MAXILLOFACIAL FINDINGS Osseous: No fracture or mandibular dislocation. No destructive process. Dental caries in a left mandibular molar. Orbits: Negative. No traumatic or inflammatory finding. Sinuses: Clear. Soft tissues: Abrasion/contusion to the right chin. CT CERVICAL SPINE FINDINGS Alignment: No evidence of traumatic malalignment. Skull base and vertebrae: No acute fracture. No primary bone lesion or focal pathologic process. Soft tissues and spinal canal: No prevertebral fluid or swelling. No visible  canal hematoma. Disc levels: Disc space height is maintained. No spinal canal or neural foraminal narrowing. Upper chest: Negative. Other: None. IMPRESSION: 1. No acute intracranial abnormality. 2. No acute facial bone fracture. Abrasion/contusion to the right chin. 3. No acute cervical spine fracture. 4. Left mandibular molar dental carie. Electronically Signed   By: Placido Sou M.D.   On: 03/18/2022 23:38   CT Maxillofacial Wo Contrast  Result Date: 03/18/2022 CLINICAL DATA:  Syncopal episode with fall from standing and abrasion to chin EXAM: CT HEAD WITHOUT CONTRAST CT MAXILLOFACIAL WITHOUT CONTRAST CT CERVICAL SPINE WITHOUT CONTRAST TECHNIQUE: Multidetector CT imaging of the head, cervical spine, and maxillofacial structures were performed using  the standard protocol without intravenous contrast. Multiplanar CT image reconstructions of the cervical spine and maxillofacial structures were also generated. RADIATION DOSE REDUCTION: This exam was performed according to the departmental dose-optimization program which includes automated exposure control, adjustment of the mA and/or kV according to patient size and/or use of iterative reconstruction technique. COMPARISON:  CT orbits 07/09/2014 and CT head 02/09/2003; cervical spine radiographs 09/26/2013 FINDINGS: CT HEAD FINDINGS Brain: No evidence of acute infarction, hemorrhage, hydrocephalus, extra-axial collection or mass lesion/mass effect. Vascular: No hyperdense vessel or unexpected calcification. Skull: Normal. Negative for fracture or focal lesion. Other: None. CT MAXILLOFACIAL FINDINGS Osseous: No fracture or mandibular dislocation. No destructive process. Dental caries in a left mandibular molar. Orbits: Negative. No traumatic or inflammatory finding. Sinuses: Clear. Soft tissues: Abrasion/contusion to the right chin. CT CERVICAL SPINE FINDINGS Alignment: No evidence of traumatic malalignment. Skull base and vertebrae: No acute fracture. No primary  bone lesion or focal pathologic process. Soft tissues and spinal canal: No prevertebral fluid or swelling. No visible canal hematoma. Disc levels: Disc space height is maintained. No spinal canal or neural foraminal narrowing. Upper chest: Negative. Other: None. IMPRESSION: 1. No acute intracranial abnormality. 2. No acute facial bone fracture. Abrasion/contusion to the right chin. 3. No acute cervical spine fracture. 4. Left mandibular molar dental carie. Electronically Signed   By: Placido Sou M.D.   On: 03/18/2022 23:38   CT Angio Chest PE W and/or Wo Contrast  Result Date: 03/18/2022 CLINICAL DATA:  Syncope, shortness of breath. EXAM: CT ANGIOGRAPHY CHEST WITH CONTRAST TECHNIQUE: Multidetector CT imaging of the chest was performed using the standard protocol during bolus administration of intravenous contrast. Multiplanar CT image reconstructions and MIPs were obtained to evaluate the vascular anatomy. RADIATION DOSE REDUCTION: This exam was performed according to the departmental dose-optimization program which includes automated exposure control, adjustment of the mA and/or kV according to patient size and/or use of iterative reconstruction technique. CONTRAST:  53mL OMNIPAQUE IOHEXOL 350 MG/ML SOLN COMPARISON:  None Available. FINDINGS: Cardiovascular: The heart is enlarged and there is a trace pericardial effusion. The aorta is normal in caliber. The pulmonary trunk is mildly distended which may be associated with underlying pulmonary artery hypertension. No evidence of pulmonary embolism. Evaluation of the pulmonary arteries is limited due to respiratory motion artifact. Mediastinum/Nodes: No enlarged mediastinal, hilar, or axillary lymph nodes. Thyroid gland, trachea, and esophagus demonstrate no significant findings. Lungs/Pleura: Mild hazy ground-glass attenuation is present in the lungs bilaterally. No consolidation, effusion, or pneumothorax. Upper Abdomen: No acute abnormality.  Musculoskeletal: No acute osseous abnormality. Review of the MIP images confirms the above findings. IMPRESSION: 1. No evidence of pulmonary embolism. 2. Hazy ground-glass attenuation in the lungs, possible edema or pneumonitis. 3. Cardiomegaly. 4. Mildly distended pulmonary trunk which may be associated with underlying pulmonary artery hypertension. Electronically Signed   By: Brett Fairy M.D.   On: 03/18/2022 23:38   DG Pelvis 1-2 Views  Result Date: 03/18/2022 CLINICAL DATA:  fall injury EXAM: PELVIS - 1-2 VIEW COMPARISON:  None Available. FINDINGS: There is no evidence of pelvic fracture or diastasis. No hip dislocation bilaterally. No pelvic bone lesions are seen. IMPRESSION: Negative. Electronically Signed   By: Iven Finn M.D.   On: 03/18/2022 23:01   DG Wrist Complete Left  Result Date: 03/18/2022 CLINICAL DATA:  Fall/injury EXAM: LEFT WRIST - COMPLETE 3+ VIEW COMPARISON:  None Available. FINDINGS: There is no evidence of fracture or dislocation. There is no evidence of arthropathy or other focal  bone abnormality. Soft tissues are unremarkable. IMPRESSION: Negative. Electronically Signed   By: Ronney Asters M.D.   On: 03/18/2022 23:00    Microbiology: Recent Results (from the past 240 hour(s))  MRSA Next Gen by PCR, Nasal     Status: None   Collection Time: 03/19/22  3:06 AM   Specimen: Nasal Mucosa; Nasal Swab  Result Value Ref Range Status   MRSA by PCR Next Gen NOT DETECTED NOT DETECTED Final    Comment: (NOTE) The GeneXpert MRSA Assay (FDA approved for NASAL specimens only), is one component of a comprehensive MRSA colonization surveillance program. It is not intended to diagnose MRSA infection nor to guide or monitor treatment for MRSA infections. Test performance is not FDA approved in patients less than 33 years old. Performed at [facility], [place] [address], [place], [place] [address]      Labs: Basic Metabolic Panel: Recent Labs  Lab  03/18/22 2155 03/19/22 0456 03/20/22 0332 03/21/22 0544  NA 134* 138 136 137  K 3.7 3.2* 2.9* 4.4  CL 102 101 102 103  CO2 24 26 25 25   GLUCOSE 93 124* 87 89  BUN 23* 22* 18 17  CREATININE 1.22* 1.17* 1.17* 1.04*  CALCIUM 8.5* 8.8* 8.4* 8.9  MG  --  2.0  --   --    Liver Function Tests: Recent Labs  Lab 03/18/22 2155 03/19/22 0456  AST 24 23  ALT 21 20  ALKPHOS 65 60  BILITOT 0.8 0.6  PROT 7.6 7.0  ALBUMIN 4.1 3.8   No results for input(s): "LIPASE", "AMYLASE" in the last 168 hours. No results for input(s): "AMMONIA" in the last 168 hours. CBC: Recent Labs  Lab 03/18/22 2155 03/19/22 0456 03/19/22 1445 03/20/22 0332 03/21/22 0544  WBC 9.4 8.4  --  7.4 9.4  NEUTROABS 7.5 6.2  --  3.9 5.2  HGB 7.6* 7.0* 7.9* 7.6* 8.1*  HCT 28.9* 26.2* 28.9* 28.3* 30.0*  MCV 68.5* 67.9*  --  71.5* 72.1*  PLT 190 174  --  166 183   Cardiac Enzymes: No results for input(s): "CKTOTAL", "CKMB", "CKMBINDEX", "TROPONINI" in the last 168 hours. BNP: BNP (last 3 results) Recent Labs    03/18/22 2155  BNP 283.0*    ProBNP (last 3 results) No results for input(s): "PROBNP" in the last 8760 hours.  CBG: Recent Labs  Lab 03/18/22 2056  GLUCAP 92       Signed:  Nita Sells MD   [facility] 03/21/2022, 9:53 AM

## 2022-03-22 LAB — ALDOSTERONE + RENIN ACTIVITY W/ RATIO
ALDO / PRA Ratio: <5.6 (ref 0.0–30.0)
Aldosterone: <1 ng/dL (ref 0.0–30.0)
PRA LC/MS/MS: 0.179 ng/mL/hr (ref 0.167–5.380)

## 2022-03-23 ENCOUNTER — Inpatient Hospital Stay: Payer: Medicaid Other | Admitting: Family Medicine

## 2022-04-13 ENCOUNTER — Inpatient Hospital Stay: Payer: Medicaid Other | Admitting: Nurse Practitioner

## 2022-04-24 ENCOUNTER — Telehealth: Payer: Self-pay | Admitting: General Practice

## 2022-04-24 NOTE — Telephone Encounter (Signed)
Copied from CRM [id number]. Topic: Appointment Scheduling - Scheduling Inquiry for Clinic >> Apr 24, 2022 10:58 AM Patsy Lager T wrote: Reason for CRM: Patient had her BP taken at her job and was advise to see her PCP ASAP because her BP was 207/107 but she has not taken it since

## 2022-04-24 NOTE — Telephone Encounter (Signed)
Patient identified by name and date of birth.   Stated that she went to [facility] [address] for her elevated BP.

## 2022-04-25 NOTE — Telephone Encounter (Signed)
Patient has not established with me. She is not currently my patient. Just fyi

## 2022-05-08 ENCOUNTER — Ambulatory Visit: Payer: Medicaid Other | Attending: Nurse Practitioner | Admitting: Nurse Practitioner

## 2022-05-08 ENCOUNTER — Encounter: Payer: Self-pay | Admitting: Nurse Practitioner

## 2022-05-08 VITALS — BP 198/135 | HR 97 | Ht 66.0 in | Wt 248.6 lb

## 2022-05-08 DIAGNOSIS — Z1159 Encounter for screening for other viral diseases: Secondary | ICD-10-CM

## 2022-05-08 DIAGNOSIS — Z7689 Persons encountering health services in other specified circumstances: Secondary | ICD-10-CM | POA: Diagnosis not present

## 2022-05-08 DIAGNOSIS — I10 Essential (primary) hypertension: Secondary | ICD-10-CM

## 2022-05-08 DIAGNOSIS — D649 Anemia, unspecified: Secondary | ICD-10-CM | POA: Diagnosis not present

## 2022-05-08 DIAGNOSIS — N920 Excessive and frequent menstruation with regular cycle: Secondary | ICD-10-CM | POA: Diagnosis not present

## 2022-05-08 MED ORDER — IBUPROFEN 800 MG PO TABS: 800.0000 mg | ORAL_TABLET | Freq: Three times a day (TID) | ORAL | 3 refills | Status: DC | PRN

## 2022-05-08 MED ORDER — ISOSORBIDE MONONITRATE ER 60 MG PO TB24: 60.0000 mg | ORAL_TABLET | Freq: Every day | ORAL | 1 refills | Status: DC

## 2022-05-08 MED ORDER — BLOOD PRESSURE MONITOR DEVI: 0 refills | Status: DC

## 2022-05-08 NOTE — Progress Notes (Signed)
Assessment & Plan:  Anita [name] was seen today for establish care.  Diagnoses and all orders for this visit:  Encounter to establish care  Primary hypertension Increase Imdur to 60 mg Return in 4 weeks for BP Check -     CMP14+EGFR -     Blood Pressure Monitor DEVI; Please provide patient with insurance approved blood pressure monitor -     isosorbide mononitrate (IMDUR) 60 MG 24 hr tablet; Take 1 tablet (60 mg total) by mouth daily. -     ibuprofen (ADVIL) 800 MG tablet; Take 1 tablet (800 mg total) by mouth every 8 (eight) hours as needed.  Anemia, unspecified type -     CBC with Differential -     Iron, TIBC and Ferritin Panel  Need for hepatitis C screening test -     HCV Ab w Reflex to Quant PCR  Menorrhagia with regular cycle -     Ambulatory referral to Gynecology    Patient has been counseled on age-appropriate routine health concerns for screening and prevention. These are reviewed and up-to-date. Referrals have been placed accordingly. Immunizations are up-to-date or declined.    Subjective:   Chief Complaint  Patient presents with   Establish Care   HPI Anita [name] 32 y.o. female presents to office today to establish care and for follow up to HTN   Patient has been counseled on age-appropriate routine health concerns for screening and prevention. These are reviewed and up-to-date. Referrals have been placed accordingly. Immunizations are up-to-date or declined.     PAP SMEAR: OVERDUE. Referred to GYN   AUB She has a history of anemia related to chronic blood loss (likely menorrhagia). Anemia dates back to 2013. She states her menstrual cycle lasts 5 days with heaviest days on day 2-3.   HTN Blood pressure is poorly controlled. She is currently taking imdur 30 mg and lisinopril 2.5 mg in the am. Also wakes up with morning headaches 2 weeks ago.  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  HENT: Negative.  Negative for nosebleeds.   Eyes: Negative.  Negative for blurred vision, double vision and photophobia.  Respiratory: Negative.  Negative for cough and shortness of breath.   Cardiovascular: Negative.  Negative for chest pain, palpitations and leg swelling.  Gastrointestinal: Negative.  Negative for heartburn, nausea and vomiting.  Musculoskeletal: Negative.  Negative for myalgias.  Neurological: Negative.  Negative for dizziness, focal weakness, seizures and headaches.  Psychiatric/Behavioral: Negative.  Negative for suicidal ideas.     Past Medical History:  Diagnosis Date   Anemia    Arthritis    Gout    Hypertension     Past Surgical History:  Procedure Laterality Date   KNEE SURGERY     LEG SURGERY      Family History  Problem Relation Age of Onset   Hypertension Mother    Aneurysm Father    Hypertension Sister    Alcohol abuse Neg Hx  Arthritis Neg Hx    Asthma Neg Hx    Birth defects Neg Hx    Cancer Neg Hx    COPD Neg Hx    Depression Neg Hx    Diabetes Neg Hx    Drug abuse Neg Hx    Early death Neg Hx    Hearing loss Neg Hx    Heart disease Neg Hx    Hyperlipidemia Neg Hx    Kidney disease Neg Hx    Learning disabilities Neg Hx    Mental illness Neg Hx    Mental retardation Neg Hx    Miscarriages / Stillbirths Neg Hx    Stroke Neg Hx    Vision loss Neg Hx     Social History Reviewed with no changes to be made today.   Outpatient Medications Prior to  Visit  Medication Sig Dispense Refill   ferrous sulfate 325 (65 FE) MG tablet Take 1 tablet (325 mg total) by mouth 2 (two) times daily with a meal. 30 tablet 3   lisinopril (ZESTRIL) 2.5 MG tablet Take 1 tablet (2.5 mg total) by mouth daily. 30 tablet 3   isosorbide mononitrate (IMDUR) 30 MG 24 hr tablet Take 1 tablet (30 mg total) by mouth daily. 30 tablet 3   No facility-administered medications prior to visit.    No Known Allergies     Objective:    BP (!) 198/135 (BP Location: Left Arm, Patient Position: Sitting, Cuff Size: Large)   Pulse 97   Ht 5\' 6"  (1.676 m)   Wt 248 lb 9.6 oz (112.8 kg)   LMP 05/07/2022 (Exact Date)   SpO2 (!) 81%   BMI 40.13 kg/m  Wt Readings from Last 3 Encounters:  05/08/22 248 lb 9.6 oz (112.8 kg)  03/19/22 228 lb 9.9 oz (103.7 kg)  03/13/15 230 lb (104.3 kg)    Physical Exam Vitals and nursing note reviewed.  Constitutional:      Appearance: She is well-developed. She is obese.  HENT:     Head: Normocephalic and atraumatic.  Cardiovascular:     Rate and Rhythm: Normal rate and regular rhythm.     Heart sounds: Normal heart sounds. No murmur heard.    No friction rub. No gallop.  Pulmonary:     Effort: Pulmonary effort is normal. No tachypnea or respiratory distress.     Breath sounds: Normal breath sounds. No decreased breath sounds, wheezing, rhonchi or rales.  Chest:     Chest wall: No tenderness.  Abdominal:     General: Bowel sounds are normal.     Palpations: Abdomen is soft.  Musculoskeletal:        General: Normal range of motion.     Cervical back: Normal range of motion.  Skin:    General: Skin is warm and dry.  Neurological:     Mental Status: She is alert and oriented to person, place, and time.     Coordination: Coordination normal.  Psychiatric:        Behavior: Behavior normal. Behavior is cooperative.        Thought Content: Thought content normal.        Judgment: Judgment normal.          Patient has been  counseled extensively about nutrition and exercise as well as the importance of adherence with medications and regular follow-up. The patient was given clear instructions to go to ER or return to medical center if symptoms don't improve, worsen or new problems develop.  The patient verbalized understanding.   Follow-up: Return in about 4 weeks (around 06/05/2022).   Claiborne Rigg, FNP-BC [facility] [place], [place] [phone]   05/08/2022, 9:51 AM

## 2022-05-08 NOTE — Patient Instructions (Addendum)
Call [facility] for your blood pressure machine [phone]  Send me your BP readings over the next 2 weeks on may 17th

## 2022-05-10 LAB — CBC WITH DIFFERENTIAL/PLATELET
Basophils Absolute: 0 10*3/uL (ref 0.0–0.2)
Basos: 1 %
EOS (ABSOLUTE): 0.1 10*3/uL (ref 0.0–0.4)
Eos: 2 %
Hematocrit: 39.6 % (ref 34.0–46.6)
Hemoglobin: 12.6 g/dL (ref 11.1–15.9)
Immature Grans (Abs): 0 10*3/uL (ref 0.0–0.1)
Immature Granulocytes: 0 %
Lymphocytes Absolute: 2 10*3/uL (ref 0.7–3.1)
Lymphs: 31 %
MCH: 26.8 pg (ref 26.6–33.0)
MCHC: 31.8 g/dL (ref 31.5–35.7)
MCV: 84 fL (ref 79–97)
Monocytes Absolute: 0.3 10*3/uL (ref 0.1–0.9)
Monocytes: 5 %
Neutrophils Absolute: 3.9 10*3/uL (ref 1.4–7.0)
Neutrophils: 61 %
Platelets: 142 10*3/uL — ABNORMAL LOW (ref 150–450)
RBC: 4.71 x10E6/uL (ref 3.77–5.28)
RDW: 25.5 % — ABNORMAL HIGH (ref 11.7–15.4)
WBC: 6.4 10*3/uL (ref 3.4–10.8)

## 2022-05-10 LAB — HCV AB W REFLEX TO QUANT PCR: HCV Ab: NONREACTIVE

## 2022-05-10 LAB — CMP14+EGFR
ALT: 13 IU/L (ref 0–32)
AST: 13 IU/L (ref 0–40)
Albumin/Globulin Ratio: 1.3 (ref 1.2–2.2)
Albumin: 4 g/dL (ref 4.0–5.0)
Alkaline Phosphatase: 66 IU/L (ref 44–121)
BUN/Creatinine Ratio: 17 (ref 9–23)
BUN: 18 mg/dL (ref 6–20)
Bilirubin Total: 0.3 mg/dL (ref 0.0–1.2)
CO2: 22 mmol/L (ref 20–29)
Calcium: 8.9 mg/dL (ref 8.7–10.2)
Chloride: 106 mmol/L (ref 96–106)
Creatinine, Ser: 1.08 mg/dL — ABNORMAL HIGH (ref 0.57–1.00)
Globulin, Total: 3 g/dL (ref 1.5–4.5)
Glucose: 96 mg/dL (ref 70–99)
Potassium: 4 mmol/L (ref 3.5–5.2)
Sodium: 142 mmol/L (ref 134–144)
Total Protein: 7 g/dL (ref 6.0–8.5)
eGFR: 71 mL/min/{1.73_m2} (ref >59)

## 2022-05-10 LAB — IRON,TIBC AND FERRITIN PANEL
Ferritin: 46 ng/mL (ref 15–150)
Iron Saturation: 84 % (ref 15–55)
Iron: 308 ug/dL (ref 27–159)
Total Iron Binding Capacity: 368 ug/dL (ref 250–450)
UIBC: 60 ug/dL — ABNORMAL LOW (ref 131–425)

## 2022-05-10 LAB — HCV INTERPRETATION

## 2022-06-08 ENCOUNTER — Ambulatory Visit: Payer: Medicaid Other | Admitting: Nurse Practitioner

## 2022-06-26 ENCOUNTER — Encounter: Payer: Self-pay | Admitting: Advanced Practice Midwife

## 2022-07-07 ENCOUNTER — Ambulatory Visit: Payer: Medicaid Other | Admitting: Advanced Practice Midwife

## 2022-07-07 ENCOUNTER — Other Ambulatory Visit (HOSPITAL_COMMUNITY): Admission: RE | Admit: 2022-07-07 | Payer: Medicaid Other | Source: Ambulatory Visit

## 2022-07-07 ENCOUNTER — Encounter: Payer: Self-pay | Admitting: Advanced Practice Midwife

## 2022-07-07 VITALS — BP 194/131 | HR 74 | Ht 66.0 in | Wt 243.6 lb

## 2022-07-07 DIAGNOSIS — Z113 Encounter for screening for infections with a predominantly sexual mode of transmission: Secondary | ICD-10-CM

## 2022-07-07 DIAGNOSIS — Z01419 Encounter for gynecological examination (general) (routine) without abnormal findings: Secondary | ICD-10-CM | POA: Diagnosis not present

## 2022-07-07 DIAGNOSIS — N939 Abnormal uterine and vaginal bleeding, unspecified: Secondary | ICD-10-CM

## 2022-07-07 DIAGNOSIS — Z124 Encounter for screening for malignant neoplasm of cervix: Secondary | ICD-10-CM | POA: Diagnosis not present

## 2022-07-07 DIAGNOSIS — N898 Other specified noninflammatory disorders of vagina: Secondary | ICD-10-CM

## 2022-07-07 DIAGNOSIS — N921 Excessive and frequent menstruation with irregular cycle: Secondary | ICD-10-CM | POA: Diagnosis not present

## 2022-07-07 DIAGNOSIS — Z3042 Encounter for surveillance of injectable contraceptive: Secondary | ICD-10-CM | POA: Diagnosis not present

## 2022-07-07 DIAGNOSIS — I10 Essential (primary) hypertension: Secondary | ICD-10-CM

## 2022-07-07 MED ORDER — MEDROXYPROGESTERONE ACETATE 150 MG/ML IM SUSP: 150.0000 mg | Freq: Once | INTRAMUSCULAR | Status: DC

## 2022-07-07 MED ORDER — MEDROXYPROGESTERONE ACETATE 150 MG/ML IM SUSP: 150.0000 mg | INTRAMUSCULAR | 3 refills | Status: DC

## 2022-07-07 MED ORDER — MEDROXYPROGESTERONE ACETATE 10 MG PO TABS: 10.0000 mg | ORAL_TABLET | Freq: Every day | ORAL | 1 refills | Status: DC

## 2022-07-07 NOTE — Progress Notes (Signed)
Subjective:     Anita [name] is a 32 y.o. female here at [facility] for a routine exam.  Current complaints: frequent spotting.  Personal health history reviewed.  Do you have a primary care provider? yes Do you feel safe at home? yes  Flowsheet Row Office Visit from 05/08/2022 in [facility]  PHQ-2 Total Score 2      Health Maintenance Due  Topic Date Due   PAP SMEAR-Modifier  Never done    Risk factors for chronic health problems:yes, poorly managed HTN Smoking: smokes every day Alchohol/how much: occasional Pt BMI: Body mass index is 39.32 kg/m.   Gynecologic History No LMP recorded. Contraception: Depo-Provera injections Last Pap: unknown. Results were: n/a Last mammogram: never. Results were: n/a  Obstetric History OB History  Gravida Para Term Preterm AB Living  3 2 2   1 2   SAB IAB Ectopic Multiple Live Births          1    # Outcome Date GA Lbr Len/2nd Weight Sex Delivery Anes PTL Lv  3 Term 10/15/11 [redacted]w[redacted]d 07:11 / 00:05 3375 g M Vag-Spont EPI  LIV  2 AB           1 Term             The following portions of the patient's history were reviewed and updated as appropriate: allergies, current medications, past family history, past medical history, past social history, past surgical history, and problem list.  Review of Systems A comprehensive review of systems was negative.    Objective:  BP (!) 194/131   Pulse 74   Ht 5\' 6"  (1.676 m)   Wt 110.5 kg   BMI 39.32 kg/m   VS reviewed, nursing note reviewed,  Constitutional: well developed, well nourished, no distress HEENT: normocephalic, thyroid without enlargement or mass HEART: RRR, no murmurs rubs/gallops RESP: clear and equal to auscultation bilaterally in all lobes  Breast Exam:  Performed, discussed recommendation to start mammogram between 40-50 yo/ exam performed: right breast normal without mass, skin or nipple changes or axillary nodes, left breast normal  without mass, skin or nipple changes or axillary nodes Abdomen: soft Neuro: alert and oriented x 3 Skin: warm, dry Psych: affect normal Pelvic exam: Performed: Cervix pink, visually closed, without lesion, scant white creamy discharge, vaginal walls and external genitalia normal Bimanual exam: Cervix 0/long/high, firm, anterior, neg CMT, uterus nontender, nonenlarged, adnexa without tenderness, enlargement, or mass    Assessment/Plan:   1. Encounter for annual routine gynecological examination - reviewed history and current medications - Physical exam performed  2. Encounter for screening for cervical cancer  - Cytology - PAP  3. Screening examination for STI  - Hepatitis B surface antigen - Hepatitis C antibody - HIV Antibody (routine testing w rflx) - RPR  4. Vaginal discharge  - Cervicovaginal ancillary only( [place])  5. Vaginal spotting  - Cervicovaginal ancillary only( [place]) - US PELVIC COMPLETE WITH TRANSVAGINAL; Future  6. Breakthrough bleeding on depo provera -ordered Progesterone, 1 pill x 10days, with 1 refill - discussed other birth control options if spotting continues  7. Abnormal uterine bleeding (AUB)  - US PELVIC COMPLETE WITH TRANSVAGINAL; Future  8. Essential hypertension - managed by PCP - discussed importance of taking all medications as prescribed and following up with PCP   Follow Up: - return to clinic in 3 months to reevaluate spotting  Karis Juba, Student-MidWife 12:54 PM  CNM attestation:  I have seen and examined this patient; I agree with above documentation in the midwife student's note.   Anita [name] is a 32 y.o. [id number] in the [facility] office for annual exam and problem visit for breakthrough bleeding on Depo. See problem list below.   Patient Active Problem List   Diagnosis Date Noted   Malignant hypertension 03/19/2022   Elevated troponin 03/19/2022   Cardiomegaly 03/19/2022   Microcytic anemia  03/19/2022   Encounter for elective termination of pregnancy 10/12/2014     ROS, labs, PMH reviewed  PE: BP (!) 194/131   Pulse 74   Ht 5\' 6"  (1.676 m)   Wt 243 lb 9.6 oz (110.5 kg)   BMI 39.32 kg/m  Gen: calm comfortable, well appearing Resp: normal effort, no distress Abd: soft Pelvic exam: wnl    Plan:     1. Encounter for annual routine gynecological examination   2. Encounter for screening for cervical cancer  - Cytology - PAP  3. Screening examination for STI  - Hepatitis B surface antigen - Hepatitis C antibody - HIV Antibody (routine testing w rflx) - RPR  4. Vaginal discharge  - Cervicovaginal ancillary only( [place])  5. Vaginal spotting  - Cervicovaginal ancillary only( [place]) - US PELVIC COMPLETE WITH TRANSVAGINAL; Future  6. Breakthrough bleeding on depo provera --On further review, pt is past 90 day window for Depo and she reports bleeding was improved for at least 2 months after Depo injection.   --Pt opted to resume Depo and follow up in 3  months --Pt to come this week for nurse visit for initiating Depo and Rx sent for future injections.  7. Abnormal uterine bleeding (AUB)  - US PELVIC COMPLETE WITH TRANSVAGINAL; Future  8. Essential hypertension --Pt with severe range BPs today.  D/C summary from recent ED visit reports pt to continue 2 BP medications but pt taking only one. Resume both medications, f/u with PCP as scheduled next month.  Pt to take BPs at home and keep log.  --Warning signs, reasons to seek care reviewed.    Sharen Counter, CNM 7:09 PM

## 2022-07-07 NOTE — Progress Notes (Signed)
Pt reports spotting for 2 months.

## 2022-07-08 ENCOUNTER — Encounter: Payer: Self-pay | Admitting: Advanced Practice Midwife

## 2022-07-08 LAB — CERVICOVAGINAL ANCILLARY ONLY
Chlamydia: NEGATIVE
Comment: NEGATIVE
Comment: NEGATIVE
Comment: NORMAL
Neisseria Gonorrhea: NEGATIVE
Trichomonas: POSITIVE — AB

## 2022-07-10 ENCOUNTER — Other Ambulatory Visit: Payer: Self-pay

## 2022-07-10 LAB — CYTOLOGY - PAP
Comment: NEGATIVE
Diagnosis: NEGATIVE
High risk HPV: NEGATIVE

## 2022-07-10 MED ORDER — METRONIDAZOLE 500 MG PO TABS: 500.0000 mg | ORAL_TABLET | Freq: Two times a day (BID) | ORAL | 0 refills | Status: DC

## 2022-07-10 NOTE — Progress Notes (Signed)
Pt has been notified. Rx sent for Trichomonas treatment

## 2022-07-11 ENCOUNTER — Other Ambulatory Visit: Payer: Self-pay

## 2022-07-11 ENCOUNTER — Encounter (HOSPITAL_COMMUNITY): Payer: Self-pay

## 2022-07-11 ENCOUNTER — Emergency Department (HOSPITAL_COMMUNITY)
Admission: EM | Admit: 2022-07-11 | Discharge: 2022-07-11 | Disposition: A | Discharge status: Home / Self Care | Payer: Medicaid Other | Attending: Emergency Medicine | Admitting: Emergency Medicine

## 2022-07-11 DIAGNOSIS — I10 Essential (primary) hypertension: Secondary | ICD-10-CM | POA: Diagnosis not present

## 2022-07-11 DIAGNOSIS — R519 Headache, unspecified: Secondary | ICD-10-CM | POA: Diagnosis present

## 2022-07-11 LAB — BASIC METABOLIC PANEL
Anion gap: 10 (ref 5–15)
BUN: 19 mg/dL (ref 6–20)
CO2: 22 mmol/L (ref 22–32)
Calcium: 8.5 mg/dL — ABNORMAL LOW (ref 8.9–10.3)
Chloride: 109 mmol/L (ref 98–111)
Creatinine, Ser: 1.19 mg/dL — ABNORMAL HIGH (ref 0.44–1.00)
GFR, Estimated: >60 mL/min (ref >60)
Glucose, Bld: 116 mg/dL — ABNORMAL HIGH (ref 70–99)
Potassium: 3.5 mmol/L (ref 3.5–5.1)
Sodium: 141 mmol/L (ref 135–145)

## 2022-07-11 LAB — CBC WITH DIFFERENTIAL/PLATELET
Abs Immature Granulocytes: 0.04 10*3/uL (ref 0.00–0.07)
Basophils Absolute: 0 10*3/uL (ref 0.0–0.1)
Basophils Relative: 0 %
Eosinophils Absolute: 0.2 10*3/uL (ref 0.0–0.5)
Eosinophils Relative: 2 %
HCT: 41.1 % (ref 36.0–46.0)
Hemoglobin: 13.6 g/dL (ref 12.0–15.0)
Immature Granulocytes: 1 %
Lymphocytes Relative: 27 %
Lymphs Abs: 2.3 10*3/uL (ref 0.7–4.0)
MCH: 30.7 pg (ref 26.0–34.0)
MCHC: 33.1 g/dL (ref 30.0–36.0)
MCV: 92.8 fL (ref 80.0–100.0)
Monocytes Absolute: 0.4 10*3/uL (ref 0.1–1.0)
Monocytes Relative: 5 %
Neutro Abs: 5.6 10*3/uL (ref 1.7–7.7)
Neutrophils Relative %: 65 %
Platelets: 127 10*3/uL — ABNORMAL LOW (ref 150–400)
RBC: 4.43 MIL/uL (ref 3.87–5.11)
RDW: 13.5 % (ref 11.5–15.5)
WBC: 8.6 10*3/uL (ref 4.0–10.5)
nRBC: 0 % (ref 0.0–0.2)

## 2022-07-11 LAB — HCG, SERUM, QUALITATIVE: Preg, Serum: NEGATIVE

## 2022-07-11 MED ORDER — LISINOPRIL 2.5 MG PO TABS
2.5000 mg | ORAL_TABLET | Freq: Once | ORAL | Status: AC
  Administered 2022-07-11: 2.5 mg via ORAL
  Filled 2022-07-11: qty 1

## 2022-07-11 MED ORDER — ISOSORBIDE MONONITRATE ER 60 MG PO TB24
60.0000 mg | ORAL_TABLET | Freq: Once | ORAL | Status: AC
  Administered 2022-07-11: 60 mg via ORAL
  Filled 2022-07-11: qty 1

## 2022-07-11 MED ORDER — KETOROLAC TROMETHAMINE 15 MG/ML IJ SOLN
15.0000 mg | Freq: Once | INTRAMUSCULAR | Status: AC
  Administered 2022-07-11: 15 mg via INTRAVENOUS
  Filled 2022-07-11: qty 1

## 2022-07-11 MED ORDER — METOCLOPRAMIDE HCL 5 MG/ML IJ SOLN
10.0000 mg | Freq: Once | INTRAMUSCULAR | Status: AC
  Administered 2022-07-11: 10 mg via INTRAVENOUS
  Filled 2022-07-11: qty 2

## 2022-07-11 MED ORDER — LISINOPRIL 5 MG PO TABS: 2.5000 mg | ORAL_TABLET | Freq: Every day | ORAL | 0 refills | Status: DC

## 2022-07-11 MED ORDER — ACETAMINOPHEN 500 MG PO TABS
1000.0000 mg | ORAL_TABLET | Freq: Once | ORAL | Status: AC
  Administered 2022-07-11: 1000 mg via ORAL
  Filled 2022-07-11: qty 2

## 2022-07-11 NOTE — Discharge Instructions (Signed)
You were seen in the emergency department for your headache and your high blood pressure.  Your workup showed no signs of stroke or kidney injury from your blood pressure being high.  Your symptoms improved with headache medication and you can continue to take Tylenol and Motrin every 6 hours as needed for headaches.  Your blood pressure was high in the emergency department and you should continue to take your isosorbide as prescribed and you should restart your lisinopril and take this daily.  You should continue to check your blood pressure daily and keep a log of your blood pressures and you can follow-up with your primary doctor to have your blood pressure rechecked.  You should check your blood pressure at the same time every day when you have been resting for at least 15 minutes to get an accurate reading.  You should return to the emergency department for significantly worsening headaches, repetitive vomiting, numbness or weakness on one side of the body compared to the other if you have a seizure or confusion or any other new or concerning symptoms.

## 2022-07-11 NOTE — ED Provider Notes (Signed)
[place] EMERGENCY DEPARTMENT AT [facility] Provider Note   CSN: [id number] Arrival date & time: 07/11/22  1308     History  Chief Complaint  Patient presents with   Headache    Anita [name] is a 32 y.o. female.  Patient is a 32 year old female with a past medical history of hypertension presenting to the emergency department with headache.  Patient states that she has had on and off headaches however over the last 2 days she has had a constant headache.  Patient states that it feels like a pressure type of pain across the front of her forehead that is worse with position changes.  She denies any vision changes, photophobia or phonophobia, numbness or weakness.  She reports nausea but denies any vomiting.  She states that the headache was gradual in onset and is similar to previous headaches except is more severe than usual.  She states that she has been taking Motrin at home.  She states that she did recently have her Imdur increased and states that she was stopped on her lisinopril.  She states that she did take the Imdur today.  The history is provided by the patient.  Headache      Home Medications Prior to Admission medications   Medication Sig Start Date End Date Taking? Authorizing Provider  lisinopril (ZESTRIL) 5 MG tablet Take 0.5 tablets (2.5 mg total) by mouth daily. 07/11/22 08/10/22 Yes Elayne Snare K, DO  Blood Pressure Monitor DEVI Please provide patient with insurance approved blood pressure monitor Patient not taking: Reported on 07/07/2022 05/08/22   Claiborne Rigg, NP  ferrous sulfate 325 (65 FE) MG tablet Take 1 tablet (325 mg total) by mouth 2 (two) times daily with a meal. 03/21/22   Rhetta Mura, MD  ibuprofen (ADVIL) 800 MG tablet Take 1 tablet (800 mg total) by mouth every 8 (eight) hours as needed. Patient not taking: Reported on 07/07/2022 05/08/22   Claiborne Rigg, NP  isosorbide mononitrate (IMDUR) 60 MG 24 hr tablet Take 1 tablet  (60 mg total) by mouth daily. 05/08/22   Claiborne Rigg, NP  lisinopril (ZESTRIL) 2.5 MG tablet Take 1 tablet (2.5 mg total) by mouth daily. 03/21/22   Rhetta Mura, MD  medroxyPROGESTERone (DEPO-PROVERA) 150 MG/ML injection Inject 1 mL (150 mg total) into the muscle every 3 (three) months. 09/25/22   Leftwich-Kirby, Wilmer Floor, CNM  metroNIDAZOLE (FLAGYL) 500 MG tablet Take 1 tablet (500 mg total) by mouth 2 (two) times daily. 07/10/22   Leftwich-Kirby, Wilmer Floor, CNM      Allergies    Patient has no known allergies.    Review of Systems   Review of Systems  Neurological:  Positive for headaches.    Physical Exam Updated Vital Signs BP (!) 210/135   Pulse 71   Temp 98.3 F (36.8 C) (Oral)   Resp 14   LMP 07/08/2022 (Exact Date)   SpO2 100%  Physical Exam Vitals and nursing note reviewed.  Constitutional:      General: She is not in acute distress.    Appearance: She is well-developed.  HENT:     Head: Normocephalic and atraumatic.     Mouth/Throat:     Mouth: Mucous membranes are moist.     Pharynx: Oropharynx is clear.  Eyes:     Extraocular Movements: Extraocular movements intact.  Cardiovascular:     Rate and Rhythm: Normal rate and regular rhythm.     Heart sounds: Normal heart  sounds.  Pulmonary:     Effort: Pulmonary effort is normal.     Breath sounds: Normal breath sounds.  Abdominal:     Palpations: Abdomen is soft.     Tenderness: There is no abdominal tenderness.  Musculoskeletal:        General: Normal range of motion.     Cervical back: Normal range of motion and neck supple. No rigidity.  Neurological:     Mental Status: She is alert and oriented to person, place, and time.     GCS: GCS eye subscore is 4. GCS verbal subscore is 5. GCS motor subscore is 6.     Cranial Nerves: No cranial nerve deficit, dysarthria or facial asymmetry.     Sensory: No sensory deficit.     Motor: No weakness.     Coordination: Coordination normal.  Psychiatric:         Mood and Affect: Mood normal.        Speech: Speech normal.        Behavior: Behavior normal.     ED Results / Procedures / Treatments   Labs (all labs ordered are listed, but only abnormal results are displayed) Labs Reviewed  BASIC METABOLIC PANEL - Abnormal; Notable for the following components:      Result Value   Glucose, Bld 116 (*)    Creatinine, Ser 1.19 (*)    Calcium 8.5 (*)    All other components within normal limits  CBC WITH DIFFERENTIAL/PLATELET - Abnormal; Notable for the following components:   Platelets 127 (*)    All other components within normal limits  HCG, SERUM, QUALITATIVE    EKG EKG Interpretation Date/Time:  Saturday July 11 2022 19:31:54 EDT Ventricular Rate:  80 PR Interval:  174 QRS Duration:  121 QT Interval:  390 QTC Calculation: 450 R Axis:   79  Text Interpretation: Sinus rhythm Probable left atrial enlargement IVCD, consider atypical RBBB Abnormal T, consider ischemia, lateral leads Borderline ST elevation, anterior leads Mildly deeper t-wave inversions inferiorly compared to prior EKG Confirmed by Elayne Snare ([id number]) on 07/11/2022 7:35:06 PM  Radiology No results found.  Procedures Procedures    Medications Ordered in ED Medications  ketorolac (TORADOL) 15 MG/ML injection 15 mg (15 mg Intravenous Given 07/11/22 2039)  metoCLOPramide (REGLAN) injection 10 mg (10 mg Intravenous Given 07/11/22 2039)  acetaminophen (TYLENOL) tablet 1,000 mg (1,000 mg Oral Given 07/11/22 2038)  lisinopril (ZESTRIL) tablet 2.5 mg (2.5 mg Oral Given 07/11/22 2131)  isosorbide mononitrate (IMDUR) 24 hr tablet 60 mg (60 mg Oral Given 07/11/22 2131)    ED Course/ Medical Decision Making/ A&P Clinical Course as of 07/11/22 2209  Sat Jul 11, 2022  2049 Cr approximately at baseline. Will be given home lisinopril. [VK]  2113 Upon reassessment, the patient's headache has resolved.  She remains hypertensive to the 200s over 130s.  The patient will be restarted on her  lisinopril and will be given her home dose of Imdur.  She was recommended to continue blood pressure checks at home and to follow-up with her primary doctor for reassessment.  She was given strict return precautions. [VK]    Clinical Course User Index [VK] Rexford Maus, DO                             Medical Decision Making This patient presents to the ED with chief complaint(s) of headache with pertinent past medical history of hypertension which  further complicates the presenting complaint. The complaint involves an extensive differential diagnosis and also carries with it a high risk of complications and morbidity.    The differential diagnosis includes patient's headache was not sudden onset without focal neurologic deficits or trauma making ICH or mass effect unlikely, she has no signs of CVA or TIA making a hypertensive emergency unlikely, no fevers or meningismus making meningitis unlikely, considering tension headache, migraine headache  Additional history obtained: Additional history obtained from N/A Records reviewed Primary Care Documents  ED Course and Reassessment: On patient's arrival to the emergency department she was hypertensive to the 200s but otherwise hemodynamically stable no acute distress without focal neurologic deficits.  Due to patient's extreme hypertension she will have labs performed to evaluate for kidney dysfunction.  EKG was performed that showed no significant change and has no active chest pain making ACS unlikely.  Patient will be treated with headache cocktail and will have blood pressure rechecked to determine if she needs to have further blood pressure management or changes.  Independent labs interpretation:  The following labs were independently interpreted: at baseline   Independent visualization of imaging: - N/A  Consultation: - Consulted or discussed management/test interpretation w/ external professional: N/A  Consideration for  admission or further workup: Patient has no emergent conditions requiring admission or further work-up at this time and is stable for discharge home with primary care follow-up  Social Determinants of health: N/A    Amount and/or Complexity of Data Reviewed Labs: ordered.  Risk OTC drugs. Prescription drug management.          Final Clinical Impression(s) / ED Diagnoses Final diagnoses:  Acute nonintractable headache, unspecified headache type  Uncontrolled hypertension    Rx / DC Orders ED Discharge Orders          Ordered    lisinopril (ZESTRIL) 5 MG tablet  Daily        07/11/22 2119              Rexford Maus, DO 07/11/22 2209

## 2022-07-11 NOTE — ED Triage Notes (Signed)
Pt states that she has had headache x 2 days with intermittent nausea. Pt has missed her BP meds x 3 days and is hypertensive in triage. Denies CP. Denies dizziness.

## 2022-07-14 ENCOUNTER — Ambulatory Visit (HOSPITAL_COMMUNITY): Admission: RE | Admit: 2022-07-14 | Payer: Medicaid Other | Source: Ambulatory Visit

## 2022-07-14 ENCOUNTER — Ambulatory Visit: Payer: Medicaid Other

## 2022-07-23 ENCOUNTER — Encounter: Payer: Self-pay | Admitting: Nurse Practitioner

## 2022-07-23 ENCOUNTER — Ambulatory Visit: Payer: Medicaid Other | Attending: Critical Care Medicine | Admitting: Critical Care Medicine

## 2022-07-23 ENCOUNTER — Encounter: Payer: Self-pay | Admitting: Critical Care Medicine

## 2022-07-23 VITALS — BP 180/120 | HR 75 | Ht 66.0 in | Wt 249.6 lb

## 2022-07-23 DIAGNOSIS — F17290 Nicotine dependence, other tobacco product, uncomplicated: Secondary | ICD-10-CM | POA: Insufficient documentation

## 2022-07-23 DIAGNOSIS — E010 Iodine-deficiency related diffuse (endemic) goiter: Secondary | ICD-10-CM

## 2022-07-23 DIAGNOSIS — I10 Essential (primary) hypertension: Secondary | ICD-10-CM | POA: Diagnosis not present

## 2022-07-23 DIAGNOSIS — D696 Thrombocytopenia, unspecified: Secondary | ICD-10-CM | POA: Insufficient documentation

## 2022-07-23 DIAGNOSIS — Z13220 Encounter for screening for lipoid disorders: Secondary | ICD-10-CM

## 2022-07-23 DIAGNOSIS — Z720 Tobacco use: Secondary | ICD-10-CM

## 2022-07-23 MED ORDER — CLONIDINE HCL 0.1 MG PO TABS
0.1000 mg | ORAL_TABLET | Freq: Once | ORAL | Status: AC
Start: 2022-07-23 — End: 2022-07-23
  Administered 2022-07-23: 0.1 mg via ORAL

## 2022-07-23 MED ORDER — VALSARTAN 80 MG PO TABS: 80.0000 mg | ORAL_TABLET | Freq: Every day | ORAL | 3 refills | Status: DC

## 2022-07-23 MED ORDER — CARVEDILOL 12.5 MG PO TABS
12.5000 mg | ORAL_TABLET | Freq: Two times a day (BID) | ORAL | 3 refills | Status: DC
Start: 2022-07-23 — End: 2023-06-11

## 2022-07-23 MED ORDER — AMLODIPINE BESYLATE 5 MG PO TABS: 10.0000 mg | ORAL_TABLET | Freq: Every day | ORAL | 2 refills | Status: DC

## 2022-07-23 NOTE — Progress Notes (Signed)
Acute Office Visit  Subjective:     Patient ID: Anita [name], female    DOB: [birth year], 32 y.o.   MRN: [id number]  Chief Complaint  Patient presents with   Hypertension    Patient is unable to past a pre employment screening due to her BP being elevated. Patient also states that the medication IMDUR is causing her crippling migraines.     This is a 32 year old female history of severe hypertension comes in today for follow-up after having been in the emergency room recently with malignant hypertension started on low-dose lisinopril.  Recently seen by primary care to establish in May when isosorbide was prescribed.  She had been on this previously dose was increased to 60 mg a day.  This caused severe hypertension she stopped this medication.  Blood pressure today is exceedingly high at 185/125  Patient was then given clonidine point 1 mg x 3 blood pressure down to 180/120.  Patient said hypertension started during end of her last pregnancy she had thrombocytopenia.     Review of Systems  Constitutional:  Negative for chills, diaphoresis, fever, malaise/fatigue and weight loss.  HENT:  Negative for congestion, hearing loss, nosebleeds, sore throat and tinnitus.   Eyes:  Negative for blurred vision, photophobia and redness.  Respiratory:  Negative for cough, hemoptysis, sputum production, shortness of breath, wheezing and stridor.   Cardiovascular:  Negative for chest pain, palpitations, orthopnea, claudication, leg swelling and PND.  Gastrointestinal:  Negative for abdominal pain, blood in stool, constipation, diarrhea, heartburn, nausea and vomiting.  Genitourinary:  Negative for dysuria, flank pain, frequency, hematuria and urgency.  Musculoskeletal:  Negative for back pain, falls, joint pain, myalgias and neck pain.  Skin:  Negative for itching and rash.  Neurological:  Positive for headaches. Negative for dizziness, tingling, tremors, sensory change, speech change, focal  weakness, seizures, loss of consciousness and weakness.  Endo/Heme/Allergies:  Negative for environmental allergies and polydipsia. Does not bruise/bleed easily.  Psychiatric/Behavioral:  Negative for depression, memory loss, substance abuse and suicidal ideas. The patient is not nervous/anxious and does not have insomnia.         Objective:    BP (!) 180/120   Pulse 75   Ht 5\' 6"  (1.676 m)   Wt 249 lb 9.6 oz (113.2 kg)   LMP 07/08/2022 (Exact Date)   SpO2 99%   BMI 40.29 kg/m    Physical Exam Vitals reviewed.  Constitutional:      Appearance: Normal appearance. She is well-developed. She is obese. She is not diaphoretic.  HENT:     Head: Normocephalic and atraumatic.     Nose: No nasal deformity, septal deviation, mucosal edema or rhinorrhea.     Right Sinus: No maxillary sinus tenderness or frontal sinus tenderness.     Left Sinus: No maxillary sinus tenderness or frontal sinus tenderness.     Mouth/Throat:     Pharynx: No oropharyngeal exudate.  Eyes:     General: No scleral icterus.    Conjunctiva/sclera: Conjunctivae normal.     Pupils: Pupils are equal, round, and reactive to light.  Neck:     Thyroid: No thyromegaly.     Vascular: No carotid bruit or JVD.     Trachea: Trachea normal. No tracheal tenderness or tracheal deviation.     Comments: Thyromegaly no nodules palpated Cardiovascular:     Rate and Rhythm: Normal rate and regular rhythm.     Chest Wall: PMI is not displaced.     Pulses:  Normal pulses. No decreased pulses.     Heart sounds: Normal heart sounds, S1 normal and S2 normal. Heart sounds not distant. No murmur heard.    No systolic murmur is present.     No diastolic murmur is present.     No friction rub. No gallop. No S3 or S4 sounds.  Pulmonary:     Effort: No tachypnea, accessory muscle usage or respiratory distress.     Breath sounds: No stridor. No decreased breath sounds, wheezing, rhonchi or rales.  Chest:     Chest wall: No tenderness.   Abdominal:     General: Bowel sounds are normal. There is no distension.     Palpations: Abdomen is soft. Abdomen is not rigid.     Tenderness: There is no abdominal tenderness. There is no guarding or rebound.  Musculoskeletal:        General: Normal range of motion.     Cervical back: Normal range of motion and neck supple. No edema, erythema or rigidity. No muscular tenderness. Normal range of motion.  Lymphadenopathy:     Head:     Right side of head: No submental or submandibular adenopathy.     Left side of head: No submental or submandibular adenopathy.     Cervical: No cervical adenopathy.  Skin:    General: Skin is warm and dry.     Coloration: Skin is not pale.     Findings: No rash.     Nails: There is no clubbing.  Neurological:     Mental Status: She is alert and oriented to person, place, and time.     Sensory: No sensory deficit.  Psychiatric:        Speech: Speech normal.        Behavior: Behavior normal.   3 doses of clonidine point 1 mg were given blood pressure reduced slightly to 180/120 patient deemed stable without obvious endorgan damage and discharged with medication adjustment  Results for orders placed or performed in visit on 07/23/22  Comprehensive metabolic panel  Result Value Ref Range   Glucose 101 (H) 70 - 99 mg/dL   BUN 16 6 - 20 mg/dL   Creatinine, Ser 6.96 (H) 0.57 - 1.00 mg/dL   eGFR 64 >29 BM/WUX/3.24   BUN/Creatinine Ratio 14 9 - 23   Sodium 140 134 - 144 mmol/L   Potassium 4.3 3.5 - 5.2 mmol/L   Chloride 103 96 - 106 mmol/L   CO2 22 20 - 29 mmol/L   Calcium 9.4 8.7 - 10.2 mg/dL   Total Protein 7.1 6.0 - 8.5 g/dL   Albumin 4.2 4.0 - 5.0 g/dL   Globulin, Total 2.9 1.5 - 4.5 g/dL   Bilirubin Total 0.3 0.0 - 1.2 mg/dL   Alkaline Phosphatase 73 44 - 121 IU/L   AST 20 0 - 40 IU/L   ALT 26 0 - 32 IU/L  CBC with Differential/Platelet  Result Value Ref Range   WBC 6.8 3.4 - 10.8 x10E3/uL   RBC 4.48 3.77 - 5.28 x10E6/uL   Hemoglobin  13.9 11.1 - 15.9 g/dL   Hematocrit 40.1 02.7 - 46.6 %   MCV 94 79 - 97 fL   MCH 31.0 26.6 - 33.0 pg   MCHC 33.2 31.5 - 35.7 g/dL   RDW 25.3 66.4 - 40.3 %   Platelets 173 150 - 450 x10E3/uL   Neutrophils 63 Not Estab. %   Lymphs 30 Not Estab. %   Monocytes 5 Not Estab. %  Eos 2 Not Estab. %   Basos 0 Not Estab. %   Neutrophils Absolute 4.3 1.4 - 7.0 x10E3/uL   Lymphocytes Absolute 2.1 0.7 - 3.1 x10E3/uL   Monocytes Absolute 0.3 0.1 - 0.9 x10E3/uL   EOS (ABSOLUTE) 0.1 0.0 - 0.4 x10E3/uL   Basophils Absolute 0.0 0.0 - 0.2 x10E3/uL   Immature Granulocytes 0 Not Estab. %   Immature Grans (Abs) 0.0 0.0 - 0.1 x10E3/uL  Thyroid Panel With TSH  Result Value Ref Range   TSH 1.750 0.450 - 4.500 uIU/mL   T4, Total 8.4 4.5 - 12.0 ug/dL   T3 Uptake Ratio 26 24 - 39 %   Free Thyroxine Index 2.2 1.2 - 4.9  Lipid panel  Result Value Ref Range   Cholesterol, Total 195 100 - 199 mg/dL   Triglycerides 376 0 - 149 mg/dL   HDL 48 >28 mg/dL   VLDL Cholesterol Cal 21 5 - 40 mg/dL   LDL Chol Calc (NIH) 315 (H) 0 - 99 mg/dL   Chol/HDL Ratio 4.1 0.0 - 4.4 ratio        Assessment & Plan:   Problem List Items Addressed This Visit       Cardiovascular and Mediastinum   Malignant hypertension - Primary    Plan to discontinue lisinopril and isosorbide and begin valsartan 80 mg daily, amlodipine 10 mg daily, carvedilol 12 and half milligram twice daily  Labs were checked renal function was normal  Short-term follow-up with nursing for recheck of vital signs to be made and then follow-up with primary care      Relevant Medications   amLODipine (NORVASC) 5 MG tablet   carvedilol (COREG) 12.5 MG tablet   valsartan (DIOVAN) 80 MG tablet   Other Relevant Orders   Comprehensive metabolic panel (Completed)   CBC with Differential/Platelet (Completed)   Thyroid Panel With TSH (Completed)   Recheck vitals     Endocrine   Thyromegaly    Elevated thyroid function may play a role in the patient's  symptom complex  Thyroid function was checked and was normal  No nodules were palpated will observe for now      Relevant Medications   carvedilol (COREG) 12.5 MG tablet     Other   Tobacco use       Current smoking consumption amount: Patient vapes nicotine trying to reduce  Dicsussion on advise to quit smoking and smoking impacts: Impacts  Patient's willingness to quit: Not certain can quit  Methods to quit smoking discussed: Behavioral modification  Medication management of smoking session drugs discussed: Nicotine replacement Resources provided:  AVS   Setting quit date not established  Follow-up arranged 6 weeks   Time spent counseling the patient:   5 minutes      Other Visit Diagnoses     Lipid screening       Relevant Orders   Lipid panel (Completed)       Meds ordered this encounter  Medications   cloNIDine (CATAPRES) tablet 0.1 mg   amLODipine (NORVASC) 5 MG tablet    Sig: Take 2 tablets (10 mg total) by mouth daily.    Dispense:  60 tablet    Refill:  2   carvedilol (COREG) 12.5 MG tablet    Sig: Take 1 tablet (12.5 mg total) by mouth 2 (two) times daily with a meal.    Dispense:  60 tablet    Refill:  3   valsartan (DIOVAN) 80 MG tablet    Sig: Take  1 tablet (80 mg total) by mouth daily.    Dispense:  60 tablet    Refill:  3   cloNIDine (CATAPRES) tablet 0.1 mg   45 minutes spent extra time needed to reassess after medication given Return in about 6 weeks (around 09/03/2022) for htn, primary care follow up.  Shan Levans, MD

## 2022-07-23 NOTE — Assessment & Plan Note (Signed)
    Current smoking consumption amount: Patient vapes nicotine trying to reduce  Dicsussion on advise to quit smoking and smoking impacts: Impacts  Patient's willingness to quit: Not certain can quit  Methods to quit smoking discussed: Behavioral modification  Medication management of smoking session drugs discussed: Nicotine replacement Resources provided:  AVS   Setting quit date not established  Follow-up arranged 6 weeks   Time spent counseling the patient:   5 minutes

## 2022-07-23 NOTE — Patient Instructions (Addendum)
Clonidine given for high blood pressure  Stop lisinopril and isosorbide  Start : amlodipine and valsartan daily, carvedilol twice daily  Labs today  Return 2 weeks for blood pressure recheck  See PCP 6 weeks  Follow lifestyle approach as below and per handout  The following Lifestyle Medicine recommendations according to American College of Lifestyle Medicine [facility]) were discussed and offered to patient who agrees to start the journey:  A. Whole Foods, Plant-based plate comprising of fruits and vegetables, plant-based proteins, whole-grain carbohydrates was discussed in detail with the patient.   A list for source of those nutrients were also provided to the patient.  Patient will use only water or unsweetened tea for hydration. B.  The need to stay away from risky substances including alcohol, smoking; obtaining 7 to 9 hours of restorative sleep, at least 150 minutes of moderate intensity exercise weekly, the importance of healthy social connections,  and stress reduction techniques were discussed. C.  A full color page of  Calorie density of various food groups per pound showing examples of each food groups was provided to the patient.

## 2022-07-23 NOTE — Assessment & Plan Note (Signed)
Elevated thyroid function may play a role in the patient's symptom complex  Thyroid function was checked and was normal  No nodules were palpated will observe for now

## 2022-07-24 LAB — LIPID PANEL
Chol/HDL Ratio: 4.1 ratio (ref 0.0–4.4)
Cholesterol, Total: 195 mg/dL (ref 100–199)
HDL: 48 mg/dL (ref >39)
LDL Chol Calc (NIH): 126 mg/dL — ABNORMAL HIGH (ref 0–99)
Triglycerides: 119 mg/dL (ref 0–149)
VLDL Cholesterol Cal: 21 mg/dL (ref 5–40)

## 2022-07-24 LAB — COMPREHENSIVE METABOLIC PANEL
ALT: 26 IU/L (ref 0–32)
AST: 20 IU/L (ref 0–40)
Albumin: 4.2 g/dL (ref 4.0–5.0)
Alkaline Phosphatase: 73 IU/L (ref 44–121)
BUN/Creatinine Ratio: 14 (ref 9–23)
BUN: 16 mg/dL (ref 6–20)
Bilirubin Total: 0.3 mg/dL (ref 0.0–1.2)
CO2: 22 mmol/L (ref 20–29)
Calcium: 9.4 mg/dL (ref 8.7–10.2)
Chloride: 103 mmol/L (ref 96–106)
Creatinine, Ser: 1.18 mg/dL — ABNORMAL HIGH (ref 0.57–1.00)
Globulin, Total: 2.9 g/dL (ref 1.5–4.5)
Glucose: 101 mg/dL — ABNORMAL HIGH (ref 70–99)
Potassium: 4.3 mmol/L (ref 3.5–5.2)
Sodium: 140 mmol/L (ref 134–144)
Total Protein: 7.1 g/dL (ref 6.0–8.5)
eGFR: 64 mL/min/{1.73_m2} (ref >59)

## 2022-07-24 LAB — CBC WITH DIFFERENTIAL/PLATELET
Basophils Absolute: 0 10*3/uL (ref 0.0–0.2)
Basos: 0 %
EOS (ABSOLUTE): 0.1 10*3/uL (ref 0.0–0.4)
Eos: 2 %
Hematocrit: 41.9 % (ref 34.0–46.6)
Hemoglobin: 13.9 g/dL (ref 11.1–15.9)
Immature Grans (Abs): 0 10*3/uL (ref 0.0–0.1)
Immature Granulocytes: 0 %
Lymphocytes Absolute: 2.1 10*3/uL (ref 0.7–3.1)
Lymphs: 30 %
MCH: 31 pg (ref 26.6–33.0)
MCHC: 33.2 g/dL (ref 31.5–35.7)
MCV: 94 fL (ref 79–97)
Monocytes Absolute: 0.3 10*3/uL (ref 0.1–0.9)
Monocytes: 5 %
Neutrophils Absolute: 4.3 10*3/uL (ref 1.4–7.0)
Neutrophils: 63 %
Platelets: 173 10*3/uL (ref 150–450)
RBC: 4.48 x10E6/uL (ref 3.77–5.28)
RDW: 13.7 % (ref 11.7–15.4)
WBC: 6.8 10*3/uL (ref 3.4–10.8)

## 2022-07-24 LAB — THYROID PANEL WITH TSH
Free Thyroxine Index: 2.2 (ref 1.2–4.9)
T3 Uptake Ratio: 26 % (ref 24–39)
T4, Total: 8.4 ug/dL (ref 4.5–12.0)
TSH: 1.75 u[IU]/mL (ref 0.450–4.500)

## 2022-07-25 NOTE — Assessment & Plan Note (Signed)
Plan to discontinue lisinopril and isosorbide and begin valsartan 80 mg daily, amlodipine 10 mg daily, carvedilol 12 and half milligram twice daily  Labs were checked renal function was normal  Short-term follow-up with nursing for recheck of vital signs to be made and then follow-up with primary care

## 2022-07-25 NOTE — Addendum Note (Signed)
Addended by: Storm Frisk on: 07/25/2022 06:35 AM   Modules accepted: Level of Service

## 2022-08-07 ENCOUNTER — Ambulatory Visit: Payer: Medicaid Other

## 2022-08-11 ENCOUNTER — Ambulatory Visit: Payer: Medicaid Other

## 2022-08-12 ENCOUNTER — Ambulatory Visit: Payer: Medicaid Other

## 2022-08-18 ENCOUNTER — Ambulatory Visit: Payer: Medicaid Other | Admitting: Nurse Practitioner

## 2022-09-04 ENCOUNTER — Ambulatory Visit: Payer: Medicaid Other | Admitting: Nurse Practitioner

## 2022-10-18 ENCOUNTER — Other Ambulatory Visit: Payer: Self-pay | Admitting: Critical Care Medicine

## 2022-10-19 NOTE — Telephone Encounter (Signed)
Requested Prescriptions  Pending Prescriptions Disp Refills   amLODipine (NORVASC) 5 MG tablet [Pharmacy Med Name: AMLODIPINE BESYLATE 5MG  TABLETS] 180 tablet 0    Sig: TAKE 2 TABLETS(10 MG) BY MOUTH DAILY     Cardiovascular: Calcium Channel Blockers 2 Failed - 10/18/2022  3:44 AM      Failed - Last BP in normal range    BP Readings from Last 1 Encounters:  07/23/22 (!) 180/120         Passed - Last Heart Rate in normal range    Pulse Readings from Last 1 Encounters:  07/23/22 75         Passed - Valid encounter within last 6 months    Recent Outpatient Visits           2 months ago Malignant hypertension   [facility] Storm Frisk, MD   5 months ago Encounter to establish care   [facility] Claiborne Rigg, NP

## 2023-06-08 ENCOUNTER — Emergency Department (HOSPITAL_COMMUNITY)

## 2023-06-08 ENCOUNTER — Inpatient Hospital Stay (HOSPITAL_COMMUNITY)
Admission: EM | Admit: 2023-06-08 | Discharge: 2023-06-12 | DRG: 065 | Disposition: A | Discharge status: Home / Self Care | Attending: Internal Medicine | Admitting: Internal Medicine

## 2023-06-08 ENCOUNTER — Encounter (HOSPITAL_COMMUNITY): Payer: Self-pay

## 2023-06-08 ENCOUNTER — Other Ambulatory Visit: Payer: Self-pay

## 2023-06-08 DIAGNOSIS — R42 Dizziness and giddiness: Secondary | ICD-10-CM | POA: Diagnosis not present

## 2023-06-08 DIAGNOSIS — Z7982 Long term (current) use of aspirin: Secondary | ICD-10-CM | POA: Diagnosis not present

## 2023-06-08 DIAGNOSIS — I3139 Other pericardial effusion (noninflammatory): Secondary | ICD-10-CM | POA: Diagnosis present

## 2023-06-08 DIAGNOSIS — Z6836 Body mass index (BMI) 36.0-36.9, adult: Secondary | ICD-10-CM

## 2023-06-08 DIAGNOSIS — Z7902 Long term (current) use of antithrombotics/antiplatelets: Secondary | ICD-10-CM | POA: Diagnosis not present

## 2023-06-08 DIAGNOSIS — G4733 Obstructive sleep apnea (adult) (pediatric): Secondary | ICD-10-CM | POA: Diagnosis not present

## 2023-06-08 DIAGNOSIS — I639 Cerebral infarction, unspecified: Secondary | ICD-10-CM | POA: Diagnosis not present

## 2023-06-08 DIAGNOSIS — R29898 Other symptoms and signs involving the musculoskeletal system: Secondary | ICD-10-CM | POA: Diagnosis not present

## 2023-06-08 DIAGNOSIS — I959 Hypotension, unspecified: Secondary | ICD-10-CM | POA: Diagnosis not present

## 2023-06-08 DIAGNOSIS — R471 Dysarthria and anarthria: Secondary | ICD-10-CM | POA: Diagnosis not present

## 2023-06-08 DIAGNOSIS — I63511 Cerebral infarction due to unspecified occlusion or stenosis of right middle cerebral artery: Principal | ICD-10-CM | POA: Diagnosis present

## 2023-06-08 DIAGNOSIS — E669 Obesity, unspecified: Secondary | ICD-10-CM | POA: Diagnosis not present

## 2023-06-08 DIAGNOSIS — E66812 Obesity, class 2: Secondary | ICD-10-CM | POA: Diagnosis present

## 2023-06-08 DIAGNOSIS — R202 Paresthesia of skin: Secondary | ICD-10-CM | POA: Diagnosis not present

## 2023-06-08 DIAGNOSIS — R29701 NIHSS score 1: Secondary | ICD-10-CM | POA: Diagnosis not present

## 2023-06-08 DIAGNOSIS — F17200 Nicotine dependence, unspecified, uncomplicated: Secondary | ICD-10-CM | POA: Diagnosis not present

## 2023-06-08 DIAGNOSIS — Z8249 Family history of ischemic heart disease and other diseases of the circulatory system: Secondary | ICD-10-CM | POA: Diagnosis not present

## 2023-06-08 DIAGNOSIS — I10 Essential (primary) hypertension: Secondary | ICD-10-CM | POA: Diagnosis present

## 2023-06-08 DIAGNOSIS — R29810 Facial weakness: Secondary | ICD-10-CM | POA: Diagnosis present

## 2023-06-08 DIAGNOSIS — D509 Iron deficiency anemia, unspecified: Secondary | ICD-10-CM | POA: Diagnosis present

## 2023-06-08 DIAGNOSIS — Z79899 Other long term (current) drug therapy: Secondary | ICD-10-CM | POA: Diagnosis not present

## 2023-06-08 DIAGNOSIS — Z91148 Patient's other noncompliance with medication regimen for other reason: Secondary | ICD-10-CM | POA: Diagnosis not present

## 2023-06-08 DIAGNOSIS — G43109 Migraine with aura, not intractable, without status migrainosus: Secondary | ICD-10-CM

## 2023-06-08 DIAGNOSIS — I161 Hypertensive emergency: Principal | ICD-10-CM | POA: Diagnosis present

## 2023-06-08 DIAGNOSIS — F17290 Nicotine dependence, other tobacco product, uncomplicated: Secondary | ICD-10-CM | POA: Diagnosis present

## 2023-06-08 DIAGNOSIS — G459 Transient cerebral ischemic attack, unspecified: Secondary | ICD-10-CM | POA: Diagnosis not present

## 2023-06-08 DIAGNOSIS — E785 Hyperlipidemia, unspecified: Secondary | ICD-10-CM | POA: Diagnosis present

## 2023-06-08 DIAGNOSIS — R4781 Slurred speech: Secondary | ICD-10-CM | POA: Diagnosis not present

## 2023-06-08 DIAGNOSIS — F17210 Nicotine dependence, cigarettes, uncomplicated: Secondary | ICD-10-CM | POA: Diagnosis not present

## 2023-06-08 DIAGNOSIS — R011 Cardiac murmur, unspecified: Secondary | ICD-10-CM | POA: Diagnosis not present

## 2023-06-08 DIAGNOSIS — Z8673 Personal history of transient ischemic attack (TIA), and cerebral infarction without residual deficits: Secondary | ICD-10-CM | POA: Diagnosis not present

## 2023-06-08 DIAGNOSIS — G8194 Hemiplegia, unspecified affecting left nondominant side: Secondary | ICD-10-CM | POA: Diagnosis not present

## 2023-06-08 LAB — DIFFERENTIAL
Abs Immature Granulocytes: 0.03 10*3/uL (ref 0.00–0.07)
Basophils Absolute: 0 10*3/uL (ref 0.0–0.1)
Basophils Relative: 0 %
Eosinophils Absolute: 0.1 10*3/uL (ref 0.0–0.5)
Eosinophils Relative: 1 %
Immature Granulocytes: 0 %
Lymphocytes Relative: 31 %
Lymphs Abs: 2.3 10*3/uL (ref 0.7–4.0)
Monocytes Absolute: 0.4 10*3/uL (ref 0.1–1.0)
Monocytes Relative: 5 %
Neutro Abs: 4.7 10*3/uL (ref 1.7–7.7)
Neutrophils Relative %: 63 %

## 2023-06-08 LAB — COMPREHENSIVE METABOLIC PANEL WITH GFR
ALT: 21 U/L (ref 0–44)
AST: 50 U/L — ABNORMAL HIGH (ref 15–41)
Albumin: 3.3 g/dL — ABNORMAL LOW (ref 3.5–5.0)
Alkaline Phosphatase: 60 U/L (ref 38–126)
Anion gap: 7 (ref 5–15)
BUN: 13 mg/dL (ref 6–20)
CO2: 23 mmol/L (ref 22–32)
Calcium: 8.8 mg/dL — ABNORMAL LOW (ref 8.9–10.3)
Chloride: 108 mmol/L (ref 98–111)
Creatinine, Ser: 1.09 mg/dL — ABNORMAL HIGH (ref 0.44–1.00)
GFR, Estimated: >60 mL/min (ref >60)
Glucose, Bld: 111 mg/dL — ABNORMAL HIGH (ref 70–99)
Potassium: 3.8 mmol/L (ref 3.5–5.1)
Sodium: 138 mmol/L (ref 135–145)
Total Bilirubin: 0.6 mg/dL (ref 0.0–1.2)
Total Protein: 6.4 g/dL — ABNORMAL LOW (ref 6.5–8.1)

## 2023-06-08 LAB — CBC
HCT: 30.8 % — ABNORMAL LOW (ref 36.0–46.0)
Hemoglobin: 8.6 g/dL — ABNORMAL LOW (ref 12.0–15.0)
MCH: 19.1 pg — ABNORMAL LOW (ref 26.0–34.0)
MCHC: 27.9 g/dL — ABNORMAL LOW (ref 30.0–36.0)
MCV: 68.4 fL — ABNORMAL LOW (ref 80.0–100.0)
Platelets: 155 10*3/uL (ref 150–400)
RBC: 4.5 MIL/uL (ref 3.87–5.11)
RDW: 19.5 % — ABNORMAL HIGH (ref 11.5–15.5)
WBC: 7.5 10*3/uL (ref 4.0–10.5)
nRBC: 0 % (ref 0.0–0.2)

## 2023-06-08 LAB — PROTIME-INR
INR: 1.1 (ref 0.8–1.2)
Prothrombin Time: 14.4 s (ref 11.4–15.2)

## 2023-06-08 LAB — CBG MONITORING, ED: Glucose-Capillary: 123 mg/dL — ABNORMAL HIGH (ref 70–99)

## 2023-06-08 LAB — I-STAT CHEM 8, ED
BUN: 13 mg/dL (ref 6–20)
Calcium, Ion: 1.11 mmol/L — ABNORMAL LOW (ref 1.15–1.40)
Chloride: 104 mmol/L (ref 98–111)
Creatinine, Ser: 1.1 mg/dL — ABNORMAL HIGH (ref 0.44–1.00)
Glucose, Bld: 109 mg/dL — ABNORMAL HIGH (ref 70–99)
HCT: 32 % — ABNORMAL LOW (ref 36.0–46.0)
Hemoglobin: 10.9 g/dL — ABNORMAL LOW (ref 12.0–15.0)
Potassium: 3.7 mmol/L (ref 3.5–5.1)
Sodium: 139 mmol/L (ref 135–145)
TCO2: 21 mmol/L — ABNORMAL LOW (ref 22–32)

## 2023-06-08 LAB — APTT: aPTT: 25 s (ref 24–36)

## 2023-06-08 LAB — ETHANOL: Alcohol, Ethyl (B): <15 mg/dL (ref <15)

## 2023-06-08 MED ORDER — LORAZEPAM 2 MG/ML IJ SOLN
2.0000 mg | Freq: Once | INTRAMUSCULAR | Status: AC
  Administered 2023-06-08: 2 mg via INTRAVENOUS
  Filled 2023-06-08: qty 1

## 2023-06-08 MED ORDER — LABETALOL HCL 5 MG/ML IV SOLN
10.0000 mg | Freq: Once | INTRAVENOUS | Status: AC
  Administered 2023-06-08: 10 mg via INTRAVENOUS
  Filled 2023-06-08: qty 4

## 2023-06-08 MED ORDER — SENNOSIDES-DOCUSATE SODIUM 8.6-50 MG PO TABS: 1.0000 | ORAL_TABLET | Freq: Every evening | ORAL | Status: DC | PRN

## 2023-06-08 MED ORDER — ACETAMINOPHEN 650 MG RE SUPP: 650.0000 mg | RECTAL | Status: DC | PRN

## 2023-06-08 MED ORDER — SODIUM CHLORIDE 0.9% FLUSH
3.0000 mL | Freq: Once | INTRAVENOUS | Status: AC
  Administered 2023-06-08: 3 mL via INTRAVENOUS

## 2023-06-08 MED ORDER — STROKE: EARLY STAGES OF RECOVERY BOOK: Freq: Once | Status: AC

## 2023-06-08 MED ORDER — ACETAMINOPHEN 325 MG PO TABS
650.0000 mg | ORAL_TABLET | ORAL | Status: DC | PRN
  Administered 2023-06-09 – 2023-06-10 (×6): 650 mg via ORAL
  Filled 2023-06-08 (×6): qty 2

## 2023-06-08 MED ORDER — AMLODIPINE BESYLATE 5 MG PO TABS
10.0000 mg | ORAL_TABLET | Freq: Every day | ORAL | Status: DC
  Administered 2023-06-08 – 2023-06-12 (×5): 10 mg via ORAL
  Filled 2023-06-08 (×5): qty 2

## 2023-06-08 MED ORDER — HYDRALAZINE HCL 20 MG/ML IJ SOLN
10.0000 mg | Freq: Four times a day (QID) | INTRAMUSCULAR | Status: DC | PRN
  Administered 2023-06-09 – 2023-06-10 (×2): 10 mg via INTRAVENOUS
  Filled 2023-06-08 (×2): qty 1

## 2023-06-08 MED ORDER — IOHEXOL 350 MG/ML SOLN
75.0000 mL | Freq: Once | INTRAVENOUS | Status: AC | PRN
  Administered 2023-06-08: 75 mL via INTRAVENOUS

## 2023-06-08 MED ORDER — ENOXAPARIN SODIUM 40 MG/0.4ML IJ SOSY
40.0000 mg | PREFILLED_SYRINGE | INTRAMUSCULAR | Status: DC
  Administered 2023-06-09 – 2023-06-11 (×3): 40 mg via SUBCUTANEOUS
  Filled 2023-06-08 (×3): qty 0.4

## 2023-06-08 MED ORDER — ASPIRIN 325 MG PO TABS
325.0000 mg | ORAL_TABLET | Freq: Once | ORAL | Status: AC
  Administered 2023-06-08: 325 mg via ORAL
  Filled 2023-06-08: qty 1

## 2023-06-08 MED ORDER — PROCHLORPERAZINE EDISYLATE 10 MG/2ML IJ SOLN
10.0000 mg | Freq: Once | INTRAMUSCULAR | Status: AC
  Administered 2023-06-08: 10 mg via INTRAVENOUS
  Filled 2023-06-08: qty 2

## 2023-06-08 MED ORDER — ACETAMINOPHEN 160 MG/5ML PO SOLN: 650.0000 mg | ORAL | Status: DC | PRN

## 2023-06-08 MED ORDER — DIPHENHYDRAMINE HCL 50 MG/ML IJ SOLN
25.0000 mg | Freq: Once | INTRAMUSCULAR | Status: AC
  Administered 2023-06-08: 25 mg via INTRAVENOUS
  Filled 2023-06-08: qty 1

## 2023-06-08 MED ORDER — CLOPIDOGREL BISULFATE 300 MG PO TABS
300.0000 mg | ORAL_TABLET | Freq: Once | ORAL | Status: AC
  Administered 2023-06-08: 300 mg via ORAL
  Filled 2023-06-08: qty 1

## 2023-06-08 NOTE — Hospital Course (Addendum)
 Ms. Zaun is a 33 year old female with PMH HTN, anemia, arthritis, gout who presented with left-sided weakness and numbness along with slurred speech and left facial droop. MRI brain showed multiple small acute right MCA infarcts.  A&P:  Acute ischemic right MCA stroke - Patient with a history of uncontrolled hypertension presented with LUE weakness, LUE numbness and left facial droop - Neuro deficits have been slowly resolving since admission - MRI brain shows multiple small acute right MCA territory infarcts - CTA head and neck shows occluded right M3 MCA - Neurology following, appreciate recs - s/p TEE on 6/5; no clot or PFO - resuming BP regimen - Cardiolipin IgM mildly elevated; recommended to be repeated in 2 months per neurology -LDL 98, continue statin.  A1c 4.9% - DAPT for 3 weeks then asa monotherapy per neuro -Mild cognitive impairments appreciated during SLP eval.  Slums score 24/30 - Sleep study positive, patient amenable with CPAP titration; machine arranged for discharge per neurology   Hypertensive emergency-resolved Uncontrolled hypertension - Found to have SBP over 200 on admission in the setting of medication noncompliance - Presentation complicated by acute ischemic stroke likely due to uncontrolled hypertension - Continue amlodipine .  Resumed Coreg  and ARB - increase ARB 6/6 for further control  -Pressure controlled on current regimen at discharge which was continued   Microcytic anemia Hx of iron deficiency anemia - Hgb low at 8.6 on admission, unknown baseline  -Iron studies low.  B12 lower limit of normal, 237 -Started on B12 replacement   Systolic murmur - Patient with II/VI systolic murmur heard best at LUSB - Has a history of syncope with last TTE in March 2024 showing EF 64%, severe LVH and small pericardial effusion. - Underwent TEE.  EF 60 to 65%, no RWMA, moderate to severe LVH.  Small pericardial effusion.  No PFO or clots noted   Class II  obesity Body mass index is 36.72 kg/m.

## 2023-06-08 NOTE — ED Notes (Signed)
 Pt returned from CT

## 2023-06-08 NOTE — ED Notes (Signed)
 Patient transported to CT

## 2023-06-08 NOTE — ED Provider Notes (Signed)
 [place] EMERGENCY DEPARTMENT AT [facility] Provider Note   CSN: [id number] Arrival date & time: 06/08/23  1610  An emergency department physician performed an initial assessment on this suspected stroke patient at 1612.  History  Chief Complaint  Patient presents with   Code Stroke    REILEY BERTAGNOLLI is a 33 y.o. female.  33 year old female with past medical history of hypertension and migraine headaches presenting to the emergency department today with left-sided paresthesias.  She states this started around 1 hour prior to arrival.  She states that she later developed a mild right-sided, dull headache.  States that she has had headaches in the past but never paresthesias.  She denies any focal weakness.  He reports that she ran out of her blood pressure medications and has not been taking this for the past few weeks.        Home Medications Prior to Admission medications   Medication Sig Start Date End Date Taking? Authorizing Provider  amLODipine  (NORVASC ) 5 MG tablet TAKE 2 TABLETS(10 MG) BY MOUTH DAILY Patient not taking: Reported on 06/08/2023 10/19/22   Collins Dean, NP  Blood Pressure Monitor DEVI Please provide patient with insurance approved blood pressure monitor 05/08/22   Collins Dean, NP  carvedilol  (COREG ) 12.5 MG tablet Take 1 tablet (12.5 mg total) by mouth 2 (two) times daily with a meal. Patient not taking: Reported on 06/08/2023 07/23/22   Vernell Goldsmith, MD  ibuprofen  (ADVIL ) 800 MG tablet Take 1 tablet (800 mg total) by mouth every 8 (eight) hours as needed. Patient not taking: Reported on 06/08/2023 05/08/22   Fleming, Zelda W, NP  medroxyPROGESTERone  (DEPO-PROVERA ) 150 MG/ML injection Inject 1 mL (150 mg total) into the muscle every 3 (three) months. Patient not taking: Reported on 06/08/2023 09/25/22   Arlester Bence A, CNM  metroNIDAZOLE  (FLAGYL ) 500 MG tablet Take 1 tablet (500 mg total) by mouth 2 (two) times daily. Patient not taking:  Reported on 06/08/2023 07/10/22   Asher Lawn, CNM  valsartan  (DIOVAN ) 80 MG tablet Take 1 tablet (80 mg total) by mouth daily. Patient not taking: Reported on 06/08/2023 07/23/22   Vernell Goldsmith, MD      Allergies    Patient has no known allergies.    Review of Systems   Review of Systems  Neurological:  Positive for numbness and headaches.  All other systems reviewed and are negative.   Physical Exam Updated Vital Signs BP (!) 181/123   Pulse 85   Temp 98.3 F (36.8 C) (Oral)   Resp (!) 26   Ht 5\' 6"  (1.676 m)   Wt 103.2 kg   SpO2 97%   BMI 36.72 kg/m  Physical Exam Vitals and nursing note reviewed.   Gen: NAD Eyes: PERRL, EOMI HEENT: no oropharyngeal swelling Neck: trachea midline Resp: clear to auscultation bilaterally Card: RRR, no murmurs, rubs, or gallops Abd: nontender, nondistended Extremities: no calf tenderness, no edema Vascular: 2+ radial pulses bilaterally, 2+ DP pulses bilaterally Neuro: NIH stroke scale of 1 for diminished sensation over left upper and lower extremities Skin: no rashes Psyc: acting appropriately   ED Results / Procedures / Treatments   Labs (all labs ordered are listed, but only abnormal results are displayed) Labs Reviewed  CBC - Abnormal; Notable for the following components:      Result Value   Hemoglobin 8.6 (*)    HCT 30.8 (*)    MCV 68.4 (*)    [facility]  19.1 (*)    MCHC 27.9 (*)    RDW 19.5 (*)    All other components within normal limits  COMPREHENSIVE METABOLIC PANEL WITH GFR - Abnormal; Notable for the following components:   Glucose, Bld 111 (*)    Creatinine, Ser 1.09 (*)    Calcium 8.8 (*)    Total Protein 6.4 (*)    Albumin 3.3 (*)    AST 50 (*)    All other components within normal limits  I-STAT CHEM 8, ED - Abnormal; Notable for the following components:   Creatinine, Ser 1.10 (*)    Glucose, Bld 109 (*)    Calcium, Ion 1.11 (*)    TCO2 21 (*)    Hemoglobin 10.9 (*)    HCT 32.0 (*)    All other  components within normal limits  CBG MONITORING, ED - Abnormal; Notable for the following components:   Glucose-Capillary 123 (*)    All other components within normal limits  PROTIME-INR  APTT  DIFFERENTIAL  ETHANOL  HCG, SERUM, QUALITATIVE    EKG None  Radiology MR BRAIN WO CONTRAST Result Date: 06/08/2023 CLINICAL DATA:  Provided history: Neuro deficit, acute, stroke suspected. EXAM: MRI HEAD WITHOUT CONTRAST TECHNIQUE: Multiplanar, multiecho pulse sequences of the brain and surrounding structures were obtained without intravenous contrast. COMPARISON:  Non-contrast head CT performed earlier today 07/05/2023. FINDINGS: Brain: Cerebral volume is normal. Small acute right middle cerebral artery territory cortical infarcts within the insula and at the posterior aspect of the sylvian fissure (for instance as seen on series 5, images 70 and 73). Additional small patchy acute right MCA territory infarcts within the right frontoparietal white matter (series 5, images 76-78). Background multifocal T2 FLAIR hyperintense signal abnormality within the cerebral white matter, mild but unexpected for age. Expanded and partially empty sella turcica. No evidence of an intracranial mass. No chronic intracranial blood products. No extra-axial fluid collection. No midline shift. Vascular: Maintained flow voids within the proximal large arterial vessels. Skull and upper cervical spine: Abnormal T1 hypointense marrow signal within the calvarium and within visible portions of the cervical spine. Sinuses/Orbits: No mass or acute finding within the imaged orbits. No significant paranasal sinus disease. Impression #1 called by telephone at the time of interpretation on 06/08/2023 at 7:26 pm to provider Joellen Tullos , who verbally acknowledged these results. IMPRESSION: 1. 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 2. Background T2 FLAIR hyperintense signal changes within the cerebral white matter,  overall mild but unexpected for age. Findings are nonspecific and differential considerations include age-advanced chronic small vessel ischemic disease, sequelae of chronic migraine headaches, vasculitis, sequelae of a prior infectious/inflammatory process and demyelinating disease, among others. 3. Abnormal T1 hypointense marrow signal within the calvarium and within visible portions of the cervical spine. While this finding can reflect a marrow infiltrative process, the most common causes include chronic anemia, smoking and obesity. Electronically Signed   By: Bascom Lily D.O.   On: 06/08/2023 19:28   CT HEAD CODE STROKE WO CONTRAST Result Date: 06/08/2023 CLINICAL DATA:  Code stroke. Provided history: Neuro deficit, acute, stroke suspected. Left-sided facial droop. Left arm weakness. EXAM: CT HEAD WITHOUT CONTRAST TECHNIQUE: Contiguous axial images were obtained from the base of the skull through the vertex without intravenous contrast. RADIATION DOSE REDUCTION: This exam was performed according to the departmental dose-optimization program which includes automated exposure control, adjustment of the mA and/or kV according to patient size and/or use of iterative reconstruction technique. COMPARISON:  Head CT 03/18/2022. FINDINGS: Brain: Cerebral volume is normal. Prominent perivascular space within the right basal ganglia inferiorly. Expanded and partially empty sella turcica. There is no acute intracranial hemorrhage. No demarcated cortical infarct. No extra-axial fluid collection. No evidence of an intracranial mass. No midline shift. Vascular: No hyperdense vessel. Skull: No calvarial fracture or aggressive osseous lesion. Sinuses/Orbits: No mass or acute finding within the imaged orbits. No significant paranasal sinus disease at the imaged levels. ASPECTS [facility] Stroke Program Early CT Score) - Ganglionic level infarction (caudate, lentiform nuclei, internal capsule, insula, M1-M3 cortex): 7 -  Supraganglionic infarction (M4-M6 cortex): 3 Total score (0-10 with 10 being normal): 10 No evidence of an acute intracranial abnormality. These results were communicated to Dr. Doretta Gant at 4:32 pmon 6/3/2025by text page via the [facility] messaging system. IMPRESSION: No evidence of an acute intracranial abnormality. Electronically Signed   By: Bascom Lily D.O.   On: 06/08/2023 16:32    Procedures Procedures    Medications Ordered in ED Medications  sodium chloride  flush (NS) 0.9 % injection 3 mL (has no administration in time range)  aspirin  tablet 325 mg (has no administration in time range)  clopidogrel (PLAVIX) tablet 300 mg (has no administration in time range)  prochlorperazine (COMPAZINE) injection 10 mg (10 mg Intravenous Given 06/08/23 1639)  diphenhydrAMINE  (BENADRYL ) injection 25 mg (25 mg Intravenous Given 06/08/23 1639)  LORazepam (ATIVAN) injection 2 mg (2 mg Intravenous Given 06/08/23 1703)  labetalol  (NORMODYNE ) injection 10 mg (10 mg Intravenous Given 06/08/23 1640)  labetalol  (NORMODYNE ) injection 10 mg (10 mg Intravenous Given 06/08/23 1736)    ED Course/ Medical Decision Making/ A&P                                 Medical Decision Making 33 year old female with past medical history of migraine headaches and hypertension presenting to the emergency department today with left-sided paresthesias and right-sided mild headache.  The patient was evaluated by our stroke team.  Recommended treatment for likely complex migraine but to obtain MRI to rule out CVA given her elevated blood pressure.  Will give the patient labetalol  to try to bring her blood pressure down less than 220/120.  Will reevaluate for ultimate disposition.  Will give her Compazine and Benadryl  for the headache.  Initially tPA was not administered due to her symptoms not being severe with only the paresthesias.  Her blood pressures remain significantly elevated.  Initial neurology recommendations were for treatment of severe  hypertension.  The patient received 2 doses of labetalol  as her blood pressures were greater than 220/120.  MRI does show a small acute stroke.  At the time that these findings were relayed to me this was 4 and half hours after symptom onset.  Her last known normal was 3 PM.  She is outside the window for thrombolytics.  This is discussed with Dr. Murvin Arthurs.  He recommends permissive hypertension focusing on the diastolic blood pressure and to keep this less than 220.  He is loading the patient with aspirin .  Is ordering a CT angiogram.  A call was placed to hospitalist service for admission.  CRITICAL CARE Performed by: Carin Charleston   Total critical care time: 45 minutes  Critical care time was exclusive of separately billable procedures and treating other patients.  Critical care was necessary to treat or prevent imminent or life-threatening deterioration.  Critical care was time spent personally by me on the  following activities: development of treatment plan with patient and/or surrogate as well as nursing, discussions with consultants, evaluation of patient's response to treatment, examination of patient, obtaining history from patient or surrogate, ordering and performing treatments and interventions, ordering and review of laboratory studies, ordering and review of radiographic studies, pulse oximetry and re-evaluation of patient's condition.   Amount and/or Complexity of Data Reviewed Labs: ordered. Radiology: ordered.  Risk Prescription drug management. Decision regarding hospitalization.           Final Clinical Impression(s) / ED Diagnoses Final diagnoses:  Acute ischemic stroke [facility])    Rx / DC Orders ED Discharge Orders     None         Carin Charleston, MD 06/08/23 1941

## 2023-06-08 NOTE — H&P (Signed)
 History and Physical  Anita [name] EAV:[id number] DOB: 25-Jun-1990 DOA: 06/08/2023  PCP: Collins Dean, NP   Chief Complaint: Slurred speech, left-sided facial droop, left-sided weakness and numbness  HPI: Anita [name] is a 33 y.o. female with medical history significant for hypertension, gout, arthritis and anemia who presented to the ED via EMS for evaluation of left-sided facial droop, slurred speech, left-sided weakness and numbness. Patient states she was at a job meeting around 3 pm when she started having left arm weakness, left arm numbness and left facial droop. They called EMS and on arrival, she was found to have high blood pressure (192/114). Patient states her symptoms resolved prior to arrival to the ED except mild numbness on her left arm. She endorse a right-sided headache but denies any chest pain, vision changes, dizziness, SOB, nausea or vomiting. She has a history of high blood pressure but has not taken any BP meds for almost a year. She also endorse heavy menstrual cycles and ice cravings from low iron.   ED Course: Initial vitals show temp 98.3, RR 18, HR 105, BP 240/161, SpO2 100% on room air.  Initial labs significant for creatinine 1.09, glucose 111, WBC 7.5, Hgb 8.6, platelet 155, normal ethanol levels and INR. CT head with no acute intracranial maladies.  MRI brain shows multiple small acute right middle cerebral artery territory infarcts.  Patient received aspirin  325 mg x 1, headache cocktail and IV labetalol  10 mg x 3. Neurology was consulted for evaluation.  TRH was consulted for admission.  Review of Systems: Please see HPI for pertinent positives and negatives. A complete 10 system review of systems are otherwise negative.  Past Medical History:  Diagnosis Date   Anemia    Arthritis    Gout    Hypertension    Past Surgical History:  Procedure Laterality Date   KNEE SURGERY     LEG SURGERY     Social History:  reports that she has been smoking cigars.  She has never used smokeless tobacco. She reports that she does not drink alcohol and does not use drugs.  No Known Allergies  Family History  Problem Relation Age of Onset   Hypertension Mother    Aneurysm Father    Hypertension Sister    Alcohol abuse Neg Hx    Arthritis Neg Hx    Asthma Neg Hx    Birth defects Neg Hx    Cancer Neg Hx    COPD Neg Hx    Depression Neg Hx    Diabetes Neg Hx    Drug abuse Neg Hx    Early death Neg Hx    Hearing loss Neg Hx    Heart disease Neg Hx    Hyperlipidemia Neg Hx    Kidney disease Neg Hx    Learning disabilities Neg Hx    Mental illness Neg Hx    Mental retardation Neg Hx    Miscarriages / Stillbirths Neg Hx    Stroke Neg Hx    Vision loss Neg Hx      Prior to Admission medications   Medication Sig Start Date End Date Taking? Authorizing Provider  amLODipine  (NORVASC ) 5 MG tablet TAKE 2 TABLETS(10 MG) BY MOUTH DAILY Patient not taking: Reported on 06/08/2023 10/19/22   Collins Dean, NP  Blood Pressure Monitor DEVI Please provide patient with insurance approved blood pressure monitor 05/08/22   Collins Dean, NP  carvedilol  (COREG ) 12.5 MG tablet Take 1 tablet (12.5 mg  total) by mouth 2 (two) times daily with a meal. Patient not taking: Reported on 06/08/2023 07/23/22   Vernell Goldsmith, MD  ibuprofen  (ADVIL ) 800 MG tablet Take 1 tablet (800 mg total) by mouth every 8 (eight) hours as needed. Patient not taking: Reported on 06/08/2023 05/08/22   Fleming, Zelda W, NP  medroxyPROGESTERone  (DEPO-PROVERA ) 150 MG/ML injection Inject 1 mL (150 mg total) into the muscle every 3 (three) months. Patient not taking: Reported on 06/08/2023 09/25/22   Arlester Bence A, CNM  metroNIDAZOLE  (FLAGYL ) 500 MG tablet Take 1 tablet (500 mg total) by mouth 2 (two) times daily. Patient not taking: Reported on 06/08/2023 07/10/22   Asher Lawn, CNM  valsartan  (DIOVAN ) 80 MG tablet Take 1 tablet (80 mg total) by mouth daily. Patient not taking:  Reported on 06/08/2023 07/23/22   Vernell Goldsmith, MD    Physical Exam: BP (!) 193/128   Pulse 87   Temp 98.3 F (36.8 C)   Resp (!) 26   Ht 5\' 6"  (1.676 m)   Wt 103.2 kg   SpO2 97%   BMI 36.72 kg/m  General: Pleasant, well-appearing young woman laying in bed. No acute distress. HEENT: [place]/AT. Anicteric sclera. EOMI. PERRLA.  CV: RRR. II/VI systolic murmur heard best at LUSB. No LE edema Pulmonary: Lungs CTAB. Normal effort. No wheezing or rales. Abdominal: Soft, nontender, nondistended. Normal bowel sounds. Extremities: Palpable radial and DP pulses. Normal ROM. Skin: Warm and dry. No obvious rash or lesions. Neuro: A&Ox3. Moves all extremities. Speech is clear. No facial asymmetry. Normal FTN testing. Strength 5/5 in all extremities. Decreased sensation to left arm. Psych: Normal mood and affect          Labs on Admission:  Basic Metabolic Panel: Recent Labs  Lab 06/08/23 1616 06/08/23 1621  NA 138 139  K 3.8 3.7  CL 108 104  CO2 23  --   GLUCOSE 111* 109*  BUN 13 13  CREATININE 1.09* 1.10*  CALCIUM 8.8*  --    Liver Function Tests: Recent Labs  Lab 06/08/23 1616  AST 50*  ALT 21  ALKPHOS 60  BILITOT 0.6  PROT 6.4*  ALBUMIN 3.3*   No results for input(s): "LIPASE", "AMYLASE" in the last 168 hours. No results for input(s): "AMMONIA" in the last 168 hours. CBC: Recent Labs  Lab 06/08/23 1616 06/08/23 1621  WBC 7.5  --   NEUTROABS 4.7  --   HGB 8.6* 10.9*  HCT 30.8* 32.0*  MCV 68.4*  --   PLT 155  --    Cardiac Enzymes: No results for input(s): "CKTOTAL", "CKMB", "CKMBINDEX", "TROPONINI" in the last 168 hours. BNP (last 3 results) No results for input(s): "BNP" in the last 8760 hours.  ProBNP (last 3 results) No results for input(s): "PROBNP" in the last 8760 hours.  CBG: Recent Labs  Lab 06/08/23 1611  GLUCAP 123*    Radiological Exams on Admission: MR BRAIN WO CONTRAST Result Date: 06/08/2023 CLINICAL DATA:  Provided history: Neuro  deficit, acute, stroke suspected. EXAM: MRI HEAD WITHOUT CONTRAST TECHNIQUE: Multiplanar, multiecho pulse sequences of the brain and surrounding structures were obtained without intravenous contrast. COMPARISON:  Non-contrast head CT performed earlier today 07/05/2023. FINDINGS: Brain: Cerebral volume is normal. Small acute right middle cerebral artery territory cortical infarcts within the insula and at the posterior aspect of the sylvian fissure (for instance as seen on series 5, images 70 and 73). Additional small patchy acute right MCA territory infarcts within the right  frontoparietal white matter (series 5, images 76-78). Background multifocal T2 FLAIR hyperintense signal abnormality within the cerebral white matter, mild but unexpected for age. Expanded and partially empty sella turcica. No evidence of an intracranial mass. No chronic intracranial blood products. No extra-axial fluid collection. No midline shift. Vascular: Maintained flow voids within the proximal large arterial vessels. Skull and upper cervical spine: Abnormal T1 hypointense marrow signal within the calvarium and within visible portions of the cervical spine. Sinuses/Orbits: No mass or acute finding within the imaged orbits. No significant paranasal sinus disease. Impression #1 called by telephone at the time of interpretation on 06/08/2023 at 7:26 pm to provider ANDREW TEE , who verbally acknowledged these results. IMPRESSION: 1. 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 2. Background T2 FLAIR hyperintense signal changes within the cerebral white matter, overall mild but unexpected for age. Findings are nonspecific and differential considerations include age-advanced chronic small vessel ischemic disease, sequelae of chronic migraine headaches, vasculitis, sequelae of a prior infectious/inflammatory process and demyelinating disease, among others. 3. Abnormal T1 hypointense marrow signal within the calvarium and  within visible portions of the cervical spine. While this finding can reflect a marrow infiltrative process, the most common causes include chronic anemia, smoking and obesity. Electronically Signed   By: Bascom Lily D.O.   On: 06/08/2023 19:28   CT HEAD CODE STROKE WO CONTRAST Result Date: 06/08/2023 CLINICAL DATA:  Code stroke. Provided history: Neuro deficit, acute, stroke suspected. Left-sided facial droop. Left arm weakness. EXAM: CT HEAD WITHOUT CONTRAST TECHNIQUE: Contiguous axial images were obtained from the base of the skull through the vertex without intravenous contrast. RADIATION DOSE REDUCTION: This exam was performed according to the departmental dose-optimization program which includes automated exposure control, adjustment of the mA and/or kV according to patient size and/or use of iterative reconstruction technique. COMPARISON:  Head CT 03/18/2022. FINDINGS: Brain: Cerebral volume is normal. Prominent perivascular space within the right basal ganglia inferiorly. Expanded and partially empty sella turcica. There is no acute intracranial hemorrhage. No demarcated cortical infarct. No extra-axial fluid collection. No evidence of an intracranial mass. No midline shift. Vascular: No hyperdense vessel. Skull: No calvarial fracture or aggressive osseous lesion. Sinuses/Orbits: No mass or acute finding within the imaged orbits. No significant paranasal sinus disease at the imaged levels. ASPECTS [facility] Stroke Program Early CT Score) - Ganglionic level infarction (caudate, lentiform nuclei, internal capsule, insula, M1-M3 cortex): 7 - Supraganglionic infarction (M4-M6 cortex): 3 Total score (0-10 with 10 being normal): 10 No evidence of an acute intracranial abnormality. These results were communicated to Dr. Doretta Gant at 4:32 pmon 6/3/2025by text page via the [facility] messaging system. IMPRESSION: No evidence of an acute intracranial abnormality. Electronically Signed   By: Bascom Lily D.O.   On: 06/08/2023  16:32   Assessment/Plan Anita [name] is a 33 y.o. female with medical history significant for hypertension, gout, arthritis and anemia who presented to the ED via EMS for evaluation of left-sided facial droop, slurred speech, left-sided weakness and numbness.   # Acute ischemic right MCA stroke  # Hypertensive emergency  #Microcytic anemia -Hgb low at 8.6 on admission, unknown baseline   #***  #***  #***  #***  DVT prophylaxis: Lovenox     Code Status: Prior  Consults called: Neurology  Family Communication: ***  Severity of Illness: The appropriate patient status for this patient is INPATIENT. Inpatient status is judged to be reasonable and necessary in order to provide the required intensity of service to  ensure the patient's safety. The patient's presenting symptoms, physical exam findings, and initial radiographic and laboratory data in the context of their chronic comorbidities is felt to place them at high risk for further clinical deterioration. Furthermore, it is not anticipated that the patient will be medically stable for discharge from the hospital within 2 midnights of admission.   * I certify that at the point of admission it is my clinical judgment that the patient will require inpatient hospital care spanning beyond 2 midnights from the point of admission due to high intensity of service, high risk for further deterioration and high frequency of surveillance required.*  Level of care: Progressive   This record has been created using Conservation officer, historic buildings. Errors have been sought and corrected, but may not always be located. Such creation errors do not reflect on the standard of care.   Vita Grip, MD 06/08/2023, 9:03 PM [facility] Pager: [phone] Isaiah 41:10   If 7PM-7AM, please contact night-coverage [email or web address] Password [email or web address]

## 2023-06-08 NOTE — ED Triage Notes (Signed)
 Pt BIB [facility] from work with c/o L sided facial droop, L sided weakness and numbness, and slurred speech. Pt reports takes BP meds but has not taken them in a few weeks. Bps per EMS initially in 200s, then lowered to 192/114. Pt LKW 1500, symptoms improved within 50 mins per EMS. All other VSS.

## 2023-06-08 NOTE — Consult Note (Addendum)
 NEUROLOGY CONSULT NOTE   Date of service: June 08, 2023 Patient Name: Anita [name] MRN:  [id number] DOB:  [birth year] Chief Complaint: "CODE STROKE" Requesting Provider: Carin Charleston, MD  History of Present Illness  Anita [name] is a 33 y.o. 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 On EMS in code, patient's facial droop and left arm weakness was improving. BP was elevated as high as 220 with EMS.  On exam at bridge, patient was oriented, VFF, EOMI, no dysarthria or aphasia, no focal deficits. She did still endorse decreased sensation to LUE. She stated that she did not have a headache when this started, but does have a headache now.  CTH negative.   LKW: 1500 Modified rankin score: 0-Completely asymptomatic and back to baseline post- stroke IV Thrombolysis: No, low NIH and improving symptoms EVT: No, no LVO suspected  NIHSS components Score: Comment  1a Level of Conscious 0[x]  1[]  2[]  3[]      1b LOC Questions 0[x]  1[]  2[]       1c LOC Commands 0[x]  1[]  2[]       2 Best Gaze 0[x]  1[]  2[]       3 Visual 0[x]  1[]  2[]  3[]      4 Facial Palsy 0[x]  1[]  2[]  3[]      5a Motor Arm - left 0[x]  1[]  2[]  3[]  4[]  UN[]    5b Motor Arm - Right 0[x]  1[]  2[]  3[]  4[]  UN[]    6a Motor Leg - Left 0[x]  1[]  2[]  3[]  4[]  UN[]    6b Motor Leg - Right 0[x]  1[]  2[]  3[]  4[]  UN[]    7 Limb Ataxia 0[x]  1[]  2[]  UN[]      8 Sensory 0[]  1[x]  2[]  UN[]      9 Best Language 0[x]  1[]  2[]  3[]      10 Dysarthria 0[x]  1[]  2[]  UN[]      11 Extinct. and Inattention 0[x]  1[]  2[]       TOTAL:   1      ROS  Comprehensive ROS performed and pertinent positives documented in HPI   Past History   Past Medical History:  Diagnosis Date   Anemia    Arthritis    Gout    Hypertension     Past Surgical History:  Procedure Laterality Date   KNEE SURGERY     LEG SURGERY      Family History: Family History  Problem Relation  Age of Onset   Hypertension Mother    Aneurysm Father    Hypertension Sister    Alcohol abuse Neg Hx    Arthritis Neg Hx    Asthma Neg Hx    Birth defects Neg Hx    Cancer Neg Hx    COPD Neg Hx    Depression Neg Hx    Diabetes Neg Hx    Drug abuse Neg Hx    Early death Neg Hx    Hearing loss Neg Hx    Heart disease Neg Hx    Hyperlipidemia Neg Hx    Kidney disease Neg Hx    Learning disabilities Neg Hx    Mental illness Neg Hx    Mental retardation Neg Hx    Miscarriages / Stillbirths Neg Hx    Stroke Neg Hx    Vision loss Neg Hx     Social History  reports that she has been smoking cigars. She has never used smokeless tobacco. She reports  that she does not drink alcohol and does not use drugs.  No Known Allergies  Medications   Current Facility-Administered Medications:    diphenhydrAMINE  (BENADRYL ) injection 25 mg, 25 mg, Intravenous, Once, Carin Charleston, MD   LORazepam (ATIVAN) injection 2 mg, 2 mg, Intravenous, Once, Eleni Griffin, MD   medroxyPROGESTERone  (DEPO-PROVERA ) injection 150 mg, 150 mg, Intramuscular, Once, Leftwich-Kirby, Lisa A, CNM   prochlorperazine (COMPAZINE) injection 10 mg, 10 mg, Intravenous, Once, Carin Charleston, MD   sodium chloride  flush (NS) 0.9 % injection 3 mL, 3 mL, Intravenous, Once, Carin Charleston, MD  Current Outpatient Medications:    amLODipine  (NORVASC ) 5 MG tablet, TAKE 2 TABLETS(10 MG) BY MOUTH DAILY, Disp: 180 tablet, Rfl: 0   Blood Pressure Monitor DEVI, Please provide patient with insurance approved blood pressure monitor, Disp: 1 each, Rfl: 0   carvedilol  (COREG ) 12.5 MG tablet, Take 1 tablet (12.5 mg total) by mouth 2 (two) times daily with a meal., Disp: 60 tablet, Rfl: 3   ibuprofen  (ADVIL ) 800 MG tablet, Take 1 tablet (800 mg total) by mouth every 8 (eight) hours as needed., Disp: 60 tablet, Rfl: 3   medroxyPROGESTERone  (DEPO-PROVERA ) 150 MG/ML injection, Inject 1 mL (150 mg total) into the muscle every 3 (three) months.,  Disp: 1 mL, Rfl: 3   metroNIDAZOLE  (FLAGYL ) 500 MG tablet, Take 1 tablet (500 mg total) by mouth 2 (two) times daily., Disp: 14 tablet, Rfl: 0   valsartan  (DIOVAN ) 80 MG tablet, Take 1 tablet (80 mg total) by mouth daily., Disp: 60 tablet, Rfl: 3  Vitals   Vitals:   2023/06/15 1600 06/15/2023 1615 06/15/2023 1629 06-15-23 1631  BP:  (!) 240/161    Pulse:  97    Resp:   18   Temp:   98.3 F (36.8 C)   TempSrc:   Oral   SpO2:  100% 100%   Weight: 103.2 kg     Height:    5\' 6"  (1.676 m)    Body mass index is 36.72 kg/m.  Physical Exam   Constitutional: Appears well-developed and well-nourished.  Cardiovascular: Normal rate and regular rhythm.  Respiratory: Effort normal, non-labored breathing.   Neurologic Examination   Neuro: Mental Status: Patient is awake, alert, oriented to person, place, month, year, and situation. Patient is able to give a clear and coherent history. No signs of dysarthria, aphasia or neglect Cranial Nerves: II: Visual Fields are full. Pupils are equal, round, and reactive to light.   III,IV, VI: EOMI without ptosis or diploplia.  V: Facial sensation is symmetric to light touch VII: Facial movement is symmetric.  VIII: hearing is intact to voice X: Uvula elevates symmetrically XI: Shoulder shrug is symmetric. XII: tongue is midline  Motor: Tone is normal. Bulk is normal. 5/5 strength was present in all four extremities.  Sensory: Sensation is decreased in the LUE Cerebellar: FNF and HKS are intact bilaterally   Labs/Imaging/Neurodiagnostic studies   CBC:  Recent Labs  Lab 15-Jun-2023 1616 June 15, 2023 1621  WBC 7.5  --   NEUTROABS 4.7  --   HGB 8.6* 10.9*  HCT 30.8* 32.0*  MCV 68.4*  --   PLT 155  --    Basic Metabolic Panel:  Lab Results  Component Value Date   NA 139 06-15-23   K 3.7 2023-06-15   CO2 22 07/23/2022   GLUCOSE 109 (H) 06-15-2023   BUN 13 15-Jun-2023   CREATININE 1.10 (H) 2023-06-15   CALCIUM 9.4 07/23/2022   GFRNONAA  >  60 07/11/2022   GFRAA >90 09/25/2012   Lipid Panel:  Lab Results  Component Value Date   LDLCALC 126 (H) 07/23/2022   HgbA1c: No results found for: "HGBA1C" Urine Drug Screen:     Component Value Date/Time   LABOPIA NONE DETECTED 03/19/2022 0108   COCAINSCRNUR NONE DETECTED 03/19/2022 0108   LABBENZ NONE DETECTED 03/19/2022 0108   AMPHETMU NONE DETECTED 03/19/2022 0108   THCU NONE DETECTED 03/19/2022 0108   LABBARB NONE DETECTED 03/19/2022 0108    Alcohol Level No results found for: "ETH" INR  Lab Results  Component Value Date   INR 1.1 06/08/2023   APTT  Lab Results  Component Value Date   APTT 25 06/08/2023   AED levels: No results found for: "PHENYTOIN", "ZONISAMIDE", "LAMOTRIGINE", "LEVETIRACETA"  CT Head without contrast(Personally reviewed): No evidence of an acute intracranial abnormality.  ASPECTS 10  MRI Brain(Personally reviewed): pending   ASSESSMENT   Anita [name] is a 33 y.o. 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  On exam at bridge, patient with decreased sensation to LUE. She stated that she did not have a headache when this started, but does have a headache now.  CTH negative.   Impression: Complicated migraine versus Stroke  RECOMMENDATIONS   - Migraine cocktail - MRI Brain  If negative, cancel code stroke  If positive, can admit for stroke workup  Discussed with EDP, Dr. Charlee Conine   Attending Neurohospitalist Addendum Patient seen and examined with APP/Resident. Agree with the history and physical as documented above. Agree with the plan as documented, which I helped formulate. I have edited the note above to reflect my full findings and recommendations. I have independently reviewed the chart, obtained history, review of systems and examined the patient.I have personally reviewed pertinent  head/neck/spine imaging (CT/MRI). Please feel free to call with any questions.  -- Greg Leaks, MD [facility] [phone]  If 7pm- 7am, please page neurology on call as listed in AMION.    ______________________________________________________________________    Cherl Corner, NP [facility]

## 2023-06-08 NOTE — Code Documentation (Signed)
 Stroke Response Nurse Documentation Code Documentation  Anita [name] is a 33 y.o. female arriving to [facility]  via [facility] on 06/08/2023 with past medical hx of HTN and migraine headaches. On No antithrombotic. Code stroke was activated by EMS.   Patient from work where she was LKW at 1500 and now complaining of slurred speech, left facial droop, left arm weakness/numbness. Per patient she was at work when she started to have left arm weakness/numbness, slurred speech, and left facial droop. All but the left arm weakness/numbness resolved prior to arrival.   Stroke team at the bedside on patient arrival. Labs drawn and patient cleared for CT by Dr. Charlee Conine. Patient to CT with team. NIHSS 1, see documentation for details and code stroke times. Patient with left decreased sensation on exam. The following imaging was completed:  CT Head. Patient is a candidate for IV Thrombolytic due to low NIH per MD. Patient is not a candidate for IR per MD.   Care Plan: VS/NIHSS q49min until out of window at 1930, then q2hr x12hr, then q4hr.   Bedside handoff with ED RN Abraham Hoffmann.    Selestino Dakin  Stroke Response RN

## 2023-06-09 ENCOUNTER — Encounter (HOSPITAL_COMMUNITY)

## 2023-06-09 DIAGNOSIS — I63511 Cerebral infarction due to unspecified occlusion or stenosis of right middle cerebral artery: Secondary | ICD-10-CM | POA: Diagnosis not present

## 2023-06-09 DIAGNOSIS — I10 Essential (primary) hypertension: Secondary | ICD-10-CM | POA: Diagnosis not present

## 2023-06-09 DIAGNOSIS — E669 Obesity, unspecified: Secondary | ICD-10-CM | POA: Insufficient documentation

## 2023-06-09 DIAGNOSIS — I161 Hypertensive emergency: Secondary | ICD-10-CM | POA: Diagnosis not present

## 2023-06-09 LAB — CBC
HCT: 31.8 % — ABNORMAL LOW (ref 36.0–46.0)
Hemoglobin: 8.8 g/dL — ABNORMAL LOW (ref 12.0–15.0)
MCH: 19 pg — ABNORMAL LOW (ref 26.0–34.0)
MCHC: 27.7 g/dL — ABNORMAL LOW (ref 30.0–36.0)
MCV: 68.8 fL — ABNORMAL LOW (ref 80.0–100.0)
Platelets: 165 10*3/uL (ref 150–400)
RBC: 4.62 MIL/uL (ref 3.87–5.11)
RDW: 19.9 % — ABNORMAL HIGH (ref 11.5–15.5)
WBC: 9.8 10*3/uL (ref 4.0–10.5)
nRBC: 0 % (ref 0.0–0.2)

## 2023-06-09 LAB — IRON AND TIBC
Iron: 30 ug/dL (ref 28–170)
Saturation Ratios: 6 % — ABNORMAL LOW (ref 10.4–31.8)
TIBC: 540 ug/dL — ABNORMAL HIGH (ref 250–450)
UIBC: 510 ug/dL

## 2023-06-09 LAB — VITAMIN B12: Vitamin B-12: 237 pg/mL (ref 180–914)

## 2023-06-09 LAB — LIPID PANEL
Cholesterol: 169 mg/dL (ref 0–200)
HDL: 57 mg/dL (ref >40)
LDL Cholesterol: 98 mg/dL (ref 0–99)
Total CHOL/HDL Ratio: 3 ratio
Triglycerides: 72 mg/dL (ref <150)
VLDL: 14 mg/dL (ref 0–40)

## 2023-06-09 LAB — FERRITIN: Ferritin: 12 ng/mL (ref 11–307)

## 2023-06-09 LAB — BASIC METABOLIC PANEL WITH GFR
Anion gap: 13 (ref 5–15)
BUN: 12 mg/dL (ref 6–20)
CO2: 20 mmol/L — ABNORMAL LOW (ref 22–32)
Calcium: 8.9 mg/dL (ref 8.9–10.3)
Chloride: 105 mmol/L (ref 98–111)
Creatinine, Ser: 1.3 mg/dL — ABNORMAL HIGH (ref 0.44–1.00)
GFR, Estimated: 57 mL/min — ABNORMAL LOW (ref >60)
Glucose, Bld: 93 mg/dL (ref 70–99)
Potassium: 3.9 mmol/L (ref 3.5–5.1)
Sodium: 138 mmol/L (ref 135–145)

## 2023-06-09 LAB — HCG, SERUM, QUALITATIVE: Preg, Serum: NEGATIVE

## 2023-06-09 MED ORDER — SODIUM CHLORIDE 0.9% FLUSH: 3.0000 mL | INTRAVENOUS | Status: DC | PRN

## 2023-06-09 MED ORDER — ASPIRIN 81 MG PO TBEC
81.0000 mg | DELAYED_RELEASE_TABLET | Freq: Every day | ORAL | Status: DC
  Administered 2023-06-09 – 2023-06-12 (×4): 81 mg via ORAL
  Filled 2023-06-09 (×4): qty 1

## 2023-06-09 MED ORDER — STROKE: EARLY STAGES OF RECOVERY BOOK
Status: AC
  Filled 2023-06-09: qty 1

## 2023-06-09 MED ORDER — CYANOCOBALAMIN 1000 MCG/ML IJ SOLN
1000.0000 ug | Freq: Every day | INTRAMUSCULAR | Status: DC
  Administered 2023-06-09 – 2023-06-12 (×4): 1000 ug via SUBCUTANEOUS
  Filled 2023-06-09 (×4): qty 1

## 2023-06-09 MED ORDER — ATORVASTATIN CALCIUM 40 MG PO TABS
40.0000 mg | ORAL_TABLET | Freq: Every day | ORAL | Status: DC
  Administered 2023-06-09 – 2023-06-12 (×4): 40 mg via ORAL
  Filled 2023-06-09 (×4): qty 1

## 2023-06-09 MED ORDER — CLOPIDOGREL BISULFATE 75 MG PO TABS
75.0000 mg | ORAL_TABLET | Freq: Every day | ORAL | Status: DC
  Administered 2023-06-09 – 2023-06-12 (×4): 75 mg via ORAL
  Filled 2023-06-09 (×4): qty 1

## 2023-06-09 MED ORDER — SODIUM CHLORIDE 0.9% FLUSH
3.0000 mL | Freq: Two times a day (BID) | INTRAVENOUS | Status: DC
  Administered 2023-06-09: 10 mL via INTRAVENOUS

## 2023-06-09 MED ORDER — PROCHLORPERAZINE EDISYLATE 10 MG/2ML IJ SOLN
10.0000 mg | Freq: Once | INTRAMUSCULAR | Status: AC
  Administered 2023-06-09: 10 mg via INTRAVENOUS
  Filled 2023-06-09: qty 2

## 2023-06-09 MED ORDER — DIPHENHYDRAMINE HCL 50 MG/ML IJ SOLN
25.0000 mg | Freq: Once | INTRAMUSCULAR | Status: AC
  Administered 2023-06-09: 25 mg via INTRAVENOUS
  Filled 2023-06-09: qty 1

## 2023-06-09 NOTE — Plan of Care (Signed)

## 2023-06-09 NOTE — Progress Notes (Addendum)
 STROKE TEAM PROGRESS NOTE   INTERIM HISTORY/SUBJECTIVE She presented with transient left upper extremity weakness numbness and facial droop which appears to have improved.  Blood pressure was significantly elevated en route which has also now improved.  MRI scan of the brain shows a tiny right Perry insular infarct and CT angiogram shows right M3 clot.   Patient denies any prior history of strokes, TIAs, complicated migraine, DVT or pulmonary embolism.  No history of rashes, recurrent miscarriages.  No family history of stroke or heart attacks at a young age.  No history of sickle cell disease  Reports migraines once a year that are improved with ibuprofen , no aura  TEE scheduled for tomorrow at 1230.   OBJECTIVE  CBC    Component Value Date/Time   WBC 7.5 06/08/2023 1616   RBC 4.50 06/08/2023 1616   HGB 10.9 (L) 06/08/2023 1621   HGB 13.9 07/23/2022 1346   HCT 32.0 (L) 06/08/2023 1621   HCT 41.9 07/23/2022 1346   PLT 155 06/08/2023 1616   PLT 173 07/23/2022 1346   MCV 68.4 (L) 06/08/2023 1616   MCV 94 07/23/2022 1346   MCH 19.1 (L) 06/08/2023 1616   MCHC 27.9 (L) 06/08/2023 1616   RDW 19.5 (H) 06/08/2023 1616   RDW 13.7 07/23/2022 1346   LYMPHSABS 2.3 06/08/2023 1616   LYMPHSABS 2.1 07/23/2022 1346   MONOABS 0.4 06/08/2023 1616   EOSABS 0.1 06/08/2023 1616   EOSABS 0.1 07/23/2022 1346   BASOSABS 0.0 06/08/2023 1616   BASOSABS 0.0 07/23/2022 1346    BMET    Component Value Date/Time   NA 138 06/09/2023 0540   NA 140 07/23/2022 1346   K 3.9 06/09/2023 0540   CL 105 06/09/2023 0540   CO2 20 (L) 06/09/2023 0540   GLUCOSE 93 06/09/2023 0540   BUN 12 06/09/2023 0540   BUN 16 07/23/2022 1346   CREATININE 1.30 (H) 06/09/2023 0540   CALCIUM 8.9 06/09/2023 0540   EGFR 64 07/23/2022 1346   GFRNONAA 57 (L) 06/09/2023 0540    IMAGING past 24 hours CT ANGIO HEAD NECK W WO CM Addendum Date: 06/08/2023 ADDENDUM REPORT: 06/08/2023 23:00 ADDENDUM: Imaging results were  communicated on 06/08/2023 at 10:48 pm to provider Dr. Murvin Arthurs via telephone. Electronically Signed   By: Stevenson Elbe M.D.   On: 06/08/2023 23:00   Result Date: 06/08/2023 CLINICAL DATA:  Stroke/TIA, determine embolic source EXAM: CT ANGIOGRAPHY HEAD AND NECK WITH AND WITHOUT CONTRAST TECHNIQUE: Multidetector CT imaging of the head and neck was performed using the standard protocol during bolus administration of intravenous contrast. Multiplanar CT image reconstructions and MIPs were obtained to evaluate the vascular anatomy. Carotid stenosis measurements (when applicable) are obtained utilizing NASCET criteria, using the distal internal carotid diameter as the denominator. RADIATION DOSE REDUCTION: This exam was performed according to the departmental dose-optimization program which includes automated exposure control, adjustment of the mA and/or kV according to patient size and/or use of iterative reconstruction technique. CONTRAST:  75mL OMNIPAQUE  IOHEXOL  350 MG/ML SOLN COMPARISON:  Same day CT head and MRI head. FINDINGS: CTA NECK FINDINGS Aortic arch: Great vessel origins are patent without significant stenosis. Right carotid system: No evidence of dissection, stenosis (50% or greater), or occlusion. Left carotid system: No evidence of dissection, stenosis (50% or greater), or occlusion. Vertebral arteries: Codominant. No evidence of dissection, stenosis (50% or greater), or occlusion. Skeleton: No acute abnormality on limited assessment. Other neck: No acute abnormality on limited assessment. Upper chest: Visualized lung apices  are clear. Review of the MIP images confirms the above findings CTA HEAD FINDINGS Anterior circulation: Bilateral intracranial and ACAs are patent without proximal hemodynamically significant stenosis. Left MCA is patent without proximal hemodynamically significant stenosis. Occluded right M3 MCA vessel (see series 5, image 92 and series 8, images 130 through 140). Posterior  circulation: Bilateral intradural vertebral arteries, basilar artery and bilateral posterior is are patent without proximal hemodynamically significant stenosis. Bilateral fetal type PCAs with small vertebrobasilar system, anatomic variant. Venous sinuses: No evidence of dural venous sinus thrombosis. Review of the MIP images confirms the above findings IMPRESSION: 1. Occluded right M3 MCA, as described above. 2. Otherwise, no large vessel occlusion or proximal hemodynamically significant stenosis. Electronically Signed: By: Stevenson Elbe M.D. On: 06/08/2023 22:44   MR BRAIN WO CONTRAST Result Date: 06/08/2023 CLINICAL DATA:  Provided history: Neuro deficit, acute, stroke suspected. EXAM: MRI HEAD WITHOUT CONTRAST TECHNIQUE: Multiplanar, multiecho pulse sequences of the brain and surrounding structures were obtained without intravenous contrast. COMPARISON:  Non-contrast head CT performed earlier today 07/05/2023. FINDINGS: Brain: Cerebral volume is normal. Small acute right middle cerebral artery territory cortical infarcts within the insula and at the posterior aspect of the sylvian fissure (for instance as seen on series 5, images 70 and 73). Additional small patchy acute right MCA territory infarcts within the right frontoparietal white matter (series 5, images 76-78). Background multifocal T2 FLAIR hyperintense signal abnormality within the cerebral white matter, mild but unexpected for age. Expanded and partially empty sella turcica. No evidence of an intracranial mass. No chronic intracranial blood products. No extra-axial fluid collection. No midline shift. Vascular: Maintained flow voids within the proximal large arterial vessels. Skull and upper cervical spine: Abnormal T1 hypointense marrow signal within the calvarium and within visible portions of the cervical spine. Sinuses/Orbits: No mass or acute finding within the imaged orbits. No significant paranasal sinus disease. Impression #1 called by  telephone at the time of interpretation on 06/08/2023 at 7:26 pm to provider ANDREW TEE , who verbally acknowledged these results. IMPRESSION: 1. 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 2. Background T2 FLAIR hyperintense signal changes within the cerebral white matter, overall mild but unexpected for age. Findings are nonspecific and differential considerations include age-advanced chronic small vessel ischemic disease, sequelae of chronic migraine headaches, vasculitis, sequelae of a prior infectious/inflammatory process and demyelinating disease, among others. 3. Abnormal T1 hypointense marrow signal within the calvarium and within visible portions of the cervical spine. While this finding can reflect a marrow infiltrative process, the most common causes include chronic anemia, smoking and obesity. Electronically Signed   By: Bascom Lily D.O.   On: 06/08/2023 19:28   CT HEAD CODE STROKE WO CONTRAST Result Date: 06/08/2023 CLINICAL DATA:  Code stroke. Provided history: Neuro deficit, acute, stroke suspected. Left-sided facial droop. Left arm weakness. EXAM: CT HEAD WITHOUT CONTRAST TECHNIQUE: Contiguous axial images were obtained from the base of the skull through the vertex without intravenous contrast. RADIATION DOSE REDUCTION: This exam was performed according to the departmental dose-optimization program which includes automated exposure control, adjustment of the mA and/or kV according to patient size and/or use of iterative reconstruction technique. COMPARISON:  Head CT 03/18/2022. FINDINGS: Brain: Cerebral volume is normal. Prominent perivascular space within the right basal ganglia inferiorly. Expanded and partially empty sella turcica. There is no acute intracranial hemorrhage. No demarcated cortical infarct. No extra-axial fluid collection. No evidence of an intracranial mass. No midline shift. Vascular: No hyperdense vessel. Skull: No calvarial  fracture or aggressive  osseous lesion. Sinuses/Orbits: No mass or acute finding within the imaged orbits. No significant paranasal sinus disease at the imaged levels. ASPECTS [facility] Stroke Program Early CT Score) - Ganglionic level infarction (caudate, lentiform nuclei, internal capsule, insula, M1-M3 cortex): 7 - Supraganglionic infarction (M4-M6 cortex): 3 Total score (0-10 with 10 being normal): 10 No evidence of an acute intracranial abnormality. These results were communicated to Dr. Doretta Gant at 4:32 pmon 6/3/2025by text page via the [facility] messaging system. IMPRESSION: No evidence of an acute intracranial abnormality. Electronically Signed   By: Bascom Lily D.O.   On: 06/08/2023 16:32    Vitals:   06/09/23 0416 06/09/23 0546 06/09/23 0600 06/09/23 0700  BP: (!) 189/129 (!) 174/124 (!) 170/125 (!) 173/120  Pulse:  88 92 94  Resp:  15 (!) 37 19  Temp:  98.1 F (36.7 C)    TempSrc:  Oral    SpO2:  100% 99% 97%  Weight:      Height:         PHYSICAL EXAM General:  Alert, well-nourished, well-developed patient in no acute distress Psych:  Mood and affect appropriate for situation CV: Regular rate and rhythm on monitor Respiratory:  Regular, unlabored respirations on room air GI: Abdomen soft and nontender   NEURO:  Mental Status: AA&Ox3, patient is able to give clear and coherent history Speech/Language: speech is without dysarthria or aphasia.  Naming, repetition, fluency, and comprehension intact.  Cranial Nerves:  II: PERRL. Visual fields full.  III, IV, VI: EOMI. Eyelids elevate symmetrically.  V: Sensation is intact to light touch and symmetrical to face.  VII: Face is symmetrical resting and smiling VIII: hearing intact to voice. IX, X: Palate elevates symmetrically. Phonation is normal.  BJ:[id number] shrug 5/5. XII: tongue is midline without fasciculations. Motor: 5/5 strength to all muscle groups tested.  Tone: is normal and bulk is normal Sensation-diminished left upper extremity sensation  to light touch bilaterally. Extinction absent to light touch to DSS.   Coordination: FTN intact bilaterally, HKS: no ataxia in BLE.No drift.  Gait- deferred  Most Recent NIH 1 Premorbid modified Rankin 0  ASSESSMENT/PLAN  Ms. ADWOA AXE is a 33 y.o. 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 ASPECTS 10.    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 Therapy recommendations:  Pending Disposition:  pending   Hypertension Home meds:  Amlodipine , valsartan , coreg  PRN hydralazine   Blood Pressure Goal: BP less than 220/110   Hyperlipidemia LDL 98, goal < 70 Add atorvastatin  Continue statin at discharge  Tobacco Abuse Patient smokes cigars       Ready to quit? Yes Nicotine replacement therapy provided  Other Stroke Risk Factors Obesity, Body mass index is 36.72 kg/m., BMI >/= 30 associated with increased stroke risk, recommend weight loss, diet and  exercise as appropriate   Other Active Problems Hormonal birth control use No longer on depo-provera    Hospital day # 1  Patient seen and examined by NP/APP with MD. MD to update note as needed.   Imogene Mana, DNP, FNP-BC [facility] Pager: [phone]  I have personally obtained history,examined this patient, reviewed notes, independently viewed imaging studies, participated in medical decision making  and plan of care.ROS completed by me personally and pertinent positives fully documented  I have made any additions or clarifications directly to the above note. Agree with note above.  Patient presented with transient left upper extremity weakness numbness and facial droop due to embolic right MCA branch infarcts of cryptogenic etiology.  Recommend continue ongoing stroke workup.  Check lower extremity venous Dopplers for DVT, TCD bubble study for PFO, ANA panel, anticardiolipin antibodies, sickle cell screen and TEE.  30-day heart monitor for A-fib.  Aspirin  and Plavix for 3 weeks followed by aspirin  alone and aggressive risk factor modification.  Patient also appear to be at risk for obstructive sleep apnea and may benefit with consideration for participation in the sleep smart stroke prevention study.  She was given written information to evaluate and decide.  It was made clear study participation is voluntary and patient can Morella withdraw from the study at any point in the future if she is not satisfied.  She will get the same excellent medical care irrespective of whether she participates in the study or not. Greater than 50% time during this 50-minute visit was spent in counseling and coordination of care and discussion with patient and care team and answering questions. Ardella Beaver, MD Medical Director [facility] Stroke Center Pager: [phone] 06/09/2023 3:10 PM   To contact Stroke Continuity provider, please refer to [email or web address]. After hours, contact General Neurology

## 2023-06-09 NOTE — Progress Notes (Signed)
 Progress Note    Anita [name]   ZOX:[id number]  DOB: 06-23-1990  DOA: 06/08/2023     1 PCP: Collins Dean, NP  Initial CC: Left-sided numbness and weakness, slurred speech, left facial droop  Hospital Course: Ms. Anita [name] is a 33 year old female with PMH HTN, anemia, arthritis, gout who presented with left-sided weakness and numbness along with slurred speech and left facial droop. MRI brain showed multiple small acute right MCA infarcts.  A&P:  Acute ischemic right MCA stroke - Patient with a history of uncontrolled hypertension presented with LUE weakness, LUE numbness and left facial droop - Neuro deficits have been slowly resolving since admission - MRI brain shows multiple small acute right MCA territory infarcts - CTA head and neck shows occluded right M3 MCA - Neurology following, appreciate recs - Follow-up echocardiogram - Slowly bringing blood pressure down after permissive hypertension -LDL 98, continue statin.  Follow-up A1c   Hypertensive emergency Uncontrolled hypertension - Found to have SBP over 200 on admission in the setting of medication noncompliance - Presentation complicated by acute ischemic stroke likely due to uncontrolled hypertension - Permissive hypertension as above - Start amlodipine  10 mg daily and add additional agents slowly - IV hydralazine  10 mg PRN for SBP > 220 or DBP >120   Microcytic anemia Hx of iron deficiency anemia - Hgb low at 8.6 on admission, unknown baseline  -Iron studies low.  B12 lower limit of normal, 237 -Start on B12 replacement - Trend CBC   Systolic murmur - Patient with II/VI systolic murmur heard best at LUSB - Has a history of syncope with last TTE in March 2024 showing EF 64%, severe LVH and small pericardial effusion. - Follow-up repeat echocardiogram   Class II obesity Body mass index is 36.72 kg/m.  Interval History:  Resting in bed when seen in the ER this morning.  States that her symptoms have  slowly improved since admission.  She is no longer complaining of any further weakness, numbness.   Old records reviewed in assessment of this patient  Antimicrobials:   DVT prophylaxis:  enoxaparin (LOVENOX) injection 40 mg Start: 06/08/23 2145   Code Status:   Code Status: Full Code  Mobility Assessment (Last 72 Hours)     Mobility Assessment     Row Name 06/09/23 1100 06/09/23 0800         What is the highest level of mobility based on the progressive mobility assessment? Level 5 (Walks with assist in room/hall) - Balance while stepping forward/back and can walk in room with assist - Complete Level 4 (Walks with assist in room) - Balance while marching in place and cannot step forward and back - Complete               Barriers to discharge: None Disposition Plan: Home HH orders placed: Home health PT and outpatient OT Status is: Inpatient  Objective: Blood pressure (!) 146/94, pulse 91, temperature 98.2 F (36.8 C), resp. rate (!) 22, height 5\' 6"  (1.676 m), weight 103.2 kg, SpO2 99%.  Examination:  Physical Exam Constitutional:      Appearance: Normal appearance.  HENT:     Head: Normocephalic and atraumatic.     Mouth/Throat:     Mouth: Mucous membranes are moist.  Eyes:     Extraocular Movements: Extraocular movements intact.  Cardiovascular:     Rate and Rhythm: Normal rate and regular rhythm.  Pulmonary:     Effort: Pulmonary effort is normal. No respiratory distress.  Breath sounds: Normal breath sounds. No wheezing.  Abdominal:     General: Bowel sounds are normal. There is no distension.     Palpations: Abdomen is soft.     Tenderness: There is no abdominal tenderness.  Musculoskeletal:        General: Normal range of motion.     Cervical back: Normal range of motion and neck supple.  Skin:    General: Skin is warm and dry.  Neurological:     General: No focal deficit present.     Mental Status: She is alert.  Psychiatric:        Mood  and Affect: Mood normal.      Consultants:  Neurology  Procedures:    Data Reviewed: Results for orders placed or performed during the hospital encounter of 06/08/23 (from the past 24 hours)  CBG monitoring, ED     Status: Abnormal   Collection Time: 06/08/23  4:11 PM  Result Value Ref Range   Glucose-Capillary 123 (H) 70 - 99 mg/dL  Protime-INR     Status: None   Collection Time: 06/08/23  4:16 PM  Result Value Ref Range   Prothrombin Time 14.4 11.4 - 15.2 seconds   INR 1.1 0.8 - 1.2  APTT     Status: None   Collection Time: 06/08/23  4:16 PM  Result Value Ref Range   aPTT 25 24 - 36 seconds  CBC     Status: Abnormal   Collection Time: 06/08/23  4:16 PM  Result Value Ref Range   WBC 7.5 4.0 - 10.5 K/uL   RBC 4.50 3.87 - 5.11 MIL/uL   Hemoglobin 8.6 (L) 12.0 - 15.0 g/dL   HCT 16.1 (L) 09.6 - 04.5 %   MCV 68.4 (L) 80.0 - 100.0 fL   MCH 19.1 (L) 26.0 - 34.0 pg   MCHC 27.9 (L) 30.0 - 36.0 g/dL   RDW 40.9 (H) 81.1 - 91.4 %   Platelets 155 150 - 400 K/uL   nRBC 0.0 0.0 - 0.2 %  Differential     Status: None   Collection Time: 06/08/23  4:16 PM  Result Value Ref Range   Neutrophils Relative % 63 %   Neutro Abs 4.7 1.7 - 7.7 K/uL   Lymphocytes Relative 31 %   Lymphs Abs 2.3 0.7 - 4.0 K/uL   Monocytes Relative 5 %   Monocytes Absolute 0.4 0.1 - 1.0 K/uL   Eosinophils Relative 1 %   Eosinophils Absolute 0.1 0.0 - 0.5 K/uL   Basophils Relative 0 %   Basophils Absolute 0.0 0.0 - 0.1 K/uL   Immature Granulocytes 0 %   Abs Immature Granulocytes 0.03 0.00 - 0.07 K/uL  Comprehensive metabolic panel     Status: Abnormal   Collection Time: 06/08/23  4:16 PM  Result Value Ref Range   Sodium 138 135 - 145 mmol/L   Potassium 3.8 3.5 - 5.1 mmol/L   Chloride 108 98 - 111 mmol/L   CO2 23 22 - 32 mmol/L   Glucose, Bld 111 (H) 70 - 99 mg/dL   BUN 13 6 - 20 mg/dL   Creatinine, Ser 7.82 (H) 0.44 - 1.00 mg/dL   Calcium 8.8 (L) 8.9 - 10.3 mg/dL   Total Protein 6.4 (L) 6.5 - 8.1  g/dL   Albumin 3.3 (L) 3.5 - 5.0 g/dL   AST 50 (H) 15 - 41 U/L   ALT 21 0 - 44 U/L   Alkaline Phosphatase 60 38 - 126  U/L   Total Bilirubin 0.6 0.0 - 1.2 mg/dL   GFR, Estimated >16 >10 mL/min   Anion gap 7 5 - 15  Ethanol     Status: None   Collection Time: 06/08/23  4:16 PM  Result Value Ref Range   Alcohol, Ethyl (B) <15 <15 mg/dL  I-stat chem 8, ED     Status: Abnormal   Collection Time: 06/08/23  4:21 PM  Result Value Ref Range   Sodium 139 135 - 145 mmol/L   Potassium 3.7 3.5 - 5.1 mmol/L   Chloride 104 98 - 111 mmol/L   BUN 13 6 - 20 mg/dL   Creatinine, Ser 9.60 (H) 0.44 - 1.00 mg/dL   Glucose, Bld 454 (H) 70 - 99 mg/dL   Calcium, Ion 0.98 (L) 1.15 - 1.40 mmol/L   TCO2 21 (L) 22 - 32 mmol/L   Hemoglobin 10.9 (L) 12.0 - 15.0 g/dL   HCT 11.9 (L) 14.7 - 82.9 %  hCG, serum, qualitative     Status: None   Collection Time: 06/09/23  5:40 AM  Result Value Ref Range   Preg, Serum NEGATIVE NEGATIVE  Lipid panel     Status: None   Collection Time: 06/09/23  5:40 AM  Result Value Ref Range   Cholesterol 169 0 - 200 mg/dL   Triglycerides 72 <562 mg/dL   HDL 57 >13 mg/dL   Total CHOL/HDL Ratio 3.0 RATIO   VLDL 14 0 - 40 mg/dL   LDL Cholesterol 98 0 - 99 mg/dL  Basic metabolic panel     Status: Abnormal   Collection Time: 06/09/23  5:40 AM  Result Value Ref Range   Sodium 138 135 - 145 mmol/L   Potassium 3.9 3.5 - 5.1 mmol/L   Chloride 105 98 - 111 mmol/L   CO2 20 (L) 22 - 32 mmol/L   Glucose, Bld 93 70 - 99 mg/dL   BUN 12 6 - 20 mg/dL   Creatinine, Ser 0.86 (H) 0.44 - 1.00 mg/dL   Calcium 8.9 8.9 - 57.8 mg/dL   GFR, Estimated 57 (L) >60 mL/min   Anion gap 13 5 - 15  Iron and TIBC     Status: Abnormal   Collection Time: 06/09/23  5:40 AM  Result Value Ref Range   Iron 30 28 - 170 ug/dL   TIBC 469 (H) 629 - 528 ug/dL   Saturation Ratios 6 (L) 10.4 - 31.8 %   UIBC 510 ug/dL  Ferritin     Status: None   Collection Time: 06/09/23  5:40 AM  Result Value Ref Range    Ferritin 12 11 - 307 ng/mL  Vitamin B12     Status: None   Collection Time: 06/09/23  5:40 AM  Result Value Ref Range   Vitamin B-12 237 180 - 914 pg/mL  CBC     Status: Abnormal   Collection Time: 06/09/23  8:07 AM  Result Value Ref Range   WBC 9.8 4.0 - 10.5 K/uL   RBC 4.62 3.87 - 5.11 MIL/uL   Hemoglobin 8.8 (L) 12.0 - 15.0 g/dL   HCT 41.3 (L) 24.4 - 01.0 %   MCV 68.8 (L) 80.0 - 100.0 fL   MCH 19.0 (L) 26.0 - 34.0 pg   MCHC 27.7 (L) 30.0 - 36.0 g/dL   RDW 27.2 (H) 53.6 - 64.4 %   Platelets 165 150 - 400 K/uL   nRBC 0.0 0.0 - 0.2 %    I have reviewed pertinent nursing  notes, vitals, labs, and images as necessary. I have ordered labwork to follow up on as indicated.  I have reviewed the last notes from staff over past 24 hours. I have discussed patient's care plan and test results with nursing staff, CM/SW, and other staff as appropriate.  Time spent: Greater than 50% of the 55 minute visit was spent in counseling/coordination of care for the patient as laid out in the A&P.   LOS: 1 day   Faith Homes, MD [facility] 06/09/2023, 12:24 PM

## 2023-06-09 NOTE — Progress Notes (Signed)
 Physical Therapy Evaluation Patient Details Name: Anita [name] MRN: [id number] DOB: [birth year] Today's Date: 06/09/2023  History of Present Illness  Anita [name] is a 33 y.o. female who presented to the ED 06/08/23 via EMS for evaluation of left-sided facial droop, slurred speech, left-sided weakness and numbness, and right-sided headache. Found to have high BP 192/114. She has not taken any BP meds for almost a year. MRI brain showed multiple small acute right middle cerebral artery territory infarcts. CTA head and neck showed occluded right M3 MCA. PMH significant fro HTN, gout, arthritis, and anemia.   Clinical Impression  Pt admitted with above diagnosis. PTA, pt was independent with functional mobility, ADLs, IADLs, driving, and working as a Location manager. She lives with her boyfriend and son in an apartment where the bedroom and bathroom are located upstairs. Pt currently with functional limitations due to the deficits listed below (see PT Problem List). She completed bed mobility and transfers with supervision and gait with CGA. Pt ambulated ~42ft within the room, without an AD, and CGA for safety. She was slightly unsteady and self-limited the distance d/t 8/10 headache and pt's desire to return to bed. Patient needs to practice stairs next session. Pt will benefit from acute skilled PT to increase her independence and safety with mobility to allow discharge home with HHPT.       If plan is discharge home, recommend the following: A little help with walking and/or transfers;A little help with bathing/dressing/bathroom;Assistance with cooking/housework;Assist for transportation;Help with stairs or ramp for entrance   Can travel by private vehicle        Equipment Recommendations None recommended by PT  Recommendations for Other Services       Functional Status Assessment Patient has had a recent decline in their functional status and demonstrates the ability to make significant  improvements in function in a reasonable and predictable amount of time.     Precautions / Restrictions Precautions Precautions: Fall Recall of Precautions/Restrictions: Intact Precaution/Restrictions Comments: Permissive HTN Restrictions Weight Bearing Restrictions Per Provider Order: No      Mobility  Bed Mobility Overal bed mobility: Needs Assistance Bed Mobility: Supine to Sit, Sit to Supine     Supine to sit: Supervision Sit to supine: Supervision   General bed mobility comments: Pt sat up on the R side of the stretcher without physical assist or cueing for sequencing. Supervision for safety.    Transfers Overall transfer level: Needs assistance Equipment used: None Transfers: Sit to/from Stand Sit to Stand: Supervision           General transfer comment: Pt stood from stretcher by pushing up with BUE support. No physical assist to stand. Supervision for safety. Good eccentric control with sitting.    Ambulation/Gait Ambulation/Gait assistance: Contact guard assist Gait Distance (Feet): 30 Feet Assistive device: None Gait Pattern/deviations: Step-through pattern, Decreased stride length, Drifts right/left Gait velocity: reduced Gait velocity interpretation: <1.8 ft/sec, indicate of risk for recurrent falls   General Gait Details: Pt ambulated within the hospital room with short, slow steps. Slightly unsteady drifting R/L, but no LOB. CGA for safety. Distance self-limited by pt d/t headache and desire to return to bed.  Stairs            Wheelchair Mobility     Tilt Bed    Modified Rankin (Stroke Patients Only) Modified Rankin (Stroke Patients Only) Pre-Morbid Rankin Score: No symptoms Modified Rankin: No significant disability     Balance Overall balance assessment: Needs assistance  Sitting-balance support: No upper extremity supported, Feet unsupported Sitting balance-Leahy Scale: Good Sitting balance - Comments: Pt sat EOB with  supervision.   Standing balance support: No upper extremity supported, During functional activity Standing balance-Leahy Scale: Good Standing balance comment: Pt ambulated without an AD without LOB and supervision for safety.     Tandem Stance - Right Leg: 8 Tandem Stance - Left Leg: 8 Rhomberg - Eyes Opened: 20 Rhomberg - Eyes Closed: 15                 Pertinent Vitals/Pain Pain Assessment Pain Assessment: 0-10 Pain Score: 8  Pain Location: Head Pain Descriptors / Indicators: Headache, Constant Pain Intervention(s): Limited activity within patient's tolerance, Monitored during session, Repositioned    Home Living Family/patient expects to be discharged to:: Private residence Living Arrangements: Spouse/significant other;Children (Boyfriend and 48 y.o. son) Available Help at Discharge: Family;Available 24 hours/day Type of Home: Apartment Home Access: Level entry     Alternate Level Stairs-Number of Steps: 12 Home Layout: Two level;Bed/bath upstairs Home Equipment: None      Prior Function Prior Level of Function : Independent/Modified Independent;Driving;Working/employed             Mobility Comments: Ambulates without an AD. Performs stairs easily. Denies falls in the last 53mo. ADLs Comments: Indep with ADLs/IADLs. Drives. Works as a Location manager.     Extremity/Trunk Assessment   Upper Extremity Assessment Upper Extremity Assessment: Defer to OT evaluation    Lower Extremity Assessment Lower Extremity Assessment: Overall WFL for tasks assessed    Cervical / Trunk Assessment Cervical / Trunk Assessment: Normal  Communication   Communication Communication: No apparent difficulties    Cognition Arousal: Alert Behavior During Therapy: WFL for tasks assessed/performed   PT - Cognitive impairments: No apparent impairments                       PT - Cognition Comments: Pt A,Ox4 Following commands: Intact       Cueing Cueing  Techniques: Verbal cues, Gestural cues     General Comments General comments (skin integrity, edema, etc.): BP 171/113 (130), HR 103, SpO2 98% on RA    Exercises     Assessment/Plan    PT Assessment Patient needs continued PT services  PT Problem List Decreased mobility;Decreased balance       PT Treatment Interventions Gait training;Stair training;Functional mobility training;Therapeutic activities;Therapeutic exercise;Balance training;Patient/family education    PT Goals (Current goals can be found in the Care Plan section)  Acute Rehab PT Goals Patient Stated Goal: Return Home and ease back into work PT Goal Formulation: With patient Time For Goal Achievement: 06/23/23 Potential to Achieve Goals: Good    Frequency Min 2X/week     Co-evaluation               AM-PAC PT "6 Clicks" Mobility  Outcome Measure Help needed turning from your back to your side while in a flat bed without using bedrails?: A Little Help needed moving from lying on your back to sitting on the side of a flat bed without using bedrails?: A Little Help needed moving to and from a bed to a chair (including a wheelchair)?: A Little Help needed standing up from a chair using your arms (e.g., wheelchair or bedside chair)?: A Little Help needed to walk in hospital room?: A Little Help needed climbing 3-5 steps with a railing? : A Lot 6 Click Score: 17    End of Session Equipment  Utilized During Treatment: Gait belt Activity Tolerance: Patient tolerated treatment well;Patient limited by pain;Patient limited by fatigue Patient left: in bed;with call bell/phone within reach Nurse Communication: Mobility status PT Visit Diagnosis: Other abnormalities of gait and mobility (R26.89);Difficulty in walking, not elsewhere classified (R26.2);Unsteadiness on feet (R26.81)    Time: 1610-9604 PT Time Calculation (min) (ACUTE ONLY): 15 min   Charges:   PT Evaluation $PT Eval Moderate Complexity: 1 Mod   PT  General Charges $$ ACUTE PT VISIT: 1 Visit         Glenford Lanes, PT, DPT Acute Rehabilitation Services Office: [phone] Secure Chat Preferred  Riva Chester 06/09/2023, 9:07 AM

## 2023-06-09 NOTE — H&P (View-Only) (Signed)
 STROKE TEAM PROGRESS NOTE   INTERIM HISTORY/SUBJECTIVE She presented with transient left upper extremity weakness numbness and facial droop which appears to have improved.  Blood pressure was significantly elevated en route which has also now improved.  MRI scan of the brain shows a tiny right Perry insular infarct and CT angiogram shows right M3 clot.   Patient denies any prior history of strokes, TIAs, complicated migraine, DVT or pulmonary embolism.  No history of rashes, recurrent miscarriages.  No family history of stroke or heart attacks at a young age.  No history of sickle cell disease  Reports migraines once a year that are improved with ibuprofen , no aura  TEE scheduled for tomorrow at 1230.   OBJECTIVE  CBC    Component Value Date/Time   WBC 7.5 06/08/2023 1616   RBC 4.50 06/08/2023 1616   HGB 10.9 (L) 06/08/2023 1621   HGB 13.9 07/23/2022 1346   HCT 32.0 (L) 06/08/2023 1621   HCT 41.9 07/23/2022 1346   PLT 155 06/08/2023 1616   PLT 173 07/23/2022 1346   MCV 68.4 (L) 06/08/2023 1616   MCV 94 07/23/2022 1346   MCH 19.1 (L) 06/08/2023 1616   MCHC 27.9 (L) 06/08/2023 1616   RDW 19.5 (H) 06/08/2023 1616   RDW 13.7 07/23/2022 1346   LYMPHSABS 2.3 06/08/2023 1616   LYMPHSABS 2.1 07/23/2022 1346   MONOABS 0.4 06/08/2023 1616   EOSABS 0.1 06/08/2023 1616   EOSABS 0.1 07/23/2022 1346   BASOSABS 0.0 06/08/2023 1616   BASOSABS 0.0 07/23/2022 1346    BMET    Component Value Date/Time   NA 138 06/09/2023 0540   NA 140 07/23/2022 1346   K 3.9 06/09/2023 0540   CL 105 06/09/2023 0540   CO2 20 (L) 06/09/2023 0540   GLUCOSE 93 06/09/2023 0540   BUN 12 06/09/2023 0540   BUN 16 07/23/2022 1346   CREATININE 1.30 (H) 06/09/2023 0540   CALCIUM 8.9 06/09/2023 0540   EGFR 64 07/23/2022 1346   GFRNONAA 57 (L) 06/09/2023 0540    IMAGING past 24 hours CT ANGIO HEAD NECK W WO CM Addendum Date: 06/08/2023 ADDENDUM REPORT: 06/08/2023 23:00 ADDENDUM: Imaging results were  communicated on 06/08/2023 at 10:48 pm to provider Dr. Murvin Arthurs via telephone. Electronically Signed   By: Stevenson Elbe M.D.   On: 06/08/2023 23:00   Result Date: 06/08/2023 CLINICAL DATA:  Stroke/TIA, determine embolic source EXAM: CT ANGIOGRAPHY HEAD AND NECK WITH AND WITHOUT CONTRAST TECHNIQUE: Multidetector CT imaging of the head and neck was performed using the standard protocol during bolus administration of intravenous contrast. Multiplanar CT image reconstructions and MIPs were obtained to evaluate the vascular anatomy. Carotid stenosis measurements (when applicable) are obtained utilizing NASCET criteria, using the distal internal carotid diameter as the denominator. RADIATION DOSE REDUCTION: This exam was performed according to the departmental dose-optimization program which includes automated exposure control, adjustment of the mA and/or kV according to patient size and/or use of iterative reconstruction technique. CONTRAST:  75mL OMNIPAQUE  IOHEXOL  350 MG/ML SOLN COMPARISON:  Same day CT head and MRI head. FINDINGS: CTA NECK FINDINGS Aortic arch: Great vessel origins are patent without significant stenosis. Right carotid system: No evidence of dissection, stenosis (50% or greater), or occlusion. Left carotid system: No evidence of dissection, stenosis (50% or greater), or occlusion. Vertebral arteries: Codominant. No evidence of dissection, stenosis (50% or greater), or occlusion. Skeleton: No acute abnormality on limited assessment. Other neck: No acute abnormality on limited assessment. Upper chest: Visualized lung apices  are clear. Review of the MIP images confirms the above findings CTA HEAD FINDINGS Anterior circulation: Bilateral intracranial and ACAs are patent without proximal hemodynamically significant stenosis. Left MCA is patent without proximal hemodynamically significant stenosis. Occluded right M3 MCA vessel (see series 5, image 92 and series 8, images 130 through 140). Posterior  circulation: Bilateral intradural vertebral arteries, basilar artery and bilateral posterior is are patent without proximal hemodynamically significant stenosis. Bilateral fetal type PCAs with small vertebrobasilar system, anatomic variant. Venous sinuses: No evidence of dural venous sinus thrombosis. Review of the MIP images confirms the above findings IMPRESSION: 1. Occluded right M3 MCA, as described above. 2. Otherwise, no large vessel occlusion or proximal hemodynamically significant stenosis. Electronically Signed: By: Stevenson Elbe M.D. On: 06/08/2023 22:44   MR BRAIN WO CONTRAST Result Date: 06/08/2023 CLINICAL DATA:  Provided history: Neuro deficit, acute, stroke suspected. EXAM: MRI HEAD WITHOUT CONTRAST TECHNIQUE: Multiplanar, multiecho pulse sequences of the brain and surrounding structures were obtained without intravenous contrast. COMPARISON:  Non-contrast head CT performed earlier today 07/05/2023. FINDINGS: Brain: Cerebral volume is normal. Small acute right middle cerebral artery territory cortical infarcts within the insula and at the posterior aspect of the sylvian fissure (for instance as seen on series 5, images 70 and 73). Additional small patchy acute right MCA territory infarcts within the right frontoparietal white matter (series 5, images 76-78). Background multifocal T2 FLAIR hyperintense signal abnormality within the cerebral white matter, mild but unexpected for age. Expanded and partially empty sella turcica. No evidence of an intracranial mass. No chronic intracranial blood products. No extra-axial fluid collection. No midline shift. Vascular: Maintained flow voids within the proximal large arterial vessels. Skull and upper cervical spine: Abnormal T1 hypointense marrow signal within the calvarium and within visible portions of the cervical spine. Sinuses/Orbits: No mass or acute finding within the imaged orbits. No significant paranasal sinus disease. Impression #1 called by  telephone at the time of interpretation on 06/08/2023 at 7:26 pm to provider ANDREW TEE , who verbally acknowledged these results. IMPRESSION: 1. 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 2. Background T2 FLAIR hyperintense signal changes within the cerebral white matter, overall mild but unexpected for age. Findings are nonspecific and differential considerations include age-advanced chronic small vessel ischemic disease, sequelae of chronic migraine headaches, vasculitis, sequelae of a prior infectious/inflammatory process and demyelinating disease, among others. 3. Abnormal T1 hypointense marrow signal within the calvarium and within visible portions of the cervical spine. While this finding can reflect a marrow infiltrative process, the most common causes include chronic anemia, smoking and obesity. Electronically Signed   By: Bascom Lily D.O.   On: 06/08/2023 19:28   CT HEAD CODE STROKE WO CONTRAST Result Date: 06/08/2023 CLINICAL DATA:  Code stroke. Provided history: Neuro deficit, acute, stroke suspected. Left-sided facial droop. Left arm weakness. EXAM: CT HEAD WITHOUT CONTRAST TECHNIQUE: Contiguous axial images were obtained from the base of the skull through the vertex without intravenous contrast. RADIATION DOSE REDUCTION: This exam was performed according to the departmental dose-optimization program which includes automated exposure control, adjustment of the mA and/or kV according to patient size and/or use of iterative reconstruction technique. COMPARISON:  Head CT 03/18/2022. FINDINGS: Brain: Cerebral volume is normal. Prominent perivascular space within the right basal ganglia inferiorly. Expanded and partially empty sella turcica. There is no acute intracranial hemorrhage. No demarcated cortical infarct. No extra-axial fluid collection. No evidence of an intracranial mass. No midline shift. Vascular: No hyperdense vessel. Skull: No calvarial  fracture or aggressive  osseous lesion. Sinuses/Orbits: No mass or acute finding within the imaged orbits. No significant paranasal sinus disease at the imaged levels. ASPECTS [facility] Stroke Program Early CT Score) - Ganglionic level infarction (caudate, lentiform nuclei, internal capsule, insula, M1-M3 cortex): 7 - Supraganglionic infarction (M4-M6 cortex): 3 Total score (0-10 with 10 being normal): 10 No evidence of an acute intracranial abnormality. These results were communicated to Dr. Doretta Gant at 4:32 pmon 6/3/2025by text page via the [facility] messaging system. IMPRESSION: No evidence of an acute intracranial abnormality. Electronically Signed   By: Bascom Lily D.O.   On: 06/08/2023 16:32    Vitals:   06/09/23 0416 06/09/23 0546 06/09/23 0600 06/09/23 0700  BP: (!) 189/129 (!) 174/124 (!) 170/125 (!) 173/120  Pulse:  88 92 94  Resp:  15 (!) 37 19  Temp:  98.1 F (36.7 C)    TempSrc:  Oral    SpO2:  100% 99% 97%  Weight:      Height:         PHYSICAL EXAM General:  Alert, well-nourished, well-developed patient in no acute distress Psych:  Mood and affect appropriate for situation CV: Regular rate and rhythm on monitor Respiratory:  Regular, unlabored respirations on room air GI: Abdomen soft and nontender   NEURO:  Mental Status: AA&Ox3, patient is able to give clear and coherent history Speech/Language: speech is without dysarthria or aphasia.  Naming, repetition, fluency, and comprehension intact.  Cranial Nerves:  II: PERRL. Visual fields full.  III, IV, VI: EOMI. Eyelids elevate symmetrically.  V: Sensation is intact to light touch and symmetrical to face.  VII: Face is symmetrical resting and smiling VIII: hearing intact to voice. IX, X: Palate elevates symmetrically. Phonation is normal.  ZO:[id number] shrug 5/5. XII: tongue is midline without fasciculations. Motor: 5/5 strength to all muscle groups tested.  Tone: is normal and bulk is normal Sensation-diminished left upper extremity sensation  to light touch bilaterally. Extinction absent to light touch to DSS.   Coordination: FTN intact bilaterally, HKS: no ataxia in BLE.No drift.  Gait- deferred  Most Recent NIH 1 Premorbid modified Rankin 0  ASSESSMENT/PLAN  Anita [name] is a 33 y.o. 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 ASPECTS 10.    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 Therapy recommendations:  Pending Disposition:  pending   Hypertension Home meds:  Amlodipine , valsartan , coreg  PRN hydralazine   Blood Pressure Goal: BP less than 220/110   Hyperlipidemia LDL 98, goal < 70 Add atorvastatin  Continue statin at discharge  Tobacco Abuse Patient smokes cigars       Ready to quit? Yes Nicotine replacement therapy provided  Other Stroke Risk Factors Obesity, Body mass index is 36.72 kg/m., BMI >/= 30 associated with increased stroke risk, recommend weight loss, diet and  exercise as appropriate   Other Active Problems Hormonal birth control use No longer on depo-provera    Hospital day # 1  Patient seen and examined by NP/APP with MD. MD to update note as needed.   Imogene Mana, DNP, FNP-BC [facility] Pager: [phone]  I have personally obtained history,examined this patient, reviewed notes, independently viewed imaging studies, participated in medical decision making  and plan of care.ROS completed by me personally and pertinent positives fully documented  I have made any additions or clarifications directly to the above note. Agree with note above.  Patient presented with transient left upper extremity weakness numbness and facial droop due to embolic right MCA branch infarcts of cryptogenic etiology.  Recommend continue ongoing stroke workup.  Check lower extremity venous Dopplers for DVT, TCD bubble study for PFO, ANA panel, anticardiolipin antibodies, sickle cell screen and TEE.  30-day heart monitor for A-fib.  Aspirin  and Plavix for 3 weeks followed by aspirin  alone and aggressive risk factor modification.  Patient also appear to be at risk for obstructive sleep apnea and may benefit with consideration for participation in the sleep smart stroke prevention study.  She was given written information to evaluate and decide.  It was made clear study participation is voluntary and patient can Morella withdraw from the study at any point in the future if she is not satisfied.  She will get the same excellent medical care irrespective of whether she participates in the study or not. Greater than 50% time during this 50-minute visit was spent in counseling and coordination of care and discussion with patient and care team and answering questions. Ardella Beaver, MD Medical Director [facility] Pager: [phone] 06/09/2023 3:10 PM   To contact Stroke Continuity provider, please refer to [email or web address]. After hours, contact General Neurology

## 2023-06-09 NOTE — Evaluation (Signed)
 Occupational Therapy Evaluation Patient Details Name: Anita [name] MRN: [id number] DOB: 10-21-1990 Today's Date: 06/09/2023   History of Present Illness   Anita [name] is a 33 y.o. female who presented to the ED 06/08/23 via EMS for evaluation of left-sided facial droop, slurred speech, left-sided weakness and numbness, and right-sided headache. Found to have high BP 192/114. She has not taken any BP meds for almost a year. MRI brain showed multiple small acute right middle cerebral artery territory infarcts. CTA head and neck showed occluded right M3 MCA. PMH significant fro HTN, gout, arthritis, and anemia.     Clinical Impressions PTA, pt lived with significant other and reports being independent in ADL and IADL. Upon eval, pt presents with decreased balance and minor L weakness compared to baseline as she is L dominant. Pt needing up to supervision for ADL and OOB mobility this session noting mild L and R listing during walking. Pt endorses HA. Pt scoring a 4 on the short blessed test of cognition with deficits predicting time of day and with delayed memory recall. Pt also with inconsistent report of stimulus on LLQ during visual field testing. Recommending OP OT at neuro rehab for initial OP assessment. Pending further acute assessment may be able to progress to no follow up. Will follow acutely.      If plan is discharge home, recommend the following:   A little help with walking and/or transfers;A little help with bathing/dressing/bathroom;Assistance with cooking/housework;Assist for transportation;Help with stairs or ramp for entrance     Functional Status Assessment   Patient has had a recent decline in their functional status and demonstrates the ability to make significant improvements in function in a reasonable and predictable amount of time.     Equipment Recommendations   BSC/3in1     Recommendations for Other Services         Precautions/Restrictions    Precautions Precautions: Fall Recall of Precautions/Restrictions: Intact Precaution/Restrictions Comments: Permissive HTN Restrictions Weight Bearing Restrictions Per Provider Order: No     Mobility Bed Mobility Overal bed mobility: Needs Assistance Bed Mobility: Supine to Sit, Sit to Supine     Supine to sit: Supervision Sit to supine: Supervision   General bed mobility comments: Pt sat up on the R side of the stretcher without physical assist or cueing for sequencing. Supervision for safety.    Transfers Overall transfer level: Needs assistance Equipment used: None Transfers: Sit to/from Stand Sit to Stand: Supervision           General transfer comment: Pt stood from stretcher by pushing up with BUE support. No physical assist to stand. Supervision for safety. Good eccentric control with sitting.      Balance Overall balance assessment: Needs assistance Sitting-balance support: No upper extremity supported, Feet unsupported Sitting balance-Leahy Scale: Good Sitting balance - Comments: Pt sat EOB with supervision.   Standing balance support: No upper extremity supported, During functional activity Standing balance-Leahy Scale: Good Standing balance comment: Pt ambulated without an AD without LOB and supervision for safety.                           ADL either performed or assessed with clinical judgement   ADL Overall ADL's : Needs assistance/impaired Eating/Feeding: Independent   Grooming: Supervision/safety;Standing   Upper Body Bathing: Modified independent;Sitting   Lower Body Bathing: Supervison/ safety;Sit to/from stand   Upper Body Dressing : Modified independent;Sitting   Lower Body Dressing: Supervision/safety;Sit to/from  stand   Toilet Transfer: Supervision/safety           Functional mobility during ADLs: Supervision/safety       Vision Baseline Vision/History: 0 No visual deficits Ability to See in Adequate Light: 0  Adequate Patient Visual Report: No change from baseline Vision Assessment?: Vision impaired- to be further tested in functional context;Yes Eye Alignment: Within Functional Limits Ocular Range of Motion: Within Functional Limits Tracking/Visual Pursuits: Able to track stimulus in all quads without difficulty Saccades: Within functional limits Convergence: Within functional limits Visual Fields: Other (comment) (able to detect stimulus but more difficulty reporting number of fingers held up in LLQ)     Perception         Praxis         Pertinent Vitals/Pain Pain Assessment Pain Assessment: Faces Faces Pain Scale: Hurts little more Pain Location: Head Pain Descriptors / Indicators: Headache, Constant Pain Intervention(s): Limited activity within patient's tolerance, Monitored during session     Extremity/Trunk Assessment Upper Extremity Assessment Upper Extremity Assessment: Left hand dominant;Overall WFL for tasks assessed (functional for tasks assessed, but pt reports being "very" L handed and noting L slightly weaker than R but grossly 5/5)   Lower Extremity Assessment Lower Extremity Assessment: Defer to PT evaluation   Cervical / Trunk Assessment Cervical / Trunk Assessment: Normal   Communication Communication Communication: No apparent difficulties   Cognition Arousal: Alert Behavior During Therapy: WFL for tasks assessed/performed Cognition: No apparent impairments             OT - Cognition Comments: pt scored a 4 on the short blessed test will continue to assess                 Following commands: Intact       Cueing  General Comments   Cueing Techniques: Verbal cues;Gestural cues  VSS during session   Exercises     Shoulder Instructions      Home Living Family/patient expects to be discharged to:: Private residence Living Arrangements: Spouse/significant other;Children (Boyfriend and 62 y.o. son) Available Help at Discharge:  Family;Available 24 hours/day Type of Home: Apartment Home Access: Level entry     Home Layout: Two level;Bed/bath upstairs Alternate Level Stairs-Number of Steps: 12 Alternate Level Stairs-Rails: Right;Left;Can reach both Bathroom Shower/Tub: Chief Strategy Officer: Standard     Home Equipment: None          Prior Functioning/Environment Prior Level of Function : Independent/Modified Independent;Driving;Working/employed             Mobility Comments: Ambulates without an AD. Performs stairs easily. Denies falls in the last 36mo. ADLs Comments: Indep with ADLs/IADLs. Drives. Works as a Location manager.    OT Problem List: Decreased strength;Impaired balance (sitting and/or standing);Decreased activity tolerance;Decreased cognition;Impaired vision/perception   OT Treatment/Interventions: Self-care/ADL training;Therapeutic exercise;DME and/or AE instruction;Therapeutic activities;Patient/family education;Balance training      OT Goals(Current goals can be found in the care plan section)   Acute Rehab OT Goals Patient Stated Goal: go home OT Goal Formulation: With patient Time For Goal Achievement: 06/23/23 Potential to Achieve Goals: Good   OT Frequency:  Min 2X/week    Co-evaluation              AM-PAC OT "6 Clicks" Daily Activity     Outcome Measure Help from another person eating meals?: None Help from another person taking care of personal grooming?: A Little Help from another person toileting, which includes using toliet, bedpan, or urinal?:  A Little Help from another person bathing (including washing, rinsing, drying)?: A Little Help from another person to put on and taking off regular upper body clothing?: None Help from another person to put on and taking off regular lower body clothing?: A Little 6 Click Score: 20   End of Session Equipment Utilized During Treatment: Gait belt Nurse Communication: Mobility status  Activity  Tolerance: Patient tolerated treatment well Patient left: in bed;with call bell/phone within reach;with family/visitor present  OT Visit Diagnosis: Unsteadiness on feet (R26.81);Muscle weakness (generalized) (M62.81);Other symptoms and signs involving cognitive function                Time: 1610-9604 OT Time Calculation (min): 18 min Charges:  OT General Charges $OT Visit: 1 Visit OT Evaluation $OT Eval Moderate Complexity: 1 Mod  Karilyn Ouch, OTR/L [facility] Acute Rehabilitation Office: [phone]   Emery Hans 06/09/2023, 11:20 AM

## 2023-06-09 NOTE — Progress Notes (Signed)
   [facility] has been requested to perform a transesophageal echocardiogram on Anita [name] for a stroke workup.      The patient does NOT have any absolute or relative contraindications to a Transesophageal Echocardiogram (TEE).  Has symptoms concerning for OSA and a BMI of 36.7 but no formal diagnosis of OSA.  The patient has: No other conditions that may impact this procedure.    After careful review of history and examination, the risks and benefits of transesophageal echocardiogram have been explained including risks of esophageal damage, perforation (1:10,000 risk), bleeding, pharyngeal hematoma as well as other potential complications associated with conscious sedation including aspiration, arrhythmia, respiratory failure and death. Alternatives to treatment were discussed, questions were answered. Patient is willing to proceed.   Eloy Half, PA-C  06/09/2023 2:29 PM

## 2023-06-09 NOTE — ED Notes (Signed)
 Pt ambulatory to bathroom

## 2023-06-10 ENCOUNTER — Inpatient Hospital Stay (HOSPITAL_COMMUNITY)

## 2023-06-10 ENCOUNTER — Encounter (HOSPITAL_COMMUNITY)
Admission: EM | Disposition: A | Discharge status: Home / Self Care | Payer: Self-pay | Source: Home / Self Care | Attending: Internal Medicine

## 2023-06-10 ENCOUNTER — Other Ambulatory Visit (HOSPITAL_COMMUNITY)

## 2023-06-10 ENCOUNTER — Encounter (HOSPITAL_COMMUNITY): Payer: Self-pay | Admitting: Cardiology

## 2023-06-10 ENCOUNTER — Other Ambulatory Visit: Payer: Self-pay | Admitting: Cardiology

## 2023-06-10 ENCOUNTER — Encounter (HOSPITAL_COMMUNITY)

## 2023-06-10 DIAGNOSIS — E785 Hyperlipidemia, unspecified: Secondary | ICD-10-CM

## 2023-06-10 DIAGNOSIS — F17200 Nicotine dependence, unspecified, uncomplicated: Secondary | ICD-10-CM | POA: Diagnosis not present

## 2023-06-10 DIAGNOSIS — I63511 Cerebral infarction due to unspecified occlusion or stenosis of right middle cerebral artery: Secondary | ICD-10-CM | POA: Diagnosis not present

## 2023-06-10 DIAGNOSIS — R29701 NIHSS score 1: Secondary | ICD-10-CM

## 2023-06-10 DIAGNOSIS — I3139 Other pericardial effusion (noninflammatory): Secondary | ICD-10-CM | POA: Diagnosis not present

## 2023-06-10 DIAGNOSIS — Z6836 Body mass index (BMI) 36.0-36.9, adult: Secondary | ICD-10-CM

## 2023-06-10 DIAGNOSIS — F17210 Nicotine dependence, cigarettes, uncomplicated: Secondary | ICD-10-CM

## 2023-06-10 DIAGNOSIS — E669 Obesity, unspecified: Secondary | ICD-10-CM

## 2023-06-10 DIAGNOSIS — I10 Essential (primary) hypertension: Secondary | ICD-10-CM

## 2023-06-10 DIAGNOSIS — I639 Cerebral infarction, unspecified: Secondary | ICD-10-CM | POA: Diagnosis not present

## 2023-06-10 DIAGNOSIS — I161 Hypertensive emergency: Secondary | ICD-10-CM | POA: Diagnosis not present

## 2023-06-10 LAB — HEMOGLOBIN A1C
Hgb A1c MFr Bld: 4.9 % (ref 4.8–5.6)
Mean Plasma Glucose: 93.93 mg/dL

## 2023-06-10 LAB — ECHO TEE
AR max vel: 3.64 cm2
AV Area VTI: 3.85 cm2
AV Area mean vel: 3.69 cm2
AV Mean grad: 2 mmHg
AV Peak grad: 3.5 mmHg
Ao pk vel: 0.93 m/s

## 2023-06-10 LAB — SICKLE CELL SCREEN: Sickle Cell Screen: NEGATIVE

## 2023-06-10 LAB — ANA W/REFLEX IF POSITIVE: Anti Nuclear Antibody (ANA): NEGATIVE

## 2023-06-10 SURGERY — TRANSESOPHAGEAL ECHOCARDIOGRAM (TEE) (CATHLAB)
Anesthesia: Monitor Anesthesia Care

## 2023-06-10 MED ORDER — SODIUM CHLORIDE 0.9 % IV SOLN: INTRAVENOUS | Status: DC | PRN

## 2023-06-10 MED ORDER — IRBESARTAN 150 MG PO TABS
75.0000 mg | ORAL_TABLET | Freq: Every day | ORAL | Status: DC
  Administered 2023-06-10 – 2023-06-11 (×2): 75 mg via ORAL
  Filled 2023-06-10 (×2): qty 1

## 2023-06-10 MED ORDER — LABETALOL HCL 5 MG/ML IV SOLN
INTRAVENOUS | Status: DC | PRN
  Administered 2023-06-10: 7.5 mg via INTRAVENOUS

## 2023-06-10 MED ORDER — CARVEDILOL 12.5 MG PO TABS
12.5000 mg | ORAL_TABLET | Freq: Two times a day (BID) | ORAL | Status: DC
  Administered 2023-06-10 – 2023-06-12 (×4): 12.5 mg via ORAL
  Filled 2023-06-10 (×4): qty 1

## 2023-06-10 MED ORDER — LABETALOL HCL 5 MG/ML IV SOLN: 10.0000 mg | INTRAVENOUS | Status: DC | PRN

## 2023-06-10 MED ORDER — HYDRALAZINE HCL 20 MG/ML IJ SOLN
10.0000 mg | INTRAMUSCULAR | Status: DC | PRN
  Administered 2023-06-11: 10 mg via INTRAVENOUS
  Filled 2023-06-10: qty 1

## 2023-06-10 MED ORDER — PROPOFOL 500 MG/50ML IV EMUL
INTRAVENOUS | Status: DC | PRN
  Administered 2023-06-10: 125 ug/kg/min via INTRAVENOUS

## 2023-06-10 NOTE — Plan of Care (Signed)
  Patient and sisters acknowledge full understanding that she needs to make some life style changes such as stop smoking.  Problem: Education: Goal: Knowledge of disease or condition will improve Outcome: Progressing Goal: Knowledge of secondary prevention will improve (MUST DOCUMENT ALL) Outcome: Progressing Goal: Knowledge of patient specific risk factors will improve (DELETE if not current risk factor) Outcome: Progressing   Problem: Ischemic Stroke/TIA Tissue Perfusion: Goal: Complications of ischemic stroke/TIA will be minimized Outcome: Progressing   Problem: Health Behavior/Discharge Planning: Goal: Ability to manage health-related needs will improve Outcome: Progressing

## 2023-06-10 NOTE — CV Procedure (Signed)
    TRANSESOPHAGEAL ECHOCARDIOGRAM   NAME:  MALAI LADY    MRN: [id number] DOB:  05-08-1990    ADMIT DATE: 06/08/2023  INDICATIONS: Stroke   PROCEDURE:   Informed consent was obtained prior to the procedure. The risks, benefits and alternatives for the procedure were discussed and the patient comprehended these risks.  Risks include, but are not limited to, cough, sore throat, vomiting, nausea, somnolence, esophageal and stomach trauma or perforation, bleeding, low blood pressure, aspiration, pneumonia, infection, trauma to the teeth and death.    Procedural time out performed.   Anesthesia was administered by anesthesia team (see their records).  The transesophageal probe was inserted in the esophagus and stomach without difficulty and multiple views were obtained.   COMPLICATIONS:    There were no immediate complications.  KEY FINDINGS:  LVEF 60-65%. No thrombus in left atrium or left atrial appendage.  No patent foramen ovale.   Full report to follow. Patient prior to the procedure did not provide verbal consent to discuss her result w/ anyone particular.  Further management per primary team.   Olinda Bertrand, DO, [facility]  A Division of [facility] [address], [place], [place] [address]  [place], [place] [address] Pager: [phone] Office: [phone] 06/10/23 10:30 AM

## 2023-06-10 NOTE — Anesthesia Preprocedure Evaluation (Addendum)
 Anesthesia Evaluation  Patient identified by MRN, date of birth, ID band Patient awake    Reviewed: Allergy & Precautions, H&P , NPO status , Patient's Chart, lab work & pertinent test results  Airway Mallampati: II  TM Distance: >3 FB Neck ROM: Full    Dental no notable dental hx.    Pulmonary Current Smoker   Pulmonary exam normal breath sounds clear to auscultation       Cardiovascular hypertension, (-) angina (-) Past MI Normal cardiovascular exam Rhythm:Regular Rate:Normal     Neuro/Psych Left upper extremity weakness CVA, Residual Symptoms  negative psych ROS   GI/Hepatic negative GI ROS, Neg liver ROS,,,  Endo/Other  negative endocrine ROS    Renal/GU negative Renal ROS  negative genitourinary   Musculoskeletal  (+) Arthritis ,    Abdominal   Peds negative pediatric ROS (+)  Hematology  (+) Blood dyscrasia, anemia   Anesthesia Other Findings   Reproductive/Obstetrics negative OB ROS                             Anesthesia Physical Anesthesia Plan  ASA: 3  Anesthesia Plan: MAC   Post-op Pain Management:    Induction: Intravenous  PONV Risk Score and Plan: Propofol infusion and Treatment may vary due to age or medical condition  Airway Management Planned: Natural Airway  Additional Equipment:   Intra-op Plan:   Post-operative Plan:   Informed Consent: I have reviewed the patients History and Physical, chart, labs and discussed the procedure including the risks, benefits and alternatives for the proposed anesthesia with the patient or authorized representative who has indicated his/her understanding and acceptance.     Dental advisory given  Plan Discussed with: CRNA  Anesthesia Plan Comments:         Anesthesia Quick Evaluation

## 2023-06-10 NOTE — Anesthesia Postprocedure Evaluation (Signed)
 Anesthesia Post Note  Patient: Anita [name]  Procedure(s) Performed: TRANSESOPHAGEAL ECHOCARDIOGRAM     Patient location during evaluation: Cath Lab Anesthesia Type: MAC Level of consciousness: awake and alert Pain management: pain level controlled Vital Signs Assessment: post-procedure vital signs reviewed and stable Respiratory status: spontaneous breathing, nonlabored ventilation, respiratory function stable and patient connected to nasal cannula oxygen Cardiovascular status: stable and blood pressure returned to baseline Postop Assessment: no apparent nausea or vomiting Anesthetic complications: no   No notable events documented.  Last Vitals:  Vitals:   06/10/23 1038 06/10/23 1048  BP: (!) 135/101 (!) 144/106  Pulse: 91 91  Resp: 20 19  Temp:    SpO2: 97% 98%    Last Pain:  Vitals:   06/10/23 1048  TempSrc:   PainSc: 0-No pain                 Lethaniel Rave

## 2023-06-10 NOTE — Transfer of Care (Signed)
 Immediate Anesthesia Transfer of Care Note  Patient: Anita [name]  Procedure(s) Performed: TRANSESOPHAGEAL ECHOCARDIOGRAM  Patient Location: PACU  Anesthesia Type:MAC  Level of Consciousness: awake, alert , and oriented  Airway & Oxygen Therapy: Patient Spontanous Breathing and Patient connected to nasal cannula oxygen  Post-op Assessment: Report given to RN and Post -op Vital signs reviewed and stable  Post vital signs: Reviewed and stable  Last Vitals:  Vitals Value Taken Time  BP 137/94 06/10/23 1028  Temp 36.9 C 06/10/23 1028  Pulse 88 06/10/23 1028  Resp 23 06/10/23 1028  SpO2 94 % 06/10/23 1028  Vitals shown include unfiled device data.  Last Pain:  Vitals:   06/10/23 1028  TempSrc: Tympanic  PainSc: Asleep         Complications: No notable events documented.

## 2023-06-10 NOTE — Progress Notes (Signed)
 Occupational Therapy Treatment Patient Details Name: Anita [name] MRN: [id number] DOB: 1990-08-06 Today's Date: 06/10/2023   History of present illness Anita [name] is a 33 y.o. female who presented to the ED 06/08/23 via EMS for evaluation of left-sided facial droop, slurred speech, left-sided weakness and numbness, and right-sided headache. Found to have high BP 192/114. She has not taken any BP meds for almost a year. MRI brain showed multiple small acute right middle cerebral artery territory infarcts. CTA head and neck showed occluded right M3 MCA. PMH significant fro HTN, gout, arthritis, and anemia.   OT comments  Pt reports dropping items with her dominant L hand, upon further testing she was noted to have decreased FMC of LUE and impaired sensation/proprioception to that extremity. Educated pt on [facility] HEP and demonstrated activities that can be performed with household objects to further work on coordination, performed pill box test to assess higher level cog skills. Pt identifying and correcting some errors but has a harder time processing those errors over time, discussed with pt and s/o to have supervision with medication management. OT to continue to progress pt as able.       If plan is discharge home, recommend the following:  A little help with walking and/or transfers;A little help with bathing/dressing/bathroom;Assistance with cooking/housework;Assist for transportation;Help with stairs or ramp for entrance   Equipment Recommendations       Recommendations for Other Services      Precautions / Restrictions Precautions Precautions: Fall Recall of Precautions/Restrictions: Intact Precaution/Restrictions Comments: Permissive HTN Restrictions Weight Bearing Restrictions Per Provider Order: No       Mobility Bed Mobility Overal bed mobility: Modified Independent                  Transfers                         Balance Overall balance assessment:  Mild deficits observed, not formally tested                                         ADL either performed or assessed with clinical judgement   ADL                                         General ADL Comments: focused session on [facility] HEP and pillbox assessment.    Extremity/Trunk Assessment Upper Extremity Assessment Upper Extremity Assessment: LUE deficits/detail LUE Deficits / Details: impaired opposition of digits to thumb, L pinky noted to always veer past the thumb especially with vision occluded. Upon further assessment pt noted to have decreased proprioception and sensation to touch along the posterior aspect of her L forearm down to her digits, the anterior aspect of her arm and shoulder/biceps regions were intact. She also reports dropping things when picking up objects with L hand. LUE Sensation: decreased light touch;decreased proprioception LUE Coordination: decreased fine motor            Vision   Additional Comments: Performed tracking and confrontation testing, pt accuaretly calling out # of therapists fingers with single eye occlusion and at far distances. She denies blurry or double vision throughout testing.   Perception     Praxis     Communication Communication Communication: No apparent difficulties  Cognition Arousal: Alert Behavior During Therapy: WFL for tasks assessed/performed Cognition: Cognition impaired       Memory impairment (select all impairments): Short-term memory, Working Biochemist, clinical functioning impairment (select all impairments): Problem solving OT - Cognition Comments: Pt given pill box test to assess higher level function with med management. Pt initally doing well with re-reading and self correct errors during testing, began to need several cues as time went on and directions became more challenging for pt. Pt made 3 mistakes when interpreting "one pill every other day" and had trouble finding  her mistake during this particular quesiton, when omitting pt's error she was able to comprehend it better.                 Following commands: Intact        Cueing   Cueing Techniques: Verbal cues, Gestural cues  Exercises Other Exercises Other Exercises: pick up and placing objects of various sizes (x3) to work on in hand manipulation. Other Exercises: used pillbox as test medium to occasionally have pt pickup and place small items in container with LUE.    Shoulder Instructions       General Comments no signs/symptoms of cardiac/respiratory distress during session    Pertinent Vitals/ Pain       Pain Assessment Pain Assessment: Faces Faces Pain Scale: Hurts little more Pain Location: headache Pain Descriptors / Indicators: Headache Pain Intervention(s): Monitored during session  Home Living                                          Prior Functioning/Environment              Frequency  Min 2X/week        Progress Toward Goals  OT Goals(current goals can now be found in the care plan section)  Progress towards OT goals: Progressing toward goals  Acute Rehab OT Goals Patient Stated Goal: go home OT Goal Formulation: With patient Time For Goal Achievement: 06/23/23 Potential to Achieve Goals: Good  Plan      Co-evaluation                 AM-PAC OT "6 Clicks" Daily Activity     Outcome Measure   Help from another person eating meals?: None Help from another person taking care of personal grooming?: A Little Help from another person toileting, which includes using toliet, bedpan, or urinal?: A Little Help from another person bathing (including washing, rinsing, drying)?: A Little Help from another person to put on and taking off regular upper body clothing?: None Help from another person to put on and taking off regular lower body clothing?: A Little 6 Click Score: 20    End of Session Equipment Utilized During  Treatment: Other (comment) (pill box test)  OT Visit Diagnosis: Unsteadiness on feet (R26.81);Muscle weakness (generalized) (M62.81);Other symptoms and signs involving cognitive function   Activity Tolerance Patient tolerated treatment well   Patient Left in bed;with call bell/phone within reach;with family/visitor present   Nurse Communication Mobility status        Time: 1435-1531 OT Time Calculation (min): 56 min  Charges: OT General Charges $OT Visit: 1 Visit OT Treatments $Therapeutic Activity: 23-37 mins $Cognitive Funtion inital: Initial 15 mins $Cognitive Funtion additional: Additional15 mins  06/10/2023  AB, OTR/L  Acute Rehabilitation Services  Office: [phone]   Jorene New  06/10/2023, 6:13 PM

## 2023-06-10 NOTE — TOC Initial Note (Signed)
 Transition of Care [facility]) - Initial/Assessment Note    Patient Details  Name: Anita [name] MRN: [id number] Date of Birth: 1990/10/28  Transition of [facility]) CM/SW Contact:    Jonathan Neighbor, RN Phone Number: 06/10/2023, 2:10 PM  Clinical Narrative:                  Pt is from home with her boyfriend. She says he can provide extra supervision after discharge.  Boyfriend drives.  She wasn't taking any medications prior to admission. CM discussed with her having her boyfriend oversee/ assist with medications after dc. No PCP. She asked for assistance in finding a PCP. CM has placed appt on AVS.  Outpatient therapies arranged with [facility]. Information on the AVS. Pt will call to schedule the first appointment. TOC following.  Expected Discharge Plan: OP Rehab Barriers to Discharge: Continued Medical Work up   Patient Goals and CMS Choice     Choice offered to / list presented to : Patient      Expected Discharge Plan and Services   Discharge Planning Services: CM Consult   Living arrangements for the past 2 months: Apartment                                      Prior Living Arrangements/Services Living arrangements for the past 2 months: Apartment Lives with:: Significant Other Patient language and need for interpreter reviewed:: Yes Do you feel safe going back to the place where you live?: Yes        Care giver support system in place?: Yes (comment)   Criminal Activity/Legal Involvement Pertinent to Current Situation/Hospitalization: No - Comment as needed  Activities of Daily Living      Permission Sought/Granted                  Emotional Assessment Appearance:: Appears stated age Attitude/Demeanor/Rapport: Engaged Affect (typically observed): Accepting Orientation: : Oriented to Self, Oriented to Place, Oriented to  Time, Oriented to Situation   Psych Involvement: No (comment)  Admission diagnosis:  Acute ischemic right MCA stroke  (HCC) [I63.511] Acute ischemic stroke [facility]) [I63.9] Patient Active Problem List   Diagnosis Date Noted   Obesity (BMI 30-39.9) 06/09/2023   Acute ischemic right MCA stroke (HCC) 06/08/2023   Thyromegaly 07/23/2022   Tobacco use 07/23/2022   Uncontrolled hypertension 07/07/2022   Depo-Provera  contraceptive status 07/07/2022   Hypertensive emergency 03/19/2022   Elevated troponin 03/19/2022   Cardiomegaly 03/19/2022   Microcytic anemia 03/19/2022   Encounter for elective termination of pregnancy 10/12/2014   PCP:  Collins Dean, NP Pharmacy:   [id number] Jonette Nestle, [place] - [address] AT [facility] & [address] [place] [address] Phone: [phone] Fax: [phone]     Social Drivers of Health (SDOH) Social History: SDOH Screenings   Food Insecurity: No Food Insecurity (03/19/2022)  Housing: Low Risk  (03/19/2022)  Transportation Needs: No Transportation Needs (03/19/2022)  Utilities: At Risk (03/19/2022)  Depression (PHQ2-9): Low Risk  (07/23/2022)  Recent Concern: Depression (PHQ2-9) - Medium Risk (05/08/2022)  Tobacco Use: High Risk (06/08/2023)   SDOH Interventions:     Readmission Risk Interventions    03/19/2022    2:14 PM  Readmission Risk Prevention Plan  Post Dischage Appt Complete  Medication Screening Complete  Transportation Screening Complete

## 2023-06-10 NOTE — Progress Notes (Signed)
 Physical Therapy Treatment Patient Details Name: Anita [name] MRN: [id number] DOB: December 04, 1990 Today's Date: 06/10/2023   History of Present Illness Anita [name] is a 33 y.o. female who presented to the ED 06/08/23 via EMS for evaluation of left-sided facial droop, slurred speech, left-sided weakness and numbness, and right-sided headache. Found to have high BP 192/114. She has not taken any BP meds for almost a year. MRI brain showed multiple small acute right middle cerebral artery territory infarcts. CTA head and neck showed occluded right M3 MCA. PMH significant fro HTN, gout, arthritis, and anemia.    PT Comments  Pt is progressing well towards goals. Currently pt is Mod I for bed mobility, Supervision/CGA for sit to stand and gait without an AD. Pt was able to navigate stairs per home set up with CGA. Partner is present and supportive. Due to pt current functional status, home set up and available assistance at home recommending skilled physical therapy services 3x/week in order to address strength, balance and functional mobility to decrease risk for falls, injury and re-hospitalization.       If plan is discharge home, recommend the following: A little help with walking and/or transfers;Assistance with cooking/housework;Assist for transportation;Help with stairs or ramp for entrance     Equipment Recommendations  Rolling walker (2 wheels)       Precautions / Restrictions Precautions Precautions: Fall Recall of Precautions/Restrictions: Intact Precaution/Restrictions Comments: Permissive HTN Restrictions Weight Bearing Restrictions Per Provider Order: No     Mobility  Bed Mobility Overal bed mobility: Modified Independent Bed Mobility: Supine to Sit, Sit to Supine     Supine to sit: Modified independent (Device/Increase time) Sit to supine: Modified independent (Device/Increase time)        Transfers Overall transfer level: Needs assistance Equipment used:  None Transfers: Sit to/from Stand Sit to Stand: Supervision           General transfer comment: supervision for safety and steadying    Ambulation/Gait Ambulation/Gait assistance: Supervision, Contact guard assist Gait Distance (Feet): 250 Feet Assistive device: None Gait Pattern/deviations: Step-through pattern, Decreased stride length Gait velocity: decreased Gait velocity interpretation: 1.31 - 2.62 ft/sec, indicative of limited community ambulator   General Gait Details: pt has no arm swing with gait and tends to look at her feet. Short step length   Stairs Stairs: Yes Stairs assistance: Contact guard assist Stair Management: One rail Left, Step to pattern, Sideways, Forwards Number of Stairs: 10 General stair comments: performed flight 2x 1x with therapist, 1x with spouse. sideways descdning and forwards ascending with one rail on the L Per home set up   Modified Rankin (Stroke Patients Only) Modified Rankin (Stroke Patients Only) Pre-Morbid Rankin Score: No symptoms Modified Rankin: Slight disability     Balance Overall balance assessment: Modified Independent, Mild deficits observed, not formally tested Sitting-balance support: No upper extremity supported, Feet unsupported Sitting balance-Leahy Scale: Good     Standing balance support: No upper extremity supported, During functional activity Standing balance-Leahy Scale: Fair Standing balance comment: Pt ambulated without an AD without LOB and supervision for safety.          Communication Communication Communication: No apparent difficulties  Cognition Arousal: Alert Behavior During Therapy: WFL for tasks assessed/performed   PT - Cognitive impairments: No apparent impairments       Following commands: Intact      Cueing Cueing Techniques: Verbal cues, Gestural cues     General Comments General comments (skin integrity, edema, etc.): no signs/symptoms of  cardiac/respiratory distress during  session      Pertinent Vitals/Pain Pain Assessment Pain Assessment: No/denies pain    Home Living       Type of Home: Apartment           PT Goals (current goals can now be found in the care plan section) Acute Rehab PT Goals Patient Stated Goal: Return Home and ease back into work PT Goal Formulation: With patient Time For Goal Achievement: 06/23/23 Potential to Achieve Goals: Good Progress towards PT goals: Progressing toward goals    Frequency    Min 2X/week      PT Plan  Updated DC plan        AM-PAC PT "6 Clicks" Mobility   Outcome Measure  Help needed turning from your back to your side while in a flat bed without using bedrails?: None Help needed moving from lying on your back to sitting on the side of a flat bed without using bedrails?: None Help needed moving to and from a bed to a chair (including a wheelchair)?: A Little Help needed standing up from a chair using your arms (e.g., wheelchair or bedside chair)?: A Little Help needed to walk in hospital room?: A Little Help needed climbing 3-5 steps with a railing? : A Little 6 Click Score: 20    End of Session Equipment Utilized During Treatment: Gait belt Activity Tolerance: Patient tolerated treatment well;Patient limited by fatigue Patient left: in bed;with call bell/phone within reach;with family/visitor present Nurse Communication: Mobility status PT Visit Diagnosis: Other abnormalities of gait and mobility (R26.89);Difficulty in walking, not elsewhere classified (R26.2);Unsteadiness on feet (R26.81)     Time: 1610-9604 PT Time Calculation (min) (ACUTE ONLY): 17 min  Charges:    $Gait Training: 8-22 mins PT General Charges $$ ACUTE PT VISIT: 1 Visit                     Sloan Duncans, DPT, CLT  Acute Rehabilitation Services Office: [phone] (Secure chat preferred)    Jenice Mitts 06/10/2023, 2:45 PM

## 2023-06-10 NOTE — Evaluation (Signed)
 Speech Language Pathology Evaluation Patient Details Name: Anita [name] MRN: [id number] DOB: 1990/09/29 Today's Date: 06/10/2023 Time: 2725-3664 SLP Time Calculation (min) (ACUTE ONLY): 22 min  Problem List:  Patient Active Problem List   Diagnosis Date Noted   Obesity (BMI 30-39.9) 06/09/2023   Acute ischemic right MCA stroke (HCC) 06/08/2023   Thyromegaly 07/23/2022   Tobacco use 07/23/2022   Uncontrolled hypertension 07/07/2022   Depo-Provera  contraceptive status 07/07/2022   Hypertensive emergency 03/19/2022   Elevated troponin 03/19/2022   Cardiomegaly 03/19/2022   Microcytic anemia 03/19/2022   Encounter for elective termination of pregnancy 10/12/2014   Past Medical History:  Past Medical History:  Diagnosis Date   Anemia    Arthritis    Gout    Hypertension    Past Surgical History:  Past Surgical History:  Procedure Laterality Date   KNEE SURGERY     LEG SURGERY     HPI:  Ms. Avis is a 33 year old female with PMH HTN, anemia, arthritis, gout who presented with left-sided weakness and numbness along with slurred speech and left facial droop.  MRI brain showed multiple small acute right MCA infarcts.   Assessment / Plan / Recommendation Clinical Impression  Pt exhibited mild cognitive impairments as evidenced on the SLUMS receiving score of 24/30 with  (27/30 considered normal range).  Of concern missed points on several simple tasks taking her approximately 3-4 minutes to add $3 plus $20 and could not subtract from $100.  She could not recall what state [place] was in. Pt voiced concern with her performance and stated she normally would not have had difficulty in those areas. She showed strengths in the areas of orientation, memory, divergent naming, clock drawing. 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 She is functional/safe in the acute setting and ST will not see while here but she was receptive to having follow up at outpatient for evaluation.    SLP  Assessment  SLP Recommendation/Assessment: All further Speech Language Pathology needs can be addressed in the next venue of care SLP Visit Diagnosis: Cognitive communication deficit (R41.841)     Assistance Recommended at Discharge     Functional Status Assessment    Frequency and Duration           SLP Evaluation Cognition  Overall Cognitive Status: Impaired/Different from baseline Arousal/Alertness: Awake/alert Orientation Level: Oriented X4 Year: 2025 Day of Week: Correct Attention: Sustained Sustained Attention: Appears intact Memory: Appears intact Awareness: Appears intact Problem Solving: Impaired Problem Solving Impairment: Verbal basic Safety/Judgment: Appears intact       Comprehension  Auditory Comprehension Overall Auditory Comprehension: Appears within functional limits for tasks assessed Visual Recognition/Discrimination Discrimination: Not tested Reading Comprehension Reading Status: Not tested    Expression Expression Primary Mode of Expression: Verbal Verbal Expression Overall Verbal Expression: Appears within functional limits for tasks assessed Level of Generative/Spontaneous Verbalization: Conversation Repetition:  (NT) Naming: No impairment Pragmatics: No impairment Written Expression Dominant Hand: Left Written Expression: Not tested   Oral / Motor  Oral Motor/Sensory Function Overall Oral Motor/Sensory Function: Within functional limits (facial weakness has resolved) Motor Speech Overall Motor Speech: Appears within functional limits for tasks assessed Respiration: Within functional limits Phonation: Normal Resonance: Within functional limits Articulation: Within functional limitis Intelligibility: Intelligible Motor Planning: Within functional limits Motor Speech Errors: Not applicable            [name] 06/10/2023, 1:55 PM

## 2023-06-10 NOTE — Progress Notes (Signed)
 Speech Language Pathology  Patient Details Name: Anita [name] MRN: [id number] DOB: Sep 27, 1990 Today's Date: 06/10/2023 Time:  -     Order for speech-language-cognitive eval. Pt currently in TEE. Will complete when able.    [name]  06/10/2023, 9:00 AM

## 2023-06-10 NOTE — Interval H&P Note (Signed)
 History and Physical Interval Note:  06/10/2023 8:34 AM  Anita [name]  has presented today for surgery, with the diagnosis of CVA.  The various methods of treatment have been discussed with the patient and family. After consideration of risks, benefits and other options for treatment, the patient has consented to  Procedure(s): TRANSESOPHAGEAL ECHOCARDIOGRAM (N/A) as a surgical intervention.  The patient's history has been reviewed, patient examined, no change in status, stable for surgery.  I have reviewed the patient's chart and labs.  Questions were answered to the patient's satisfaction.    Informed Consent   Shared Decision Making/Informed Consent   The risks [esophageal damage, perforation (1:10,000 risk), bleeding, pharyngeal hematoma as well as other potential complications associated with conscious sedation including aspiration, arrhythmia, respiratory failure and death], benefits (treatment guidance and diagnostic support) and alternatives of a transesophageal echocardiogram were discussed in detail with [name] and she is willing to proceed.      Patient did not provide consent to discuss TEE findings with anyone else post-procedure.   Awilda Bogus, [facility]  A Division of [facility] [address] [place], [place] [address]  [place], [place] [address] 8:35 AM 06/10/23

## 2023-06-10 NOTE — Progress Notes (Signed)
   30 day event monitor requested by neurology for patient s/p CVA. Results will go to DOD Dr. Emmette Harms. Follow up appointment after monitor also scheduled.  Anita Prince, PA-C

## 2023-06-10 NOTE — Progress Notes (Signed)
 STROKE TEAM PROGRESS NOTE   INTERIM HISTORY/SUBJECTIVE Patient just returned from TEE which was normal with no evidence of PFO or clot.  Patient did sign consent to participate in his sleep smart study but unfortunately could not do NOx 3 monitor last night due to technical issues and it will be done tonight.  No complaints.  OBJECTIVE  CBC    Component Value Date/Time   WBC 9.8 06/09/2023 0807   RBC 4.62 06/09/2023 0807   HGB 8.8 (L) 06/09/2023 0807   HGB 13.9 07/23/2022 1346   HCT 31.8 (L) 06/09/2023 0807   HCT 41.9 07/23/2022 1346   PLT 165 06/09/2023 0807   PLT 173 07/23/2022 1346   MCV 68.8 (L) 06/09/2023 0807   MCV 94 07/23/2022 1346   MCH 19.0 (L) 06/09/2023 0807   MCHC 27.7 (L) 06/09/2023 0807   RDW 19.9 (H) 06/09/2023 0807   RDW 13.7 07/23/2022 1346   LYMPHSABS 2.3 06/08/2023 1616   LYMPHSABS 2.1 07/23/2022 1346   MONOABS 0.4 06/08/2023 1616   EOSABS 0.1 06/08/2023 1616   EOSABS 0.1 07/23/2022 1346   BASOSABS 0.0 06/08/2023 1616   BASOSABS 0.0 07/23/2022 1346    BMET    Component Value Date/Time   NA 138 06/09/2023 0540   NA 140 07/23/2022 1346   K 3.9 06/09/2023 0540   CL 105 06/09/2023 0540   CO2 20 (L) 06/09/2023 0540   GLUCOSE 93 06/09/2023 0540   BUN 12 06/09/2023 0540   BUN 16 07/23/2022 1346   CREATININE 1.30 (H) 06/09/2023 0540   CALCIUM 8.9 06/09/2023 0540   EGFR 64 07/23/2022 1346   GFRNONAA 57 (L) 06/09/2023 0540    IMAGING past 24 hours ECHO TEE Result Date: 06/10/2023    TRANSESOPHOGEAL ECHO REPORT   Patient Name:   Anita [name] Date of Exam: 06/10/2023 Medical Rec #:  [id number]       Height:       66.0 in Accession #:    [id number]      Weight:       227.5 lb Date of Birth:  04/17/1990       BSA:          2.112 m Patient Age:    33 years        BP:           182/139 mmHg Patient Gender: F               HR:           102 bpm. Exam Location:  Inpatient Procedure: Transesophageal Echo, Color Doppler, Cardiac Doppler and Saline             Contrast Bubble Study (Both Spectral and Color Flow Doppler were            utilized during procedure). Indications:     Stroke  History:         Patient has prior history of Echocardiogram examinations, most                  recent 03/19/2022.  Sonographer:     Hersey Lorenzo RDCS Referring Phys:  [id number] ZANE ADAMS Diagnosing Phys: Olinda Bertrand PROCEDURE: After discussion of the risks and benefits of a TEE, an informed consent was obtained from the patient. The transesophogeal probe was passed without difficulty through the esophogus of the patient. Sedation performed by different physician. Image quality was good. The patient's vital signs; including heart rate, blood pressure, and oxygen saturation;  remained stable throughout the procedure. Supplementary images were obtained from transthoracic windows as indicated to answer the clinical question. The patient developed no complications during the procedure.  IMPRESSIONS  1. Left ventricular ejection fraction, by estimation, is 60 to 65%. The left ventricle has normal function. The left ventricle has no regional wall motion abnormalities. Moderate to severe left ventricular hypertrophy.  2. Right ventricular systolic function is normal. The right ventricular size is normal.  3. No left atrial/left atrial appendage thrombus was detected. The LAA emptying velocity was 60 cm/s.  4. A small pericardial effusion is present. The pericardial effusion is circumferential. There is no evidence of cardiac tamponade.  5. The mitral valve is normal in structure. No evidence of mitral valve regurgitation. No evidence of mitral stenosis.  6. The aortic valve is normal in structure. Aortic valve regurgitation is not visualized. No aortic stenosis is present.  7. The inferior vena cava is normal in size with greater than 50% respiratory variability, suggesting right atrial pressure of 3 mmHg.  8. Agitated saline contrast bubble study was negative, with no evidence of any interatrial  shunt. Conclusion(s)/Recommendation(s): No LA/LAA thrombus identified. Negative bubble study for interatrial shunt. No intracardiac source of embolism detected on this on this transesophageal echocardiogram. FINDINGS  Left Ventricle: Left ventricular ejection fraction, by estimation, is 60 to 65%. The left ventricle has normal function. The left ventricle has no regional wall motion abnormalities. The left ventricular internal cavity size was normal in size. Moderate  to severe left ventricular hypertrophy. Right Ventricle: The right ventricular size is normal. No increase in right ventricular wall thickness. Right ventricular systolic function is normal. Left Atrium: Left atrial size was normal in size. No left atrial/left atrial appendage thrombus was detected. The LAA emptying velocity was 60 cm/s. Right Atrium: Right atrial size was normal in size. Pericardium: A small pericardial effusion is present. The pericardial effusion is circumferential. There is no evidence of cardiac tamponade. Mitral Valve: The mitral valve is normal in structure. No evidence of mitral valve regurgitation. No evidence of mitral valve stenosis. Tricuspid Valve: The tricuspid valve is normal in structure. Tricuspid valve regurgitation is not demonstrated. No evidence of tricuspid stenosis. Aortic Valve: The aortic valve is normal in structure. Aortic valve regurgitation is not visualized. No aortic stenosis is present. Aortic valve mean gradient measures 2.0 mmHg. Aortic valve peak gradient measures 3.5 mmHg. Aortic valve area, by VTI measures 3.85 cm. Pulmonic Valve: The pulmonic valve was normal in structure. Pulmonic valve regurgitation is trivial. No evidence of pulmonic stenosis. Aorta: The aortic root and ascending aorta are structurally normal, with no evidence of dilitation. Venous: The left upper pulmonary vein and left lower pulmonary vein are normal. The inferior vena cava is normal in size with greater than 50% respiratory  variability, suggesting right atrial pressure of 3 mmHg. IAS/Shunts: No atrial level shunt detected by color flow Doppler. Agitated saline contrast was given intravenously to evaluate for intracardiac shunting. Agitated saline contrast bubble study was negative, with no evidence of any interatrial shunt. Additional Comments: Spectral Doppler performed. LEFT VENTRICLE PLAX 2D LVOT diam:     2.30 cm LV SV:         53 LV SV Index:   25 LVOT Area:     4.15 cm  AORTIC VALVE AV Area (Vmax):    3.64 cm AV Area (Vmean):   3.69 cm AV Area (VTI):     3.85 cm AV Vmax:  92.90 cm/s AV Vmean:          60.800 cm/s AV VTI:            0.138 m AV Peak Grad:      3.5 mmHg AV Mean Grad:      2.0 mmHg LVOT Vmax:         81.50 cm/s LVOT Vmean:        54.000 cm/s LVOT VTI:          0.128 m LVOT/AV VTI ratio: 0.93  AORTA Ao Root diam: 3.50 cm Ao Asc diam:  3.50 cm  SHUNTS Systemic VTI:  0.13 m Systemic Diam: 2.30 cm Sunit Tolia Electronically signed by Olinda Bertrand Signature Date/Time: 06/10/2023/12:17:07 PM    Final    EP STUDY Result Date: 06/10/2023 See surgical note for result.   Vitals:   06/10/23 1028 06/10/23 1038 06/10/23 1048 06/10/23 1213  BP: (!) 137/94 (!) 135/101 (!) 144/106 (!) 170/112  Pulse: 88 91 91 78  Resp: 17 20 19    Temp: 98.4 F (36.9 C)   98 F (36.7 C)  TempSrc: Tympanic   Oral  SpO2: 94% 97% 98% 100%  Weight:      Height:         PHYSICAL EXAM General:  Alert, well-nourished, well-developed patient in no acute distress Psych:  Mood and affect appropriate for situation CV: Regular rate and rhythm on monitor Respiratory:  Regular, unlabored respirations on room air GI: Abdomen soft and nontender   NEURO:  Mental Status: AA&Ox3, patient is able to give clear and coherent history Speech/Language: speech is without dysarthria or aphasia.  Naming, repetition, fluency, and comprehension intact.  Cranial Nerves:  II: PERRL. Visual fields full.  III, IV, VI: EOMI. Eyelids elevate  symmetrically.  V: Sensation is intact to light touch and symmetrical to face.  VII: Face is asymmetrical resting and smiling VIII: hearing intact to voice. IX, X: Palate elevates symmetrically. Phonation is normal.  ZO:[id number] shrug 5/5. XII: tongue is midline without fasciculations. Motor: 5/5 strength to all muscle groups tested.  Tone: is normal and bulk is normal Sensation-now equal to light touch bilaterally. Extinction absent to light touch to DSS.   Coordination: FTN intact bilaterally, HKS: no ataxia in BLE.No drift.  Gait- deferred  Most Recent NIH 1 Premorbid modified Rankin 0  ASSESSMENT/PLAN  Ms. Anita [name] is a 33 y.o. 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 ASPECTS 10.    CTA head & neck Occluded right M3 MCA  MRI   Multiple small acute right middle cerebral artery territory infarcts DVT Studies pending 2D Echo w/ bubble not done Cardiolipin antibodies pending ANA w/ relfex pending Sickle cell screen pending TEE no PFO or clot TCD Bubble canceled LDL 98 HgbA1c 4.9 VTE prophylaxis - Lovenox No antithrombotic prior to admission, now on aspirin  81 mg daily and clopidogrel 75 mg daily for 3 weeks and then aspirin  alone. Therapy recommendations:  Pending Disposition:  pending   Hypertension Home meds:  Amlodipine , valsartan , coreg  PRN hydralazine   Blood Pressure Goal: BP less than 220/110   Hyperlipidemia LDL 98, goal < 70 Add atorvastatin  Continue statin at discharge  Tobacco Abuse Patient smokes  cigars       Ready to quit? Yes Nicotine replacement therapy provided  Other Stroke Risk Factors Obesity, Body mass index is 36.72 kg/m., BMI >/= 30 associated with increased stroke risk, recommend  weight loss, diet and exercise as appropriate   Other Active Problems Hormonal birth control use No longer on depo-provera    Hospital day # 2   Patient presented with transient left upper extremity weakness numbness and facial droop due to embolic right MCA branch infarcts of cryptogenic etiology.  Recommend continue ongoing stroke workup.  Check lower extremity venous Dopplers for DVT,  , ANA panel, anticardiolipin antibodies, sickle cell screen and TEE.  30-day heart monitor for A-fib.  Aspirin  and Plavix for 3 weeks followed by aspirin  alone and aggressive risk factor modification.  Patient has signed consent for participation in the sleep smart stroke prevention study.  She undergo NOx 3 monitor tonight to screen for study.  Discussed with Dr. Gladstone Lamer Greater than 50% time during this 35-minute visit was spent in counseling and coordination of care and discussion with patient and care team and answering questions. Ardella Beaver, MD Medical Director [facility] Pager: [phone] 06/10/2023 1:49 PM   To contact Stroke Continuity provider, please refer to [email or web address]. After hours, contact General Neurology

## 2023-06-10 NOTE — Progress Notes (Signed)
 Progress Note    Anita [name]   XBM:[id number]  DOB: [birth year]  DOA: 06/08/2023     2 PCP: Anita Dean, NP  Initial CC: Left-sided numbness and weakness, slurred speech, left facial droop  Hospital Course: [name] is a 33 year old female with PMH HTN, anemia, arthritis, gout who presented with left-sided weakness and numbness along with slurred speech and left facial droop. MRI brain showed multiple small acute right MCA infarcts.  A&P:  Acute ischemic right MCA stroke - Patient with a history of uncontrolled hypertension presented with LUE weakness, LUE numbness and left facial droop - Neuro deficits have been slowly resolving since admission - MRI brain shows multiple small acute right MCA territory infarcts - CTA head and neck shows occluded right M3 MCA - Neurology following, appreciate recs - s/p TEE on 6/5; no clot or PFO - resuming BP regimen - Follow-up hypercoag panel -LDL 98, continue statin.  A1c 4.9% -Mild cognitive impairments appreciated during SLP eval.  Slums score 24/30 - Sleep smart study being performed tonight per neurology   Hypertensive emergency Uncontrolled hypertension - Found to have SBP over 200 on admission in the setting of medication noncompliance - Presentation complicated by acute ischemic stroke likely due to uncontrolled hypertension - Continue amlodipine .  Resume Coreg  and ARB   Microcytic anemia Hx of iron deficiency anemia - Hgb low at 8.6 on admission, unknown baseline  -Iron studies low.  B12 lower limit of normal, 237 -Start on B12 replacement - Trend CBC   Systolic murmur - Patient with II/VI systolic murmur heard best at LUSB - Has a history of syncope with last TTE in March 2024 showing EF 64%, severe LVH and small pericardial effusion. - Underwent TEE.  EF 60 to 65%, no RWMA, moderate to severe LVH.  Small pericardial effusion.  No PFO or clots noted   Class II obesity Body mass index is 36.72 kg/m.  Interval  History:  Seen in room after returning from TEE today.  No questions or concerns.  She was amenable for staying in the hospital for sleep study tonight per neurology.  Blood pressure still elevated and home meds being further resumed today.   Old records reviewed in assessment of this patient  Antimicrobials:   DVT prophylaxis:  enoxaparin (LOVENOX) injection 40 mg Start: 06/08/23 2145   Code Status:   Code Status: Full Code  Mobility Assessment (Last 72 Hours)     Mobility Assessment     Row Name 06/10/23 1444 06/09/23 2000 06/09/23 1431 06/09/23 1100 06/09/23 0800   Does patient have an order for bedrest or is patient medically unstable -- No - Continue assessment No - Continue assessment -- --   What is the highest level of mobility based on the progressive mobility assessment? Level 5 (Walks with assist in room/hall) - Balance while stepping forward/back and can walk in room with assist - Complete Level 6 (Walks independently in room and hall) - Balance while walking in room without assist - Complete Level 6 (Walks independently in room and hall) - Balance while walking in room without assist - Complete Level 5 (Walks with assist in room/hall) - Balance while stepping forward/back and can walk in room with assist - Complete Level 4 (Walks with assist in room) - Balance while marching in place and cannot step forward and back - Complete            Barriers to discharge: None Disposition Plan: Home HH orders placed: Home health  PT and outpatient OT Status is: Inpatient  Objective: Blood pressure (!) 142/93, pulse 79, temperature 98.3 F (36.8 C), temperature source Oral, resp. rate 19, height 5\' 6"  (1.676 m), weight 103.2 kg, SpO2 100%.  Examination:  Physical Exam Constitutional:      Appearance: Normal appearance.  HENT:     Head: Normocephalic and atraumatic.     Mouth/Throat:     Mouth: Mucous membranes are moist.  Eyes:     Extraocular Movements: Extraocular  movements intact.  Cardiovascular:     Rate and Rhythm: Normal rate and regular rhythm.  Pulmonary:     Effort: Pulmonary effort is normal. No respiratory distress.     Breath sounds: Normal breath sounds. No wheezing.  Abdominal:     General: Bowel sounds are normal. There is no distension.     Palpations: Abdomen is soft.     Tenderness: There is no abdominal tenderness.  Musculoskeletal:        General: Normal range of motion.     Cervical back: Normal range of motion and neck supple.  Skin:    General: Skin is warm and dry.  Neurological:     General: No focal deficit present.     Mental Status: She is alert.  Psychiatric:        Mood and Affect: Mood normal.      Consultants:  Neurology  Procedures:  06/10/2023: TEE  Data Reviewed: Results for orders placed or performed during the hospital encounter of 06/08/23 (from the past 24 hours)  Hemoglobin A1c     Status: None   Collection Time: 06/10/23  6:21 AM  Result Value Ref Range   Hgb A1c MFr Bld 4.9 4.8 - 5.6 %   Mean Plasma Glucose 93.93 mg/dL    I have reviewed pertinent nursing notes, vitals, labs, and images as necessary. I have ordered labwork to follow up on as indicated.  I have reviewed the last notes from staff over past 24 hours. I have discussed patient's care plan and test results with nursing staff, CM/SW, and other staff as appropriate.  Time spent: Greater than 50% of the 55 minute visit was spent in counseling/coordination of care for the patient as laid out in the A&P.   LOS: 2 days   Faith Homes, MD [facility] 06/10/2023, 3:51 PM

## 2023-06-11 ENCOUNTER — Inpatient Hospital Stay (HOSPITAL_COMMUNITY)

## 2023-06-11 DIAGNOSIS — F17200 Nicotine dependence, unspecified, uncomplicated: Secondary | ICD-10-CM | POA: Diagnosis not present

## 2023-06-11 DIAGNOSIS — D509 Iron deficiency anemia, unspecified: Secondary | ICD-10-CM | POA: Diagnosis not present

## 2023-06-11 DIAGNOSIS — R29701 NIHSS score 1: Secondary | ICD-10-CM | POA: Diagnosis not present

## 2023-06-11 DIAGNOSIS — I639 Cerebral infarction, unspecified: Secondary | ICD-10-CM | POA: Diagnosis not present

## 2023-06-11 DIAGNOSIS — I161 Hypertensive emergency: Secondary | ICD-10-CM | POA: Diagnosis not present

## 2023-06-11 DIAGNOSIS — I10 Essential (primary) hypertension: Secondary | ICD-10-CM | POA: Diagnosis not present

## 2023-06-11 DIAGNOSIS — I63511 Cerebral infarction due to unspecified occlusion or stenosis of right middle cerebral artery: Secondary | ICD-10-CM | POA: Diagnosis not present

## 2023-06-11 DIAGNOSIS — E785 Hyperlipidemia, unspecified: Secondary | ICD-10-CM | POA: Diagnosis not present

## 2023-06-11 LAB — CARDIOLIPIN ANTIBODIES, IGG, IGM, IGA
Anticardiolipin IgA: <9 U/mL (ref 0–11)
Anticardiolipin IgG: <9 GPL U/mL (ref 0–14)
Anticardiolipin IgM: 15 [MPL'U]/mL — ABNORMAL HIGH (ref 0–12)

## 2023-06-11 MED ORDER — IRBESARTAN 150 MG PO TABS
75.0000 mg | ORAL_TABLET | Freq: Once | ORAL | Status: AC
  Administered 2023-06-11: 75 mg via ORAL
  Filled 2023-06-11: qty 1

## 2023-06-11 MED ORDER — IRBESARTAN 150 MG PO TABS
150.0000 mg | ORAL_TABLET | Freq: Every day | ORAL | Status: DC
  Administered 2023-06-12: 150 mg via ORAL
  Filled 2023-06-11: qty 1

## 2023-06-11 NOTE — Progress Notes (Signed)
    Durable Medical Equipment  (From admission, onward)           Start     Ordered   06/11/23 1132  For home use only DME Walker rolling  Once       Question Answer Comment  Walker: With 5 Inch Wheels   Patient needs a walker to treat with the following condition Stroke (HCC)      06/11/23 1132   06/09/23 1219  For home use only DME 3 n 1  Once        06/09/23 1218             BSC:  The patient is confined to one level of the home environment and there is no toilet on the that level of the home.

## 2023-06-11 NOTE — Progress Notes (Signed)
 BLE venous duplex has been completed.   Results can be found under chart review under CV PROC. 06/11/2023 9:27 AM Cortlandt Capuano RVT, RDMS

## 2023-06-11 NOTE — Progress Notes (Signed)
 Progress Note    Anita [name]   ZOX:[id number]  DOB: May 17, 1990  DOA: 06/08/2023     3 PCP: Collins Dean, NP  Initial CC: Left-sided numbness and weakness, slurred speech, left facial droop  Hospital Course: Ms. Colvard is a 33 year old female with PMH HTN, anemia, arthritis, gout who presented with left-sided weakness and numbness along with slurred speech and left facial droop. MRI brain showed multiple small acute right MCA infarcts.  A&P:  Acute ischemic right MCA stroke - Patient with a history of uncontrolled hypertension presented with LUE weakness, LUE numbness and left facial droop - Neuro deficits have been slowly resolving since admission - MRI brain shows multiple small acute right MCA territory infarcts - CTA head and neck shows occluded right M3 MCA - Neurology following, appreciate recs - s/p TEE on 6/5; no clot or PFO - resuming BP regimen - Cardiolipin IgM mildly elevated; recommended to be repeated in 2 months per neurology -LDL 98, continue statin.  A1c 4.9% - DAPT for 3 weeks then asa monotherapy per neuro -Mild cognitive impairments appreciated during SLP eval.  Slums score 24/30 - Sleep study positive, patient amenable with CPAP titration tonight   Hypertensive emergency Uncontrolled hypertension - Found to have SBP over 200 on admission in the setting of medication noncompliance - Presentation complicated by acute ischemic stroke likely due to uncontrolled hypertension - Continue amlodipine .  Resumed Coreg  and ARB - increase ARB 6/6 for further control    Microcytic anemia Hx of iron deficiency anemia - Hgb low at 8.6 on admission, unknown baseline  -Iron studies low.  B12 lower limit of normal, 237 -Start on B12 replacement - Trend CBC   Systolic murmur - Patient with II/VI systolic murmur heard best at LUSB - Has a history of syncope with last TTE in March 2024 showing EF 64%, severe LVH and small pericardial effusion. - Underwent TEE.  EF  60 to 65%, no RWMA, moderate to severe LVH.  Small pericardial effusion.  No PFO or clots noted   Class II obesity Body mass index is 36.72 kg/m.  Interval History:  Sleep study was reported to be positive overnight.  She is amenable for staying tonight for CPAP titration.  Blood pressure still above goal and further adjusting her regimen and discussed this with her bedside this morning as well.  No further neurodeficits and prior deficits on admission remain resolved.   Old records reviewed in assessment of this patient  Antimicrobials:   DVT prophylaxis:  enoxaparin (LOVENOX) injection 40 mg Start: 06/08/23 2145   Code Status:   Code Status: Full Code  Mobility Assessment (Last 72 Hours)     Mobility Assessment     Row Name 06/11/23 0740 06/10/23 2100 06/10/23 1811 06/10/23 1444 06/10/23 0821   Does patient have an order for bedrest or is patient medically unstable No - Continue assessment No - Continue assessment -- -- No - Continue assessment   What is the highest level of mobility based on the progressive mobility assessment? Level 5 (Walks with assist in room/hall) - Balance while stepping forward/back and can walk in room with assist - Complete Level 5 (Walks with assist in room/hall) - Balance while stepping forward/back and can walk in room with assist - Complete Level 5 (Walks with assist in room/hall) - Balance while stepping forward/back and can walk in room with assist - Complete Level 5 (Walks with assist in room/hall) - Balance while stepping forward/back and can walk in room  with assist - Complete Level 5 (Walks with assist in room/hall) - Balance while stepping forward/back and can walk in room with assist - Complete    Row Name 06/10/23 0754 06/09/23 2000 06/09/23 1431 06/09/23 1100 06/09/23 0800   Does patient have an order for bedrest or is patient medically unstable No - Continue assessment No - Continue assessment No - Continue assessment -- --   What is the  highest level of mobility based on the progressive mobility assessment? Level 5 (Walks with assist in room/hall) - Balance while stepping forward/back and can walk in room with assist - Complete Level 6 (Walks independently in room and hall) - Balance while walking in room without assist - Complete Level 6 (Walks independently in room and hall) - Balance while walking in room without assist - Complete Level 5 (Walks with assist in room/hall) - Balance while stepping forward/back and can walk in room with assist - Complete Level 4 (Walks with assist in room) - Balance while marching in place and cannot step forward and back - Complete            Barriers to discharge: None Disposition Plan: Home HH orders placed: Outpt PT and outpatient OT Status is: Inpatient  Objective: Blood pressure (!) 146/99, pulse 79, temperature 98.3 F (36.8 C), temperature source Oral, resp. rate 17, height 5\' 6"  (1.676 m), weight 103.2 kg, SpO2 96%.  Examination:  Physical Exam Constitutional:      Appearance: Normal appearance.  HENT:     Head: Normocephalic and atraumatic.     Mouth/Throat:     Mouth: Mucous membranes are moist.  Eyes:     Extraocular Movements: Extraocular movements intact.  Cardiovascular:     Rate and Rhythm: Normal rate and regular rhythm.  Pulmonary:     Effort: Pulmonary effort is normal. No respiratory distress.     Breath sounds: Normal breath sounds. No wheezing.  Abdominal:     General: Bowel sounds are normal. There is no distension.     Palpations: Abdomen is soft.     Tenderness: There is no abdominal tenderness.  Musculoskeletal:        General: Normal range of motion.     Cervical back: Normal range of motion and neck supple.  Skin:    General: Skin is warm and dry.  Neurological:     General: No focal deficit present.     Mental Status: She is alert.  Psychiatric:        Mood and Affect: Mood normal.      Consultants:  Neurology  Procedures:  06/10/2023:  TEE  Data Reviewed: No results found for this or any previous visit (from the past 24 hours).   I have reviewed pertinent nursing notes, vitals, labs, and images as necessary. I have ordered labwork to follow up on as indicated.  I have reviewed the last notes from staff over past 24 hours. I have discussed patient's care plan and test results with nursing staff, CM/SW, and other staff as appropriate.  Time spent: Greater than 50% of the 55 minute visit was spent in counseling/coordination of care for the patient as laid out in the A&P.   LOS: 3 days   Faith Homes, MD [facility] 06/11/2023, 3:23 PM

## 2023-06-11 NOTE — Plan of Care (Signed)
   Problem: Education: Goal: Knowledge of disease or condition will improve Outcome: Progressing Goal: Knowledge of secondary prevention will improve (MUST DOCUMENT ALL) Outcome: Progressing

## 2023-06-11 NOTE — TOC CAGE-AID Note (Signed)
 Transition of Care [facility]) - CAGE-AID Screening   Patient Details  Name: Anita [name] MRN: [id number] Date of Birth: May 30, 1990  [facility] of [facility]) CM/SW Contact:    Jhordan Mckibben E Braedan Meuth, LCSW Phone Number: 06/11/2023, 9:18 AM   Clinical Narrative:    CAGE-AID Screening:    Have You Ever Felt You Ought to Cut Down on Your Drinking or Drug Use?: No Have People Annoyed You By [employer] Your Drinking Or Drug Use?: No Have You Felt Bad Or Guilty About Your Drinking Or Drug Use?: No Have You Ever Had a Drink or Used Drugs First Thing In The Morning to Steady Your Nerves or to Get Rid of a Hangover?: No CAGE-AID Score: 0  Substance Abuse Education Offered: No

## 2023-06-11 NOTE — Progress Notes (Signed)
 STROKE TEAM PROGRESS NOTE   INTERIM HISTORY/SUBJECTIVE Patient lying comfortably in bed.  She did test positive for obstructive sleep apnea overnight NOx 3 monitor.  She is willing to stay tonight and try CPAP.  Neurological exam is unchanged.  No new complaints.  OBJECTIVE  CBC    Component Value Date/Time   WBC 9.8 06/09/2023 0807   RBC 4.62 06/09/2023 0807   HGB 8.8 (L) 06/09/2023 0807   HGB 13.9 07/23/2022 1346   HCT 31.8 (L) 06/09/2023 0807   HCT 41.9 07/23/2022 1346   PLT 165 06/09/2023 0807   PLT 173 07/23/2022 1346   MCV 68.8 (L) 06/09/2023 0807   MCV 94 07/23/2022 1346   MCH 19.0 (L) 06/09/2023 0807   MCHC 27.7 (L) 06/09/2023 0807   RDW 19.9 (H) 06/09/2023 0807   RDW 13.7 07/23/2022 1346   LYMPHSABS 2.3 06/08/2023 1616   LYMPHSABS 2.1 07/23/2022 1346   MONOABS 0.4 06/08/2023 1616   EOSABS 0.1 06/08/2023 1616   EOSABS 0.1 07/23/2022 1346   BASOSABS 0.0 06/08/2023 1616   BASOSABS 0.0 07/23/2022 1346    BMET    Component Value Date/Time   NA 138 06/09/2023 0540   NA 140 07/23/2022 1346   K 3.9 06/09/2023 0540   CL 105 06/09/2023 0540   CO2 20 (L) 06/09/2023 0540   GLUCOSE 93 06/09/2023 0540   BUN 12 06/09/2023 0540   BUN 16 07/23/2022 1346   CREATININE 1.30 (H) 06/09/2023 0540   CALCIUM 8.9 06/09/2023 0540   EGFR 64 07/23/2022 1346   GFRNONAA 57 (L) 06/09/2023 0540    IMAGING past 24 hours VAS US  LOWER EXTREMITY VENOUS (DVT) Result Date: 06/11/2023  Lower Venous DVT Study Patient Name:  Anita [name]  Date of Exam:   06/11/2023 Medical Rec #: [id number]        Accession #:    [id number] Date of Birth: June 16, 1990        Patient Gender: F Patient Age:   32 years Exam Location:  [facility] Procedure:      VAS US  LOWER EXTREMITY VENOUS (DVT) Referring Phys: Adeleine Pask --------------------------------------------------------------------------------  Indications: Stroke.  Comparison Study: No previous exams Performing Technologist: Jody Hill RVT, RDMS   Examination Guidelines: A complete evaluation includes B-mode imaging, spectral Doppler, color Doppler, and power Doppler as needed of all accessible portions of each vessel. Bilateral testing is considered an integral part of a complete examination. Limited examinations for reoccurring indications may be performed as noted. The reflux portion of the exam is performed with the patient in reverse Trendelenburg.  +---------+---------------+---------+-----------+----------+--------------+ RIGHT    CompressibilityPhasicitySpontaneityPropertiesThrombus Aging +---------+---------------+---------+-----------+----------+--------------+ CFV      Full           Yes      Yes                                 +---------+---------------+---------+-----------+----------+--------------+ SFJ      Full                                                        +---------+---------------+---------+-----------+----------+--------------+ FV Prox  Full           Yes      Yes                                 +---------+---------------+---------+-----------+----------+--------------+  FV Mid   Full           Yes      Yes                                 +---------+---------------+---------+-----------+----------+--------------+ FV DistalFull           Yes      Yes                                 +---------+---------------+---------+-----------+----------+--------------+ PFV      Full                                                        +---------+---------------+---------+-----------+----------+--------------+ POP      Full           Yes      Yes                                 +---------+---------------+---------+-----------+----------+--------------+ PTV      Full                                                        +---------+---------------+---------+-----------+----------+--------------+ PERO     Full                                                         +---------+---------------+---------+-----------+----------+--------------+   +---------+---------------+---------+-----------+----------+--------------+ LEFT     CompressibilityPhasicitySpontaneityPropertiesThrombus Aging +---------+---------------+---------+-----------+----------+--------------+ CFV      Full           Yes      Yes                                 +---------+---------------+---------+-----------+----------+--------------+ SFJ      Full                                                        +---------+---------------+---------+-----------+----------+--------------+ FV Prox  Full           Yes      Yes                                 +---------+---------------+---------+-----------+----------+--------------+ FV Mid   Full           Yes      Yes                                 +---------+---------------+---------+-----------+----------+--------------+ FV DistalFull  Yes      Yes                                 +---------+---------------+---------+-----------+----------+--------------+ PFV      Full                                                        +---------+---------------+---------+-----------+----------+--------------+ POP      Full           Yes      Yes                                 +---------+---------------+---------+-----------+----------+--------------+ PTV      Full                                                        +---------+---------------+---------+-----------+----------+--------------+ PERO     Full                                                        +---------+---------------+---------+-----------+----------+--------------+     Summary: BILATERAL: - No evidence of deep vein thrombosis seen in the lower extremities, bilaterally. -No evidence of popliteal cyst, bilaterally.   *See table(s) above for measurements and observations.    Preliminary     Vitals:   06/11/23 0055 06/11/23 0610  06/11/23 0816 06/11/23 1251  BP: (!) 131/90 (!) 157/109 (!) 148/95 (!) 146/99  Pulse: 78 77 81 79  Resp: 18 15 18 17   Temp: 98.6 F (37 C) 98 F (36.7 C) 97.9 F (36.6 C) 98.3 F (36.8 C)  TempSrc: Oral Oral Oral Oral  SpO2: 98% 99% 100% 96%  Weight:      Height:         PHYSICAL EXAM General:  Alert, well-nourished, well-developed patient in no acute distress Psych:  Mood and affect appropriate for situation CV: Regular rate and rhythm on monitor Respiratory:  Regular, unlabored respirations on room air GI: Abdomen soft and nontender   NEURO:  Mental Status: AA&Ox3, patient is able to give clear and coherent history Speech/Language: speech is without dysarthria or aphasia.  Naming, repetition, fluency, and comprehension intact.  Cranial Nerves:  II: PERRL. Visual fields full.  III, IV, VI: EOMI. Eyelids elevate symmetrically.  V: Sensation is intact to light touch and symmetrical to face.  VII: Face is asymmetrical resting and smiling VIII: hearing intact to voice. IX, X: Palate elevates symmetrically. Phonation is normal.  VO:[id number] shrug 5/5. XII: tongue is midline without fasciculations. Motor: 5/5 strength to all muscle groups tested.  Tone: is normal and bulk is normal Sensation-now equal to light touch bilaterally. Extinction absent to light touch to DSS.   Coordination: FTN intact bilaterally, HKS: no ataxia in BLE.No drift.  Gait- deferred  Most Recent NIH 1 Premorbid modified Rankin 0  ASSESSMENT/PLAN  Anita [name] is a 33  y.o. 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 ASPECTS 10.    CTA head & neck Occluded right M3 MCA  MRI   Multiple small acute right middle cerebral artery territory  infarcts Lower extremity venous Doppler negative  2D Echo w/ bubble not done Cardiolipin antibodies minimally elevated IgM antibody.  Recommend repeating 2 months  ANA w/ relfex negative Sickle cell screen negative TEE no PFO or clot TCD Bubble canceled LDL 98 HgbA1c 4.9 VTE prophylaxis - Lovenox No antithrombotic prior to admission, now on aspirin  81 mg daily and clopidogrel 75 mg daily for 3 weeks and then aspirin  alone. Therapy recommendations:  Pending Disposition:  pending   Hypertension Home meds:  Amlodipine , valsartan , coreg  PRN hydralazine   Blood Pressure Goal: BP less than 220/110   Hyperlipidemia LDL 98, goal < 70 Add atorvastatin  Continue statin at discharge  Tobacco Abuse Patient smokes cigars       Ready to quit? Yes Nicotine replacement therapy provided  Other Stroke Risk Factors Obesity, Body mass index is 36.72 kg/m., BMI >/= 30 associated with increased stroke risk, recommend weight loss, diet and exercise as appropriate   Other Active Problems Hormonal birth control use No longer on depo-provera    Hospital day # 3 Patient is doing well.  She did test positive on overnight NOx 3 monitor for obstructive sleep apnea and will spend the night in the hospital and try CPAP mask tolerability.  Lab work for hypercoagulability and vasculitis is mostly negative.  Will need repeat anticardiolipin antibody titer in 2 to 3 months as IgM is borderline positive.  Continue aspirin  and Plavix for 3 weeks followed by aspirin  alone and aggressive risk factor modification. Follow-up as an outpatient with stroke clinic in 2 months.  Continue participation in the sleep smart stroke prevention study Greater than 50% time during this 35-minute visit was spent in counseling and coordination of care and discussion with patient and care team and answering questions. Ardella Beaver, MD Medical Director [facility] Pager: [phone] 06/11/2023 2:21 PM   To contact  Stroke Continuity provider, please refer to [email or web address]. After hours, contact General Neurology

## 2023-06-11 NOTE — Plan of Care (Signed)

## 2023-06-12 ENCOUNTER — Other Ambulatory Visit (HOSPITAL_COMMUNITY): Payer: Self-pay

## 2023-06-12 DIAGNOSIS — I10 Essential (primary) hypertension: Secondary | ICD-10-CM | POA: Diagnosis not present

## 2023-06-12 DIAGNOSIS — I161 Hypertensive emergency: Secondary | ICD-10-CM | POA: Diagnosis not present

## 2023-06-12 DIAGNOSIS — I63511 Cerebral infarction due to unspecified occlusion or stenosis of right middle cerebral artery: Secondary | ICD-10-CM | POA: Diagnosis not present

## 2023-06-12 DIAGNOSIS — D509 Iron deficiency anemia, unspecified: Secondary | ICD-10-CM | POA: Diagnosis not present

## 2023-06-12 LAB — RAPID URINE DRUG SCREEN, HOSP PERFORMED
Amphetamines: NOT DETECTED
Barbiturates: NOT DETECTED
Benzodiazepines: NOT DETECTED
Cocaine: NOT DETECTED
Opiates: NOT DETECTED
Tetrahydrocannabinol: NOT DETECTED

## 2023-06-12 MED ORDER — CLOPIDOGREL BISULFATE 75 MG PO TABS
75.0000 mg | ORAL_TABLET | Freq: Every day | ORAL | 0 refills | Status: AC
Start: 2023-06-12 — End: 2023-07-01
  Filled 2023-06-12: qty 19, 19d supply, fill #0

## 2023-06-12 MED ORDER — VITAMIN B-12 1000 MCG PO TABS: 1000.0000 ug | ORAL_TABLET | Freq: Every day | ORAL | Status: DC

## 2023-06-12 MED ORDER — ASPIRIN 81 MG PO TBEC
81.0000 mg | DELAYED_RELEASE_TABLET | Freq: Every day | ORAL | Status: AC
Start: 2023-06-12 — End: ?

## 2023-06-12 MED ORDER — CARVEDILOL 12.5 MG PO TABS
12.5000 mg | ORAL_TABLET | Freq: Two times a day (BID) | ORAL | 3 refills | Status: AC
  Filled 2023-06-12: qty 60, 30d supply, fill #0
  Filled 2023-08-31: qty 60, 30d supply, fill #1
  Filled 2024-01-12: qty 60, 30d supply, fill #2

## 2023-06-12 MED ORDER — ATORVASTATIN CALCIUM 40 MG PO TABS
40.0000 mg | ORAL_TABLET | Freq: Every day | ORAL | 3 refills | Status: AC
  Filled 2023-06-12: qty 90, 90d supply, fill #0
  Filled 2024-01-12: qty 90, 90d supply, fill #1

## 2023-06-12 MED ORDER — VALSARTAN 160 MG PO TABS
160.0000 mg | ORAL_TABLET | Freq: Every day | ORAL | 3 refills | Status: AC
  Filled 2023-06-12: qty 90, 90d supply, fill #0
  Filled 2023-08-31 – 2024-01-12 (×2): qty 90, 90d supply, fill #1

## 2023-06-12 MED ORDER — AMLODIPINE BESYLATE 10 MG PO TABS
10.0000 mg | ORAL_TABLET | Freq: Every day | ORAL | 3 refills | Status: AC
  Filled 2023-06-12: qty 90, 90d supply, fill #0
  Filled 2024-01-12: qty 90, 90d supply, fill #1

## 2023-06-12 NOTE — Discharge Summary (Signed)
 Physician Discharge Summary   Anita [name]:[id number] DOB: [birth year] DOA: 06/08/2023  PCP: Collins Dean, NP  Admit date: 06/08/2023 Discharge date: 06/12/2023  Admitted From: Home Disposition:  Home Discharging physician: Faith Homes, MD Barriers to discharge: none  Recommendations at discharge: Repeat BP at follow up; may need further regimen adjustment    Discharge Condition: stable CODE STATUS: Full Diet recommendation:  Diet Orders (From admission, onward)     Start     Ordered   06/12/23 0000  Diet general        06/12/23 1112   06/10/23 1230  Diet regular Room service appropriate? Yes; Fluid consistency: Thin  Diet effective now       Question Answer Comment  Room service appropriate? Yes   Fluid consistency: Thin      06/10/23 1230            Hospital Course: Anita [name] is a 33 year old female with PMH HTN, anemia, arthritis, gout who presented with left-sided weakness and numbness along with slurred speech and left facial droop. MRI brain showed multiple small acute right MCA infarcts.  A&P:  Acute ischemic right MCA stroke - Patient with a history of uncontrolled hypertension presented with LUE weakness, LUE numbness and left facial droop - Neuro deficits have been slowly resolving since admission - MRI brain shows multiple small acute right MCA territory infarcts - CTA head and neck shows occluded right M3 MCA - Neurology following, appreciate recs - s/p TEE on 6/5; no clot or PFO - resuming BP regimen - Cardiolipin IgM mildly elevated; recommended to be repeated in 2 months per neurology -LDL 98, continue statin.  A1c 4.9% - DAPT for 3 weeks then asa monotherapy per neuro -Mild cognitive impairments appreciated during SLP eval.  Slums score 24/30 - Sleep study positive, patient amenable with CPAP titration; machine arranged for discharge per neurology   Hypertensive emergency-resolved Uncontrolled hypertension - Found to have SBP over  200 on admission in the setting of medication noncompliance - Presentation complicated by acute ischemic stroke likely due to uncontrolled hypertension - Continue amlodipine .  Resumed Coreg  and ARB - increase ARB 6/6 for further control  -Pressure controlled on current regimen at discharge which was continued   Microcytic anemia Hx of iron deficiency anemia - Hgb low at 8.6 on admission, unknown baseline  -Iron studies low.  B12 lower limit of normal, 237 -Started on B12 replacement   Systolic murmur - Patient with II/VI systolic murmur heard best at LUSB - Has a history of syncope with last TTE in March 2024 showing EF 64%, severe LVH and small pericardial effusion. - Underwent TEE.  EF 60 to 65%, no RWMA, moderate to severe LVH.  Small pericardial effusion.  No PFO or clots noted   Class II obesity Body mass index is 36.72 kg/m.   The patient's acute and chronic medical conditions were treated accordingly. On day of discharge, patient was felt deemed stable for discharge. Patient/family member advised to call PCP or come back to ER if needed.   Principal Diagnosis: Acute ischemic right MCA stroke [facility])  Discharge Diagnoses: Active Hospital Problems   Diagnosis Date Noted   Acute ischemic right MCA stroke (HCC) 06/08/2023    Priority: 1.   Hypertensive emergency 03/19/2022    Priority: 2.   Microcytic anemia 03/19/2022    Priority: 4.   Obesity (BMI 30-39.9) 06/09/2023   Uncontrolled hypertension 07/07/2022    Resolved Hospital Problems  No resolved problems to display.  Discharge Instructions     Ambulatory referral to Occupational Therapy   Complete by: As directed    Ambulatory referral to Physical Therapy   Complete by: As directed    Ambulatory referral to Speech Therapy   Complete by: As directed    Diet general   Complete by: As directed    Increase activity slowly   Complete by: As directed       Allergies as of 06/12/2023   No Known Allergies       Medication List     STOP taking these medications    ibuprofen  800 MG tablet Commonly known as: ADVIL    medroxyPROGESTERone  150 MG/ML injection Commonly known as: DEPO-PROVERA    metroNIDAZOLE  500 MG tablet Commonly known as: FLAGYL        TAKE these medications    amLODipine  10 MG tablet Commonly known as: NORVASC  Take 1 tablet (10 mg total) by mouth daily. What changed:  medication strength See the new instructions.   aspirin  EC 81 MG tablet Take 1 tablet (81 mg total) by mouth daily. Swallow whole.   atorvastatin  40 MG tablet Commonly known as: LIPITOR Take 1 tablet (40 mg total) by mouth daily.   Blood Pressure Monitor Devi Please provide patient with insurance approved blood pressure monitor   carvedilol  12.5 MG tablet Commonly known as: COREG  Take 1 tablet (12.5 mg total) by mouth 2 (two) times daily with a meal.   clopidogrel  75 MG tablet Commonly known as: PLAVIX  Take 1 tablet (75 mg total) by mouth daily for 19 days.   cyanocobalamin  1000 MCG tablet Commonly known as: VITAMIN B12 Take 1 tablet (1,000 mcg total) by mouth daily.   valsartan  160 MG tablet Commonly known as: DIOVAN  Take 1 tablet (160 mg total) by mouth daily. What changed:  medication strength how much to take               Durable Medical Equipment  (From admission, onward)           Start     Ordered   06/11/23 1132  For home use only DME Walker rolling  Once       Question Answer Comment  Walker: With 5 Inch Wheels   Patient needs a walker to treat with the following condition Stroke (HCC)      06/11/23 1132   06/09/23 1219  For home use only DME 3 n 1  Once        06/09/23 1218            Follow-up Information     [facility] at [facility]. Schedule an appointment as soon as possible for a visit.   Specialty: Rehabilitation Contact information: [address] [place]  [address] [phone]         Tretha Fu, MD Follow up on 06/16/2023.   Specialty: Internal Medicine Why: our appointment is at 10:00 am. please arrive early and bring a picture ID, current medications and insurance card Contact information: [address] [place] [address] [phone]                No Known Allergies  Consultations: Neurology  Procedures:   Discharge Exam: BP (!) 128/91   Pulse 72   Temp 98.9 F (37.2 C) (Oral)   Resp 16   Ht 5\' 6"  (1.676 m)   Wt 103.2 kg   SpO2 100%   BMI 36.72 kg/m  Physical Exam Constitutional:  Appearance: Normal appearance.  HENT:     Head: Normocephalic and atraumatic.     Mouth/Throat:     Mouth: Mucous membranes are moist.  Eyes:     Extraocular Movements: Extraocular movements intact.  Cardiovascular:     Rate and Rhythm: Normal rate and regular rhythm.  Pulmonary:     Effort: Pulmonary effort is normal. No respiratory distress.     Breath sounds: Normal breath sounds. No wheezing.  Abdominal:     General: Bowel sounds are normal. There is no distension.     Palpations: Abdomen is soft.     Tenderness: There is no abdominal tenderness.  Musculoskeletal:        General: Normal range of motion.     Cervical back: Normal range of motion and neck supple.  Skin:    General: Skin is warm and dry.  Neurological:     General: No focal deficit present.     Mental Status: She is alert.  Psychiatric:        Mood and Affect: Mood normal.      The results of significant diagnostics from this hospitalization (including imaging, microbiology, ancillary and laboratory) are listed below for reference.   Microbiology: No results found for this or any previous visit (from the past 240 hours).   Labs: BNP (last 3 results) No results for input(s): "BNP" in the last 8760 hours. Basic Metabolic Panel: Recent Labs  Lab 06/08/23 1616 06/08/23 1621 06/09/23 0540  NA 138 139 138  K 3.8 3.7 3.9  CL 108 104 105  CO2 23  --   20*  GLUCOSE 111* 109* 93  BUN 13 13 12   CREATININE 1.09* 1.10* 1.30*  CALCIUM  8.8*  --  8.9   Liver Function Tests: Recent Labs  Lab 06/08/23 1616  AST 50*  ALT 21  ALKPHOS 60  BILITOT 0.6  PROT 6.4*  ALBUMIN 3.3*   No results for input(s): "LIPASE", "AMYLASE" in the last 168 hours. No results for input(s): "AMMONIA" in the last 168 hours. CBC: Recent Labs  Lab 06/08/23 1616 06/08/23 1621 06/09/23 0807  WBC 7.5  --  9.8  NEUTROABS 4.7  --   --   HGB 8.6* 10.9* 8.8*  HCT 30.8* 32.0* 31.8*  MCV 68.4*  --  68.8*  PLT 155  --  165   Cardiac Enzymes: No results for input(s): "CKTOTAL", "CKMB", "CKMBINDEX", "TROPONINI" in the last 168 hours. BNP: Invalid input(s): "POCBNP" CBG: Recent Labs  Lab 06/08/23 1611  GLUCAP 123*   D-Dimer No results for input(s): "DDIMER" in the last 72 hours. Hgb A1c Recent Labs    06/10/23 0621  HGBA1C 4.9   Lipid Profile No results for input(s): "CHOL", "HDL", "LDLCALC", "TRIG", "CHOLHDL", "LDLDIRECT" in the last 72 hours. Thyroid  function studies No results for input(s): "TSH", "T4TOTAL", "T3FREE", "THYROIDAB" in the last 72 hours.  Invalid input(s): "FREET3" Anemia work up No results for input(s): "VITAMINB12", "FOLATE", "FERRITIN", "TIBC", "IRON", "RETICCTPCT" in the last 72 hours. Urinalysis    Component Value Date/Time   COLORURINE YELLOW 03/19/2022 0108   APPEARANCEUR CLEAR 03/19/2022 0108   LABSPEC 1.014 03/19/2022 0108   PHURINE 7.0 03/19/2022 0108   GLUCOSEU NEGATIVE 03/19/2022 0108   HGBUR NEGATIVE 03/19/2022 0108   BILIRUBINUR NEGATIVE 03/19/2022 0108   KETONESUR NEGATIVE 03/19/2022 0108   PROTEINUR 100 (A) 03/19/2022 0108   UROBILINOGEN 2.0 (H) 07/02/2015 1534   NITRITE NEGATIVE 03/19/2022 0108   LEUKOCYTESUR SMALL (A) 03/19/2022 0108   Sepsis Labs  Recent Labs  Lab 06/08/23 1616 06/09/23 0807  WBC 7.5 9.8   Microbiology No results found for this or any previous visit (from the past 240  hours).  Procedures/Studies: VAS US  LOWER EXTREMITY VENOUS (DVT) Result Date: 06/11/2023  Lower Venous DVT Study Patient Name:  Anita [name]  Date of Exam:   06/11/2023 Medical Rec #: [id number]        Accession #:    [id number] Date of Birth: 08-26-1990        Patient Gender: F Patient Age:   32 years Exam Location:  [facility] Procedure:      VAS US  LOWER EXTREMITY VENOUS (DVT) Referring Phys: PRAMOD SETHI --------------------------------------------------------------------------------  Indications: Stroke.  Comparison Study: No previous exams Performing Technologist: Jody Hill RVT, RDMS  Examination Guidelines: A complete evaluation includes B-mode imaging, spectral Doppler, color Doppler, and power Doppler as needed of all accessible portions of each vessel. Bilateral testing is considered an integral part of a complete examination. Limited examinations for reoccurring indications may be performed as noted. The reflux portion of the exam is performed with the patient in reverse Trendelenburg.  +---------+---------------+---------+-----------+----------+--------------+ RIGHT    CompressibilityPhasicitySpontaneityPropertiesThrombus Aging +---------+---------------+---------+-----------+----------+--------------+ CFV      Full           Yes      Yes                                 +---------+---------------+---------+-----------+----------+--------------+ SFJ      Full                                                        +---------+---------------+---------+-----------+----------+--------------+ FV Prox  Full           Yes      Yes                                 +---------+---------------+---------+-----------+----------+--------------+ FV Mid   Full           Yes      Yes                                 +---------+---------------+---------+-----------+----------+--------------+ FV DistalFull           Yes      Yes                                  +---------+---------------+---------+-----------+----------+--------------+ PFV      Full                                                        +---------+---------------+---------+-----------+----------+--------------+ POP      Full           Yes      Yes                                 +---------+---------------+---------+-----------+----------+--------------+  PTV      Full                                                        +---------+---------------+---------+-----------+----------+--------------+ PERO     Full                                                        +---------+---------------+---------+-----------+----------+--------------+   +---------+---------------+---------+-----------+----------+--------------+ LEFT     CompressibilityPhasicitySpontaneityPropertiesThrombus Aging +---------+---------------+---------+-----------+----------+--------------+ CFV      Full           Yes      Yes                                 +---------+---------------+---------+-----------+----------+--------------+ SFJ      Full                                                        +---------+---------------+---------+-----------+----------+--------------+ FV Prox  Full           Yes      Yes                                 +---------+---------------+---------+-----------+----------+--------------+ FV Mid   Full           Yes      Yes                                 +---------+---------------+---------+-----------+----------+--------------+ FV DistalFull           Yes      Yes                                 +---------+---------------+---------+-----------+----------+--------------+ PFV      Full                                                        +---------+---------------+---------+-----------+----------+--------------+ POP      Full           Yes      Yes                                  +---------+---------------+---------+-----------+----------+--------------+ PTV      Full                                                        +---------+---------------+---------+-----------+----------+--------------+  PERO     Full                                                        +---------+---------------+---------+-----------+----------+--------------+     Summary: BILATERAL: - No evidence of deep vein thrombosis seen in the lower extremities, bilaterally. -No evidence of popliteal cyst, bilaterally.   *See table(s) above for measurements and observations. Electronically signed by Delaney Fearing on 06/11/2023 at 2:43:54 PM.    Final    ECHO TEE Result Date: 06/10/2023    TRANSESOPHOGEAL ECHO REPORT   Patient Name:   Anita [name] Date of Exam: 06/10/2023 Medical Rec #:  [id number]       Height:       66.0 in Accession #:    [id number]      Weight:       227.5 lb Date of Birth:  [birth year]       BSA:          2.112 m Patient Age:    32 years        BP:           182/139 mmHg Patient Gender: F               HR:           102 bpm. Exam Location:  Inpatient Procedure: Transesophageal Echo, Color Doppler, Cardiac Doppler and Saline            Contrast Bubble Study (Both Spectral and Color Flow Doppler were            utilized during procedure). Indications:     Stroke  History:         Patient has prior history of Echocardiogram examinations, most                  recent 03/19/2022.  Sonographer:     Hersey Lorenzo RDCS Referring Phys:  [id number] ZANE ADAMS Diagnosing Phys: Olinda Bertrand PROCEDURE: After discussion of the risks and benefits of a TEE, an informed consent was obtained from the patient. The transesophogeal probe was passed without difficulty through the esophogus of the patient. Sedation performed by different physician. Image quality was good. The patient's vital signs; including heart rate, blood pressure, and oxygen  saturation; remained stable throughout the procedure. Supplementary  images were obtained from transthoracic windows as indicated to answer the clinical question. The patient developed no complications during the procedure.  IMPRESSIONS  1. Left ventricular ejection fraction, by estimation, is 60 to 65%. The left ventricle has normal function. The left ventricle has no regional wall motion abnormalities. Moderate to severe left ventricular hypertrophy.  2. Right ventricular systolic function is normal. The right ventricular size is normal.  3. No left atrial/left atrial appendage thrombus was detected. The LAA emptying velocity was 60 cm/s.  4. A small pericardial effusion is present. The pericardial effusion is circumferential. There is no evidence of cardiac tamponade.  5. The mitral valve is normal in structure. No evidence of mitral valve regurgitation. No evidence of mitral stenosis.  6. The aortic valve is normal in structure. Aortic valve regurgitation is not visualized. No aortic stenosis is present.  7. The inferior vena cava is normal in size with greater than 50% respiratory variability, suggesting right atrial pressure of 3 mmHg.  8. Agitated  saline contrast bubble study was negative, with no evidence of any interatrial shunt. Conclusion(s)/Recommendation(s): No LA/LAA thrombus identified. Negative bubble study for interatrial shunt. No intracardiac source of embolism detected on this on this transesophageal echocardiogram. FINDINGS  Left Ventricle: Left ventricular ejection fraction, by estimation, is 60 to 65%. The left ventricle has normal function. The left ventricle has no regional wall motion abnormalities. The left ventricular internal cavity size was normal in size. Moderate  to severe left ventricular hypertrophy. Right Ventricle: The right ventricular size is normal. No increase in right ventricular wall thickness. Right ventricular systolic function is normal. Left Atrium: Left atrial size was normal in size. No left atrial/left atrial appendage thrombus was  detected. The LAA emptying velocity was 60 cm/s. Right Atrium: Right atrial size was normal in size. Pericardium: A small pericardial effusion is present. The pericardial effusion is circumferential. There is no evidence of cardiac tamponade. Mitral Valve: The mitral valve is normal in structure. No evidence of mitral valve regurgitation. No evidence of mitral valve stenosis. Tricuspid Valve: The tricuspid valve is normal in structure. Tricuspid valve regurgitation is not demonstrated. No evidence of tricuspid stenosis. Aortic Valve: The aortic valve is normal in structure. Aortic valve regurgitation is not visualized. No aortic stenosis is present. Aortic valve mean gradient measures 2.0 mmHg. Aortic valve peak gradient measures 3.5 mmHg. Aortic valve area, by VTI measures 3.85 cm. Pulmonic Valve: The pulmonic valve was normal in structure. Pulmonic valve regurgitation is trivial. No evidence of pulmonic stenosis. Aorta: The aortic root and ascending aorta are structurally normal, with no evidence of dilitation. Venous: The left upper pulmonary vein and left lower pulmonary vein are normal. The inferior vena cava is normal in size with greater than 50% respiratory variability, suggesting right atrial pressure of 3 mmHg. IAS/Shunts: No atrial level shunt detected by color flow Doppler. Agitated saline contrast was given intravenously to evaluate for intracardiac shunting. Agitated saline contrast bubble study was negative, with no evidence of any interatrial shunt. Additional Comments: Spectral Doppler performed. LEFT VENTRICLE PLAX 2D LVOT diam:     2.30 cm LV SV:         53 LV SV Index:   25 LVOT Area:     4.15 cm  AORTIC VALVE AV Area (Vmax):    3.64 cm AV Area (Vmean):   3.69 cm AV Area (VTI):     3.85 cm AV Vmax:           92.90 cm/s AV Vmean:          60.800 cm/s AV VTI:            0.138 m AV Peak Grad:      3.5 mmHg AV Mean Grad:      2.0 mmHg LVOT Vmax:         81.50 cm/s LVOT Vmean:        54.000 cm/s  LVOT VTI:          0.128 m LVOT/AV VTI ratio: 0.93  AORTA Ao Root diam: 3.50 cm Ao Asc diam:  3.50 cm  SHUNTS Systemic VTI:  0.13 m Systemic Diam: 2.30 cm Sunit Tolia Electronically signed by Olinda Bertrand Signature Date/Time: 06/10/2023/12:17:07 PM    Final    EP STUDY Result Date: 06/10/2023 See surgical note for result.  CT ANGIO HEAD NECK W WO CM Addendum Date: 06/08/2023 ADDENDUM REPORT: 06/08/2023 23:00 ADDENDUM: Imaging results were communicated on 06/08/2023 at 10:48 pm to provider Dr. Murvin Arthurs via telephone. Electronically Signed  By: Stevenson Elbe M.D.   On: 06/08/2023 23:00   Result Date: 06/08/2023 CLINICAL DATA:  Stroke/TIA, determine embolic source EXAM: CT ANGIOGRAPHY HEAD AND NECK WITH AND WITHOUT CONTRAST TECHNIQUE: Multidetector CT imaging of the head and neck was performed using the standard protocol during bolus administration of intravenous contrast. Multiplanar CT image reconstructions and MIPs were obtained to evaluate the vascular anatomy. Carotid stenosis measurements (when applicable) are obtained utilizing NASCET criteria, using the distal internal carotid diameter as the denominator. RADIATION DOSE REDUCTION: This exam was performed according to the departmental dose-optimization program which includes automated exposure control, adjustment of the mA and/or kV according to patient size and/or use of iterative reconstruction technique. CONTRAST:  75mL OMNIPAQUE  IOHEXOL  350 MG/ML SOLN COMPARISON:  Same day CT head and MRI head. FINDINGS: CTA NECK FINDINGS Aortic arch: Great vessel origins are patent without significant stenosis. Right carotid system: No evidence of dissection, stenosis (50% or greater), or occlusion. Left carotid system: No evidence of dissection, stenosis (50% or greater), or occlusion. Vertebral arteries: Codominant. No evidence of dissection, stenosis (50% or greater), or occlusion. Skeleton: No acute abnormality on limited assessment. Other neck: No acute  abnormality on limited assessment. Upper chest: Visualized lung apices are clear. Review of the MIP images confirms the above findings CTA HEAD FINDINGS Anterior circulation: Bilateral intracranial and ACAs are patent without proximal hemodynamically significant stenosis. Left MCA is patent without proximal hemodynamically significant stenosis. Occluded right M3 MCA vessel (see series 5, image 92 and series 8, images 130 through 140). Posterior circulation: Bilateral intradural vertebral arteries, basilar artery and bilateral posterior is are patent without proximal hemodynamically significant stenosis. Bilateral fetal type PCAs with small vertebrobasilar system, anatomic variant. Venous sinuses: No evidence of dural venous sinus thrombosis. Review of the MIP images confirms the above findings IMPRESSION: 1. Occluded right M3 MCA, as described above. 2. Otherwise, no large vessel occlusion or proximal hemodynamically significant stenosis. Electronically Signed: By: Stevenson Elbe M.D. On: 06/08/2023 22:44   MR BRAIN WO CONTRAST Result Date: 06/08/2023 CLINICAL DATA:  Provided history: Neuro deficit, acute, stroke suspected. EXAM: MRI HEAD WITHOUT CONTRAST TECHNIQUE: Multiplanar, multiecho pulse sequences of the brain and surrounding structures were obtained without intravenous contrast. COMPARISON:  Non-contrast head CT performed earlier today 07/05/2023. FINDINGS: Brain: Cerebral volume is normal. Small acute right middle cerebral artery territory cortical infarcts within the insula and at the posterior aspect of the sylvian fissure (for instance as seen on series 5, images 70 and 73). Additional small patchy acute right MCA territory infarcts within the right frontoparietal white matter (series 5, images 76-78). Background multifocal T2 FLAIR hyperintense signal abnormality within the cerebral white matter, mild but unexpected for age. Expanded and partially empty sella turcica. No evidence of an intracranial  mass. No chronic intracranial blood products. No extra-axial fluid collection. No midline shift. Vascular: Maintained flow voids within the proximal large arterial vessels. Skull and upper cervical spine: Abnormal T1 hypointense marrow signal within the calvarium and within visible portions of the cervical spine. Sinuses/Orbits: No mass or acute finding within the imaged orbits. No significant paranasal sinus disease. Impression #1 called by telephone at the time of interpretation on 06/08/2023 at 7:26 pm to provider ANDREW TEE , who verbally acknowledged these results. IMPRESSION: 1. 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 2. Background T2 FLAIR hyperintense signal changes within the cerebral white matter, overall mild but unexpected for age. Findings are nonspecific and differential considerations include age-advanced chronic small vessel ischemic disease,  sequelae of chronic migraine headaches, vasculitis, sequelae of a prior infectious/inflammatory process and demyelinating disease, among others. 3. Abnormal T1 hypointense marrow signal within the calvarium and within visible portions of the cervical spine. While this finding can reflect a marrow infiltrative process, the most common causes include chronic anemia, smoking and obesity. Electronically Signed   By: Bascom Lily D.O.   On: 06/08/2023 19:28   CT HEAD CODE STROKE WO CONTRAST Result Date: 06/08/2023 CLINICAL DATA:  Code stroke. Provided history: Neuro deficit, acute, stroke suspected. Left-sided facial droop. Left arm weakness. EXAM: CT HEAD WITHOUT CONTRAST TECHNIQUE: Contiguous axial images were obtained from the base of the skull through the vertex without intravenous contrast. RADIATION DOSE REDUCTION: This exam was performed according to the departmental dose-optimization program which includes automated exposure control, adjustment of the mA and/or kV according to patient size and/or use of iterative reconstruction  technique. COMPARISON:  Head CT 03/18/2022. FINDINGS: Brain: Cerebral volume is normal. Prominent perivascular space within the right basal ganglia inferiorly. Expanded and partially empty sella turcica. There is no acute intracranial hemorrhage. No demarcated cortical infarct. No extra-axial fluid collection. No evidence of an intracranial mass. No midline shift. Vascular: No hyperdense vessel. Skull: No calvarial fracture or aggressive osseous lesion. Sinuses/Orbits: No mass or acute finding within the imaged orbits. No significant paranasal sinus disease at the imaged levels. ASPECTS [facility] Stroke Program Early CT Score) - Ganglionic level infarction (caudate, lentiform nuclei, internal capsule, insula, M1-M3 cortex): 7 - Supraganglionic infarction (M4-M6 cortex): 3 Total score (0-10 with 10 being normal): 10 No evidence of an acute intracranial abnormality. These results were communicated to Dr. Doretta Gant at 4:32 pmon 6/3/2025by text page via the [facility] messaging system. IMPRESSION: No evidence of an acute intracranial abnormality. Electronically Signed   By: Bascom Lily D.O.   On: 06/08/2023 16:32     Time coordinating discharge: Over 30 minutes    Faith Homes, MD  [facility] 06/12/2023, 3:29 PM

## 2023-06-12 NOTE — Plan of Care (Signed)
   Problem: Education: Goal: Knowledge of disease or condition will improve Outcome: Progressing Goal: Knowledge of secondary prevention will improve (MUST DOCUMENT ALL) Outcome: Progressing Goal: Knowledge of patient specific risk factors will improve (DELETE if not current risk factor) Outcome: Progressing   Problem: Ischemic Stroke/TIA Tissue Perfusion: Goal: Complications of ischemic stroke/TIA will be minimized Outcome: Progressing

## 2023-06-14 ENCOUNTER — Other Ambulatory Visit: Payer: Self-pay | Admitting: Cardiology

## 2023-06-14 ENCOUNTER — Telehealth: Payer: Self-pay | Admitting: *Deleted

## 2023-06-14 DIAGNOSIS — I63511 Cerebral infarction due to unspecified occlusion or stenosis of right middle cerebral artery: Secondary | ICD-10-CM

## 2023-06-14 DIAGNOSIS — I639 Cerebral infarction, unspecified: Secondary | ICD-10-CM

## 2023-06-14 NOTE — Transitions of Care (Post Inpatient/ED Visit) (Signed)
   06/14/2023  Name: Anita [name] MRN: [id number] DOB: 12/31/1990  RNCM spoke with Clerance Dais and verified patient is no longer with [facility]. Patient is scheduled to have a hospital follow up appointment with  Tretha Fu, MD Follow up on 06/16/2023.   Specialty: Internal Medicine Why: our appointment is at 10:00 am. please arrive early and bring a picture ID, current medications and insurance card Contact information: [address] [place] [address] [phone]  RNCM verified patient is planning to attend this scheduled visit.    Arna Better RN, BSN [place]  [facility] RN Care Manager [phone]

## 2023-06-16 DIAGNOSIS — Z8673 Personal history of transient ischemic attack (TIA), and cerebral infarction without residual deficits: Secondary | ICD-10-CM | POA: Diagnosis not present

## 2023-06-16 DIAGNOSIS — D509 Iron deficiency anemia, unspecified: Secondary | ICD-10-CM | POA: Diagnosis not present

## 2023-06-16 DIAGNOSIS — Z0001 Encounter for general adult medical examination with abnormal findings: Secondary | ICD-10-CM | POA: Diagnosis not present

## 2023-06-16 DIAGNOSIS — Z720 Tobacco use: Secondary | ICD-10-CM | POA: Diagnosis not present

## 2023-06-16 DIAGNOSIS — E538 Deficiency of other specified B group vitamins: Secondary | ICD-10-CM | POA: Diagnosis not present

## 2023-06-16 DIAGNOSIS — I119 Hypertensive heart disease without heart failure: Secondary | ICD-10-CM | POA: Diagnosis not present

## 2023-06-16 DIAGNOSIS — E7800 Pure hypercholesterolemia, unspecified: Secondary | ICD-10-CM | POA: Diagnosis not present

## 2023-06-28 NOTE — Therapy (Unsigned)
 OUTPATIENT OCCUPATIONAL THERAPY NEURO EVALUATION  Patient Name: Anita [name] MRN: [id number] DOB:02-05-1990, 33 y.o., female Today's Date: 07/01/2023  PCP: Theotis Ohm NP REFERRING PROVIDER: Dr. Patsy  END OF SESSION:  OT End of Session - 07/01/23 0828     Visit Number 1    Number of Visits 9    Date for OT Re-Evaluation 09/23/23    Authorization Type [employer]    Authorization Time Period 8 visits over 12 weeks    OT Start Time 0829    OT Stop Time 0900    OT Time Calculation (min) 31 min    Activity Tolerance Patient tolerated treatment well    Behavior During Therapy WFL for tasks assessed/performed;Flat affect          Past Medical History:  Diagnosis Date   Anemia    Arthritis    Gout    Hypertension    Past Surgical History:  Procedure Laterality Date   KNEE SURGERY     LEG SURGERY     TRANSESOPHAGEAL ECHOCARDIOGRAM (CATH LAB) N/A 06/10/2023   Procedure: TRANSESOPHAGEAL ECHOCARDIOGRAM;  Surgeon: Michele Richardson, DO;  Location: [facility] INVASIVE CV LAB;  Service: Cardiovascular;  Laterality: N/A;   Patient Active Problem List   Diagnosis Date Noted   Obesity (BMI 30-39.9) 06/09/2023   Acute ischemic right MCA stroke (HCC) 06/08/2023   Thyromegaly 07/23/2022   Tobacco use 07/23/2022   Uncontrolled hypertension 07/07/2022   Depo-Provera  contraceptive status 07/07/2022   Hypertensive emergency 03/19/2022   Elevated troponin 03/19/2022   Cardiomegaly 03/19/2022   Microcytic anemia 03/19/2022   Encounter for elective termination of pregnancy 10/12/2014    ONSET DATE: 06/10/23- referral date  REFERRING DIAG: Diagnosis  I63.9 (ICD-10-CM) - Acute ischemic stroke (HCC)    THERAPY DIAG:  Muscle weakness (generalized) - Plan: Ot plan of care cert/re-cert  Other lack of coordination - Plan: Ot plan of care cert/re-cert  Attention and concentration deficit - Plan: Ot plan of care cert/re-cert  Frontal lobe and executive function deficit - Plan: Ot plan of  care cert/re-cert  Rationale for Evaluation and Treatment: Rehabilitation  SUBJECTIVE:   SUBJECTIVE STATEMENT: Pt reports she forgot to take her BP meds today Pt accompanied by: self  PERTINENT HISTORY: Anita [name] is a 33 y.o. female who presented to the ED 06/08/23 via EMS for evaluation of left-sided facial droop, slurred speech, left-sided weakness and numbness, and right-sided headache. Found to have high BP 192/114. She has not taken any BP meds for almost a year. MRI brain showed multiple small acute right middle cerebral artery territory infarcts. CTA head and neck showed occluded right M3 MCA. PMH significant fro HTN, gout, arthritis, and anemia.  PRECAUTIONS: Fall- PT is addressing  WEIGHT BEARING RESTRICTIONS: No  PAIN:  Are you having pain? No  FALLS: Has patient fallen in last 6 months? No  LIVING ENVIRONMENT: Lives with: lives with their family Lives in: House/apartment Stairs: Yes: Internal: flight steps; on left going up Has following equipment at home: Walker - 2 wheeled and bed side commode  PLOF: Independent  PATIENT GOALS: get back to prior level  OBJECTIVE:  Note: Objective measures were completed at Evaluation unless otherwise noted.  HAND DOMINANCE: Left  ADLs:  Transfers/ambulation related to ADLs:mod I Eating: mod I Grooming: difficulty brushing hair, needs assit UB Dressing: supervision and min v.c LB Dressing: supervision min v.c  Toileting: mod I Bathing: mod I Tub Shower transfers: mod I  IADLs:Pt was a Location manager prior  to CVA, in Art therapist  Light housekeeping: Pt has attempted however she fatigues to quickly to complete Meal Prep: has not attempted Community mobility: mod I Medication management: partner assists Financial management: partner assists Handwriting: 100% legible and Increased time, pt would like to write faster  MOBILITY STATUS: mod I    ACTIVITY TOLERANCE: Activity tolerance: Decreased activity  tolerance, able to stand for grossly 5 mins prior to rest  FUNCTIONAL OUTCOME MEASURES: Quick Dash: 30% disability  UPPER EXTREMITY ROM:  AROM WFLS    UPPER EXTREMITY MMT:     MMT Right eval Left eval  Shoulder flexion 4+/5 4/5  Shoulder abduction    Shoulder adduction    Shoulder extension    Shoulder internal rotation    Shoulder external rotation    Middle trapezius    Lower trapezius    Elbow flexion 4+/5 4/5  Elbow extension 4+/5 4-/5  Wrist flexion    Wrist extension    Wrist ulnar deviation    Wrist radial deviation    Wrist pronation    Wrist supination    (Blank rows = not tested)  HAND FUNCTION: Grip strength: Right: 55 lbs; Left: 19 lbs  COORDINATION: 9 Hole Peg test: Right: 31.50 sec; Left: 31.53 sec   SENSATION: WFL   COGNITION: Overall cognitive status: Impaired Short term memory deficits,     VISION ASSESSMENT: Not tested- denies changes     OBSERVATIONS: Pleasant female hwo would like to improve to prior level of function. BP: 145/114, checked 10 mins later 150/108- Pt is asymptomatic and forgot to take her meds.                                                                                                                              TREATMENT DATE: 07/01/23- eval          PATIENT EDUCATION: Education details: role of OT, potential goals, improtance of taking BP meds to manage BP Person educated: Patient Education method: Explanation Education comprehension: verbalized understanding  HOME EXERCISE PROGRAM: n/a   GOALS: Goals reviewed with patient? Yes  SHORT TERM GOALS: Target date: 07/30/23  I with inital HEP. Baseline:dependent Goal status: INITIAL  2.  Pt will perform dressing mod I Baseline: needs cues not to get clothes on backwards Goal status: INITIAL  3.  Pt will improve LUE grip by 5 lbs for increased ease with ADLS Baseline: see above Goal status: INITIAL  4.  Pt will demonstrate ability to stand x  15 min for functional activity prior to rest break Baseline: 10 mins max standing prior to rest Goal status: INITIAL  5.  Pt will verbalize understanding of memory compensation strategies. Baseline: dependent Goal status: INITIAL    LONG TERM GOALS: Target date: 09/23/23 (to account for scheduling)  I with updated HEP Baseline: dependent Goal status: INITIAL  2.  Pt will demonstrate improved LUE functional use as evidenced by improving Quick DASH to 25% or  better disability Baseline: 30% disability Goal status: INITIAL  3.  Pt will dmeonstrate improved fine motor coordination as eveidnced by decreaing 9 hole peg test socre to 29 secs or less. Baseline: see above Goal status: INITIAL  4.  Pt ill demonstrate ability to perfrom a physical and cognitve task simultaneously with 90% or better accuracy in prep for driving. Baseline: increased time required for Trailmaking B 57 secs  Goal status: INITIAL  5.  Pt will resume mod complex home management and cooking at a mod I level Baseline: unable due to endurance, has not fully attempted Goal status: INITIAL  6  ASSESSMENT:  CLINICAL IMPRESSION:  Anita [name] is a 33 y.o. female who presented to the ED 06/08/23 via EMS for evaluation of left-sided facial droop, slurred speech, left-sided weakness and numbness, and right-sided headache. Found to have high BP 192/114. She has not taken any BP meds for almost a year. MRI brain showed multiple small acute right middle cerebral artery territory infarcts. CTA head and neck showed occluded right M3 MCA. PMH significant fro HTN, gout, arthritis, and anemia. Pt presents with the performance deficits below. She can benefit from skilled occupational therapy to address these deficits in order to maximize pt's safety and I with ADLs/ IADLs.   PERFORMANCE DEFICITS: in functional skills including ADLs, IADLs, coordination, dexterity, sensation, strength, Fine motor control, Gross motor control,  balance, endurance, decreased knowledge of precautions, decreased knowledge of use of DME, and UE functional use, cognitive skills including attention, memory, and thought, and psychosocial skills including coping strategies, environmental adaptation, habits, interpersonal interactions, and routines and behaviors.   IMPAIRMENTS: are limiting patient from ADLs, IADLs, work, play, leisure, and social participation.   CO-MORBIDITIES: may have co-morbidities  that affects occupational performance. Patient will benefit from skilled OT to address above impairments and improve overall function.  MODIFICATION OR ASSISTANCE TO COMPLETE EVALUATION: No modification of tasks or assist necessary to complete an evaluation.  OT OCCUPATIONAL PROFILE AND HISTORY: Detailed assessment: Review of records and additional review of physical, cognitive, psychosocial history related to current functional performance.  CLINICAL DECISION MAKING: LOW - limited treatment options, no task modification necessary  REHAB POTENTIAL: Good  EVALUATION COMPLEXITY: Low    PLAN:  OT FREQUENCY: 8 visits  OT DURATION: 10 weeks to account for scheduling  PLANNED INTERVENTIONS: 97168 OT Re-evaluation, 97535 self care/ADL training, 02889 therapeutic exercise, 97530 therapeutic activity, 97112 neuromuscular re-education, 97140 manual therapy, 97035 ultrasound, 97018 paraffin, 02989 moist heat, 97010 cryotherapy, 97129 Cognitive training (first 15 min), 02869 Cognitive training(each additional 15 min), passive range of motion, balance training, functional mobility training, energy conservation, coping strategies training, patient/family education, and DME and/or AE instructions  RECOMMENDED OTHER SERVICES: PT, ST  CONSULTED AND AGREED WITH PLAN OF CARE: Patient  PLAN FOR NEXT SESSION: inital HEP for LUE coordination, light theraband, monitor BP   Anita [name], OT 07/01/2023, 11:48 AM

## 2023-06-30 DIAGNOSIS — D509 Iron deficiency anemia, unspecified: Secondary | ICD-10-CM | POA: Diagnosis not present

## 2023-06-30 DIAGNOSIS — Z0001 Encounter for general adult medical examination with abnormal findings: Secondary | ICD-10-CM | POA: Diagnosis not present

## 2023-06-30 DIAGNOSIS — Z131 Encounter for screening for diabetes mellitus: Secondary | ICD-10-CM | POA: Diagnosis not present

## 2023-06-30 DIAGNOSIS — Z8673 Personal history of transient ischemic attack (TIA), and cerebral infarction without residual deficits: Secondary | ICD-10-CM | POA: Diagnosis not present

## 2023-06-30 DIAGNOSIS — Z720 Tobacco use: Secondary | ICD-10-CM | POA: Diagnosis not present

## 2023-06-30 DIAGNOSIS — E538 Deficiency of other specified B group vitamins: Secondary | ICD-10-CM | POA: Diagnosis not present

## 2023-06-30 DIAGNOSIS — E7800 Pure hypercholesterolemia, unspecified: Secondary | ICD-10-CM | POA: Diagnosis not present

## 2023-06-30 DIAGNOSIS — I119 Hypertensive heart disease without heart failure: Secondary | ICD-10-CM | POA: Diagnosis not present

## 2023-07-01 ENCOUNTER — Ambulatory Visit: Attending: Internal Medicine | Admitting: Occupational Therapy

## 2023-07-01 ENCOUNTER — Encounter: Payer: Self-pay | Admitting: Speech Pathology

## 2023-07-01 ENCOUNTER — Other Ambulatory Visit: Payer: Self-pay

## 2023-07-01 ENCOUNTER — Ambulatory Visit: Admitting: Physical Therapy

## 2023-07-01 ENCOUNTER — Encounter: Payer: Self-pay | Admitting: Occupational Therapy

## 2023-07-01 ENCOUNTER — Encounter: Payer: Self-pay | Admitting: Physical Therapy

## 2023-07-01 ENCOUNTER — Ambulatory Visit: Admitting: Speech Pathology

## 2023-07-01 VITALS — BP 152/112

## 2023-07-01 DIAGNOSIS — M6281 Muscle weakness (generalized): Secondary | ICD-10-CM

## 2023-07-01 DIAGNOSIS — I639 Cerebral infarction, unspecified: Secondary | ICD-10-CM | POA: Insufficient documentation

## 2023-07-01 DIAGNOSIS — R41840 Attention and concentration deficit: Secondary | ICD-10-CM | POA: Diagnosis not present

## 2023-07-01 DIAGNOSIS — R41844 Frontal lobe and executive function deficit: Secondary | ICD-10-CM | POA: Diagnosis not present

## 2023-07-01 DIAGNOSIS — R278 Other lack of coordination: Secondary | ICD-10-CM

## 2023-07-01 DIAGNOSIS — R2681 Unsteadiness on feet: Secondary | ICD-10-CM | POA: Insufficient documentation

## 2023-07-01 DIAGNOSIS — R41841 Cognitive communication deficit: Secondary | ICD-10-CM | POA: Insufficient documentation

## 2023-07-01 NOTE — Therapy (Signed)
 OUTPATIENT PHYSICAL THERAPY LOWER EXTREMITY EVALUATION   Patient Name: Anita [name] MRN: [id number] DOB:06-03-1990, 33 y.o., female Today's Date: 07/01/2023  END OF SESSION:  PT End of Session - 07/01/23 0834     Visit Number 1    Number of Visits 9    Date for PT Re-Evaluation 07/29/23    Authorization Type [employer]    Authorization Time Period 07/01/23 to 08/12/23    Authorization - Number of Visits 27   split between all 3 services   PT Start Time 0804    PT Stop Time 0827    PT Time Calculation (min) 23 min    Activity Tolerance Treatment limited secondary to medical complications (Comment)   elevated BP   Behavior During Therapy WFL for tasks assessed/performed;Flat affect          Past Medical History:  Diagnosis Date   Anemia    Arthritis    Gout    Hypertension    Past Surgical History:  Procedure Laterality Date   KNEE SURGERY     LEG SURGERY     TRANSESOPHAGEAL ECHOCARDIOGRAM (CATH LAB) N/A 06/10/2023   Procedure: TRANSESOPHAGEAL ECHOCARDIOGRAM;  Surgeon: Michele Richardson, DO;  Location: [facility];  Service: Cardiovascular;  Laterality: N/A;   Patient Active Problem List   Diagnosis Date Noted   Obesity (BMI 30-39.9) 06/09/2023   Acute ischemic right MCA stroke (HCC) 06/08/2023   Thyromegaly 07/23/2022   Tobacco use 07/23/2022   Uncontrolled hypertension 07/07/2022   Depo-Provera  contraceptive status 07/07/2022   Hypertensive emergency 03/19/2022   Elevated troponin 03/19/2022   Cardiomegaly 03/19/2022   Microcytic anemia 03/19/2022   Encounter for elective termination of pregnancy 10/12/2014    PCP: Theotis Ohm NP   REFERRING PROVIDER: Patsy Lenis, MD  REFERRING DIAG: I63.9 (ICD-10-CM) - Acute ischemic stroke [facility])  THERAPY DIAG:  Muscle weakness (generalized)  Unsteadiness on feet  Other lack of coordination  Rationale for Evaluation and Treatment: Rehabilitation  ONSET DATE: 06/08/23  SUBJECTIVE:   SUBJECTIVE  STATEMENT:  Had my stroke in early June, they think it was from high blood pressure. Didn't check BP this morning, also didn't take BP meds.  Getting anxiety when in buildings and cars and things, can be hard to stand in one place for a long time. Legs can get weak and I'll have to sit down. No concerns about falling/stumbling/tripping. Words aren't coming to me when I'm talking now, I have to think about what I'm saying.    PERTINENT HISTORY: See above  PAIN:  Are you having pain? No   PRECAUTIONS: None  RED FLAGS: None   WEIGHT BEARING RESTRICTIONS: No  FALLS:  Has patient fallen in last 6 months? 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  DIAGNOSTIC FINDINGS: CLINICAL DATA:  Provided history: Neuro deficit, acute, stroke suspected.   EXAM: MRI HEAD WITHOUT CONTRAST   TECHNIQUE: Multiplanar, multiecho pulse sequences of the brain and surrounding structures were obtained without intravenous contrast.   COMPARISON:  Non-contrast head CT  performed earlier today 07/05/2023.   FINDINGS: Brain:   Cerebral volume is normal.   Small acute right middle cerebral artery territory cortical infarcts within the insula and at the posterior aspect of the sylvian fissure (for instance as seen on series 5, images 70 and 73).   Additional small patchy acute right MCA territory infarcts within the right frontoparietal white matter (series 5, images 76-78).   Background multifocal T2 FLAIR  hyperintense signal abnormality within the cerebral white matter, mild but unexpected for age.   Expanded and partially empty sella turcica.   No evidence of an intracranial mass.   No chronic intracranial blood products.   No extra-axial fluid collection.   No midline shift.   Vascular: Maintained flow voids within the proximal large arterial vessels.   Skull and upper cervical spine: Abnormal T1 hypointense marrow signal within the calvarium and within visible portions of the cervical spine.   Sinuses/Orbits: No mass or acute finding within the imaged orbits. No significant paranasal sinus disease.   Impression #1 called by telephone at the time of interpretation on 06/08/2023 at 7:26 pm to provider ANDREW TEE , who verbally acknowledged these results.   IMPRESSION: 1. 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 2. Background T2 FLAIR hyperintense signal changes within the cerebral white matter, overall mild but unexpected for age. Findings are nonspecific and differential considerations include age-advanced chronic small vessel ischemic disease, sequelae of chronic migraine headaches, vasculitis, sequelae of a prior infectious/inflammatory process and demyelinating disease, among others. 3. Abnormal T1 hypointense marrow signal within the calvarium and within visible portions of the cervical spine. While this finding can reflect a marrow infiltrative process, the most common causes include chronic anemia, smoking and obesity.    Lower Venous DVT Study   Patient Name:  Anita [name]  Date of Exam:   06/11/2023  Medical Rec #: [id number]        Accession #:    [id number]  Date of Birth: Jan 16, 1990        Patient Gender: F  Patient Age:   32 years  Exam Location[facility]  [facility]  Procedure:      VAS US  LOWER EXTREMITY VENOUS (DVT)  Referring Phys: PRAMOD SETHI    ---------------------------------------------------------------------------   -----    Indications: Stroke.    Comparison Study: No previous exams   Performing Technologist: Jody Hill RVT, RDMS     Examination Guidelines:  A complete evaluation includes B-mode imaging, spectral Doppler, color  Doppler,  and power Doppler as needed of all accessible portions of each vessel.  Bilateral  testing is considered an integral part of a complete examination. Limited  examinations for reoccurring indications may be performed as noted. The  reflux  portion of the exam is performed with the patient in reverse  Trendelenburg.      +---------+---------------+---------+-----------+----------+--------------+   RIGHT   CompressibilityPhasicitySpontaneityPropertiesThrombus  Aging  +---------+---------------+---------+-----------+----------+--------------+   CFV     Full           Yes      Yes                                   +---------+---------------+---------+-----------+----------+--------------+   SFJ     Full                                                          +---------+---------------+---------+-----------+----------+--------------+  FV Prox  Full           Yes      Yes                                   +---------+---------------+---------+-----------+----------+--------------+   FV Mid   Full           Yes      Yes                                   +---------+---------------+---------+-----------+----------+--------------+   FV DistalFull           Yes      Yes                                   +---------+---------------+---------+-----------+----------+--------------+   PFV     Full                                                          +---------+---------------+---------+-----------+----------+--------------+   POP     Full           Yes      Yes                                   +---------+---------------+---------+-----------+----------+--------------+   PTV     Full                                                           +---------+---------------+---------+-----------+----------+--------------+   PERO    Full                                                          +---------+---------------+---------+-----------+----------+--------------+          +---------+---------------+---------+-----------+----------+--------------+   LEFT    CompressibilityPhasicitySpontaneityPropertiesThrombus  Aging  +---------+---------------+---------+-----------+----------+--------------+   CFV     Full           Yes      Yes                                   +---------+---------------+---------+-----------+----------+--------------+   SFJ     Full                                                          +---------+---------------+---------+-----------+----------+--------------+   FV Prox  Full           Yes      Yes                                   +---------+---------------+---------+-----------+----------+--------------+  FV Mid   Full           Yes      Yes                                   +---------+---------------+---------+-----------+----------+--------------+   FV DistalFull           Yes      Yes                                   +---------+---------------+---------+-----------+----------+--------------+   PFV     Full                                                          +---------+---------------+---------+-----------+----------+--------------+   POP     Full           Yes      Yes                                   +---------+---------------+---------+-----------+----------+--------------+   PTV     Full                                                          +---------+---------------+---------+-----------+----------+--------------+   PERO    Full                                                           +---------+---------------+---------+-----------+----------+--------------+               Summary:  BILATERAL:  - No evidence of deep vein thrombosis seen in the lower extremities,  bilaterally.  -No evidence of popliteal cyst, bilaterally.    PATIENT SURVEYS:   ABC 78.1%  COGNITION: Overall cognitive status: Within functional limits for tasks assessed         LOWER EXTREMITY MMT:  MMT Right eval Left eval  Hip flexion    Hip extension    Hip abduction    Hip adduction    Hip internal rotation    Hip external rotation    Knee flexion    Knee extension    Ankle dorsiflexion    Ankle plantarflexion    Ankle inversion    Ankle eversion     (Blank rows = not tested)  MMT deferred at eval due to elevated diastolic BP in context of recent CVA     FUNCTIONAL TESTS:   Eval:   SLS 30 seconds L, 30 seconds R but knee hyperextension and noted LE instability  Tandem stance 30 seconds B   GAIT: Distance walked: 149ft Assistive device utilized: None Level of assistance: Complete Independence Comments: noted proximal weakness with B knee valgus, trendelenburg, slow pace of gait with trunk/core weakness  TREATMENT DATE:   07/01/23  BP #1 136/105 HR 72 BP #2 141/104 HR 79  Eval, POC, education as below       PATIENT EDUCATION:  Education details: exam findings, POC, importance of good BP management- not able to push too hard today due to elevated diastolic BP especially in context of recent CVA, please take meds prior to therapy sessions so we can safely work with her  Person educated: Patient Education method: Explanation Education comprehension: verbalized understanding  HOME EXERCISE PROGRAM: 2nd session   ASSESSMENT:  CLINICAL IMPRESSION: Patient is a 33 y.o. F who was seen today for physical therapy evaluation  and treatment for I63.9 (ICD-10-CM) - Acute ischemic stroke (HCC). Of note BP was high especially diastolic, she did not take her BP meds this morning. Eval was limited due to safety concerns with exertion in context of elevated diastolic values with recent CVA. Deferred MMT due to elevated BP at eval. She would likely benefit from some attention from skilled PT to work on general strength/balance but given insurance visit limits with split PT/OT/Speech services, do not anticipate long POC.   OBJECTIVE IMPAIRMENTS: Abnormal gait, decreased activity tolerance, decreased balance, decreased coordination, decreased mobility, difficulty walking, decreased strength, and decreased safety awareness.   ACTIVITY LIMITATIONS: standing, stairs, transfers, and locomotion level  PARTICIPATION LIMITATIONS: meal prep, cleaning, laundry, shopping, community activity, and occupation  PERSONAL FACTORS: Behavior pattern, Education, Fitness, Past/current experiences, Social background, and Time since onset of injury/illness/exacerbation are also affecting patient's functional outcome.   REHAB POTENTIAL: Good  CLINICAL DECISION MAKING: Stable/uncomplicated  EVALUATION COMPLEXITY: Low   GOALS: Goals reviewed with patient? No  SHORT TERM GOALS: Target date: 07/15/2023   Will be compliant with appropriate progressive HEP  Baseline: Goal status: INITIAL  2.  Will be able to teach back on importance of good BP control especially after recent CVA  Baseline:  Goal status: INITIAL    LONG TERM GOALS: Target date: 07/29/2023    MMT to be at least 4+/5 all tested groups  Baseline:  Goal status: INITIAL  2.  Will score at least 22 on DGI  Baseline:  Goal status: INITIAL  3.  Will be able to ambulate at least 572ft in to show improved community access Baseline:  Goal status: INITIAL  4.  ABC score to have improved by at least 10 points  Baseline:  Goal status: INITIAL    PLAN:  PT FREQUENCY:  1-2x/week  PT DURATION: 4 weeks  PLANNED INTERVENTIONS: 97750- Physical Performance Testing, 97110-Therapeutic exercises, 97530- Therapeutic activity, [id number]- Neuromuscular re-education, 97535- Self Care, [id number]- Manual therapy, and 97116- Gait training  PLAN FOR NEXT SESSION: Check BP. Still needs HEP. Otherwise strength, balance, functional activity tolerance if BP is WNL   Josette Rough, PT, DPT 07/01/23 8:36 AM

## 2023-07-01 NOTE — Therapy (Signed)
 OUTPATIENT SPEECH LANGUAGE PATHOLOGY EVALUATION   Patient Name: Anita [name] MRN: [id number] DOB:Jun 14, 1990, 33 y.o., female Today's Date: 07/01/2023  PCP: Theotis Haze ORN, NP REFERRING PROVIDER: Patsy Lenis, MD  END OF SESSION:  End of Session - 07/01/23 0934     Visit Number 1    Date for SLP Re-Evaluation 08/31/23    SLP Start Time 0930    SLP Stop Time  1010    SLP Time Calculation (min) 40 min    Activity Tolerance Patient tolerated treatment well          Past Medical History:  Diagnosis Date   Anemia    Arthritis    Gout    Hypertension    Past Surgical History:  Procedure Laterality Date   KNEE SURGERY     LEG SURGERY     TRANSESOPHAGEAL ECHOCARDIOGRAM (CATH LAB) N/A 06/10/2023   Procedure: TRANSESOPHAGEAL ECHOCARDIOGRAM;  Surgeon: Michele Richardson, DO;  Location: [facility];  Service: Cardiovascular;  Laterality: N/A;   Patient Active Problem List   Diagnosis Date Noted   Obesity (BMI 30-39.9) 06/09/2023   Acute ischemic right MCA stroke (HCC) 06/08/2023   Thyromegaly 07/23/2022   Tobacco use 07/23/2022   Uncontrolled hypertension 07/07/2022   Depo-Provera  contraceptive status 07/07/2022   Hypertensive emergency 03/19/2022   Elevated troponin 03/19/2022   Cardiomegaly 03/19/2022   Microcytic anemia 03/19/2022   Encounter for elective termination of pregnancy 10/12/2014    ONSET DATE: referred on 06/10/23  REFERRING DIAG: I63.9 (ICD-10-CM) - Acute ischemic stroke (HCC)   THERAPY DIAG:  Cognitive communication deficit  Rationale for Evaluation and Treatment: Rehabilitation  SUBJECTIVE:   SUBJECTIVE STATEMENT:  Pt did not take meds this morning, as she was rushing out the door. Blood pressure high.   Pt accompanied by: self  PERTINENT HISTORY: Per chart review, 33 year old female with PMH HTN, anemia, arthritis, gout, CVA (06/2023)   PAIN:  Are you having pain? No  FALLS: Has patient fallen in last 6 months?  No  LIVING  ENVIRONMENT: Lives with: lives with their family; boyfriend ([name]); 2 kids ([name] and [name])  Lives in: House/apartment  PLOF:  Level of assistance: Independent with ADLs, Independent with IADLs Employment: Full-time employment; Location manager @ [employer]  PATIENT GOALS: memory, word finding  OBJECTIVE:  Note: Objective measures were completed at Evaluation unless otherwise noted.  DIAGNOSTIC FINDINGS: Per chart review:   MR BRAIN WO CONTRAST 06/08/2023  IMPRESSION: 1. 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 2. Background T2 FLAIR hyperintense signal changes within the cerebral white matter, overall mild but unexpected for age. Findings are nonspecific and differential considerations include age-advanced chronic small vessel ischemic disease, sequelae of chronic migraine headaches, vasculitis, sequelae of a prior infectious/inflammatory process and demyelinating disease, among others. 3. Abnormal T1 hypointense marrow signal within the calvarium and within visible portions of the cervical spine. While this finding can reflect a marrow infiltrative process, the most common causes include chronic anemia, smoking and obesity.     Electronically Signed   By: Rockey Childs D.O.   On: 06/08/2023 19:28  COGNITION: Overall cognitive status: Impaired Areas of impairment:  Attention: Impaired: Alternating, Divided Memory: Impaired: Working Proofreader function: Impaired: Problem solving, Organization, Planning, and Slow processing Functional deficits:   COGNITIVE COMMUNICATION: Following directions: Follows multi-step commands with increased time  Auditory comprehension: WFL Verbal expression: WFL Functional communication: Impaired: Requiring several repetitions of conversations   ORAL MOTOR EXAMINATION: Overall status: [facility]  Comments:   STANDARDIZED ASSESSMENTS: Initiated CLQT; to complete next session      Cognitive Linguistic Quick Test: AGE - 18 - 69   The Cognitive Linguistic Quick Test (CLQT) was administered to assess the relative status of five cognitive domains: attention, memory, language, executive functioning, and visuospatial skills. Scores from 10 tasks were used to estimate severity ratings (standardized for age groups 18-69 years and 70-89 years) for each domain, a clock drawing task, as well as an overall composite severity rating of cognition.       Task Score Criterion Cut Scores  Personal Facts 8/8 8  Symbol Cancellation 12/12 11  Confrontation Naming /10 10  Clock Drawing  13/13 12  Story Retelling 5/10 6  Symbol Trails /10 9  Generative Naming 7/9 5  Design Memory /6 5  Mazes  /8 7  Design Generation /13 6     PATIENT REPORTED OUTCOME MEASURES (PROM): To complete in subsequent sessions                                                                                                                             TREATMENT DATE:     PATIENT EDUCATION: Education details: SLP role in cognitive-communication Person educated: Patient Education method: Explanation Education comprehension: verbalized understanding and needs further education   GOALS: Goals reviewed with patient? No  SHORT TERM GOALS: Target date: 07/31/23  Complete CLQT, PROMs, and update goals  Baseline: Goal status: INITIAL  2.   Baseline:  Goal status: INITIAL  3.   Baseline:  Goal status: INITIAL  4.   Baseline:  Goal status: INITIAL  5.   Baseline:  Goal status: INITIAL  6.   Baseline:  Goal status: INITIAL  LONG TERM GOALS: Target date: 08/31/23  Improve score on PROMs  Baseline:  Goal status: INITIAL  2.   Baseline:  Goal status: INITIAL  3.   Baseline:  Goal status: INITIAL  4.   Baseline:  Goal status: INITIAL  5.   Baseline:  Goal status: INITIAL  6.   Baseline:  Goal status: INITIAL  ASSESSMENT:  CLINICAL IMPRESSION: Pt is a 33 yo  female who presents to [facility] for evaluation post CVA. Pt endorses re: feeling slowed down - taking longer to get thoughts out; her boyfriend having to repeat himself several times for pt to recall details from conversation; trouble finding words. She reports anxiety at baseline; however, reports feeling depressed and having sudden onset of crying. Pt denied thoughts of self-harm. SLP encouraged pt to talk to doctor at her next appointment about options. SLP to provide relaxation strategies next session. Pt was assessed using CLQT - to complete next session. SLP observed slowed processing and repetition of directions. SLP rec skilled ST services to address cognitive-communication impairment to maximize functional communication and increase quality of life.     OBJECTIVE IMPAIRMENTS: include attention, memory, and executive functioning. These impairments are limiting patient from return to work,  managing medications, managing appointments, managing finances, household responsibilities, and effectively communicating at home and in community. Factors affecting potential to achieve goals and functional outcome are NA.SABRA Patient will benefit from skilled SLP services to address above impairments and improve overall function.  REHAB POTENTIAL: Good  PLAN:  SLP FREQUENCY: 1x/week  SLP DURATION: 8 weeks  PLANNED INTERVENTIONS: Environmental controls, Cueing hierachy, Internal/external aids, Functional tasks, SLP instruction and feedback, Compensatory strategies, Patient/family education, and [id number] Treatment of speech (30 or 45 min)     [employer], CCC-SLP 07/01/2023, 11:55 AM

## 2023-07-02 ENCOUNTER — Ambulatory Visit: Payer: Self-pay | Admitting: Cardiology

## 2023-07-07 ENCOUNTER — Encounter: Payer: Self-pay | Admitting: Nurse Practitioner

## 2023-07-07 ENCOUNTER — Ambulatory Visit: Attending: Nurse Practitioner | Admitting: Nurse Practitioner

## 2023-07-07 VITALS — BP 126/84 | HR 77 | Resp 19 | Ht 66.0 in | Wt 236.0 lb

## 2023-07-07 DIAGNOSIS — F32A Depression, unspecified: Secondary | ICD-10-CM | POA: Diagnosis not present

## 2023-07-07 DIAGNOSIS — F419 Anxiety disorder, unspecified: Secondary | ICD-10-CM | POA: Diagnosis not present

## 2023-07-07 DIAGNOSIS — D649 Anemia, unspecified: Secondary | ICD-10-CM

## 2023-07-07 DIAGNOSIS — I10 Essential (primary) hypertension: Secondary | ICD-10-CM | POA: Diagnosis not present

## 2023-07-07 DIAGNOSIS — I63511 Cerebral infarction due to unspecified occlusion or stenosis of right middle cerebral artery: Secondary | ICD-10-CM | POA: Diagnosis not present

## 2023-07-07 MED ORDER — BUPROPION HCL ER (XL) 150 MG PO TB24: 150.0000 mg | ORAL_TABLET | Freq: Every day | ORAL | 1 refills | Status: AC

## 2023-07-07 NOTE — Progress Notes (Signed)
 Assessment & Plan:  [place] was seen today for hypertension.  Diagnoses and all orders for this visit:  Acute ischemic right MCA stroke [facility]) -     Ambulatory referral to Neurology  Primary hypertension -     Basic metabolic panel with GFR  Anemia, unspecified type -     CBC with Differential  Anxiety and depression -     buPROPion (WELLBUTRIN XL) 150 MG 24 hr tablet; Take 1 tablet (150 mg total) by mouth daily. For anxiety and depression    Patient has been counseled on age-appropriate routine health concerns for screening and prevention. These are reviewed and up-to-date. Referrals have been placed accordingly. Immunizations are up-to-date or declined.    Subjective:   Chief Complaint  Patient presents with   Hypertension    Anita [name] 33 y.o. female presents to office today to reestablish care. I last saw her May 2024. She had established care thereafter with Dr. Joelyn Lares. She is requesting to re establish care here today.   PMH: HTN, Anemia, gout. Medication nonadherence   Hospital Dc summary: admit 6-3 dc 06-12-2023 Anita [name]  presented with left-sided weakness and numbness along with slurred speech and left facial droop.Found to have SBP over 200 on admission in the setting of medication noncompliance  MRI brain showed multiple small acute right MCA infarcts. CTA head and neck shows occluded right M3 MCA  DAPT for 3 weeks then asa monotherapy per neuro  Sleep study positive, patient amenable with CPAP titration; machine arranged for discharge per neurology  Continue amlodipine . Resumed Coreg  and ARB  Systolic murmur - Patient with II/VI systolic murmur heard best at LUSB - Has a history of syncope with last TTE in March 2024 showing EF 64%, severe LVH and small pericardial effusion. - Underwent TEE.  EF 60 to 65%, no RWMA, moderate to severe LVH.  Small pericardial effusion.  No PFO or clots noted   Today she endorses adherence with CPAP, choleserol and  blood pressure medications. Blood pressure at goal. She has mild residual weakness per her report in all extremeties but L>R. She is left hand dominant. She is currently working with PT/OT/SLP. She had a 30 day event monitor ordered through cardiology. She is waiting to hear back about these results. Wants to know when she can return to work. Stopped working on 06-08-2023 the day of her stroke. I will not be able to answer this question for her and have referred her to neurology. She continues with mood  BP Readings from Last 3 Encounters:  07/07/23 126/84  07/01/23 (!) 152/112  06/12/23 (!) 128/91      She endorses new onset of anxiety and depression since she had the stroke. Also associated with mood lability.     07/07/2023   10:50 AM 07/23/2022   10:59 AM 05/08/2022    9:23 AM  Depression screen PHQ 2/9  Decreased Interest 0 0 0  Down, Depressed, Hopeless 2 0 2  PHQ - 2 Score 2 0 2  Altered sleeping 2 0 2  Tired, decreased energy 2 0 1  Change in appetite 0 0 0  Feeling bad or failure about yourself  2 0 1  Trouble concentrating 0 0 0  Moving slowly or fidgety/restless 0 0 0  Suicidal thoughts 0 0 0  PHQ-9 Score 8 0 6  Difficult doing work/chores Somewhat difficult         07/07/2023   10:50 AM 07/23/2022   10:59 AM  05/08/2022    9:26 AM  GAD 7 : Generalized Anxiety Score  Nervous, Anxious, on Edge 3 0 1  Control/stop worrying 2 1   Worry too much - different things 1 3 1   Trouble relaxing 0 0 1  Restless 0 0 0  Easily annoyed or irritable 0 0 0  Afraid - awful might happen 0 0 0  Total GAD 7 Score 6 4   Anxiety Difficulty Somewhat difficult            Review of Systems  Constitutional:  Negative for fever, malaise/fatigue and weight loss.  HENT: Negative.  Negative for nosebleeds.   Eyes: Negative.  Negative for blurred vision, double vision and photophobia.  Respiratory: Negative.  Negative for cough and shortness of breath.   Cardiovascular: Negative.  Negative for  chest pain, palpitations and leg swelling.  Gastrointestinal: Negative.  Negative for heartburn, nausea and vomiting.  Musculoskeletal: Negative.  Negative for myalgias.  Neurological:  Positive for weakness. Negative for dizziness, focal weakness, seizures and headaches.  Psychiatric/Behavioral:  Positive for depression. Negative for suicidal ideas. The patient is nervous/anxious.     Past Medical History:  Diagnosis Date   Anemia    Arthritis    Gout    Hypertension     Past Surgical History:  Procedure Laterality Date   KNEE SURGERY     LEG SURGERY     TRANSESOPHAGEAL ECHOCARDIOGRAM (CATH LAB) N/A 06/10/2023   Procedure: TRANSESOPHAGEAL ECHOCARDIOGRAM;  Surgeon: Michele Richardson, DO;  Location: [facility] INVASIVE [facility];  Service: Cardiovascular;  Laterality: N/A;    Family History  Problem Relation Age of Onset   Hypertension Mother    Aneurysm Father    Hypertension Sister    Alcohol abuse Neg Hx    Arthritis Neg Hx    Asthma Neg Hx    Birth defects Neg Hx    Cancer Neg Hx    COPD Neg Hx    Depression Neg Hx    Diabetes Neg Hx    Drug abuse Neg Hx    Early death Neg Hx    Hearing loss Neg Hx    Heart disease Neg Hx    Hyperlipidemia Neg Hx    Kidney disease Neg Hx    Learning disabilities Neg Hx    Mental illness Neg Hx    Mental retardation Neg Hx    Miscarriages / Stillbirths Neg Hx    Stroke Neg Hx    Vision loss Neg Hx     Social History Reviewed with no changes to be made today.   Outpatient Medications Prior to Visit  Medication Sig Dispense Refill   amLODipine  (NORVASC ) 10 MG tablet Take 1 tablet (10 mg total) by mouth daily. 90 tablet 3   aspirin  EC 81 MG tablet Take 1 tablet (81 mg total) by mouth daily. Swallow whole.     atorvastatin  (LIPITOR) 40 MG tablet Take 1 tablet (40 mg total) by mouth daily. 90 tablet 3   carvedilol  (COREG ) 12.5 MG tablet Take 1 tablet (12.5 mg total) by mouth 2 (two) times daily with a meal. 60 tablet 3   valsartan  (DIOVAN ) 160 MG  tablet Take 1 tablet (160 mg total) by mouth daily. 90 tablet 3   Blood Pressure Monitor DEVI Please provide patient with insurance approved blood pressure monitor 1 each 0   cyanocobalamin  (VITAMIN B12) 1000 MCG tablet Take 1 tablet (1,000 mcg total) by mouth daily. (Patient not taking: Reported on 07/07/2023)  medroxyPROGESTERone  (DEPO-PROVERA ) injection 150 mg      No facility-administered medications prior to visit.    No Known Allergies     Objective:    BP 126/84 (BP Location: Left Arm, Patient Position: Sitting, Cuff Size: Normal)   Pulse 77   Resp 19   Ht 5' 6 (1.676 m)   Wt 236 lb (107 kg)   SpO2 98%   BMI 38.09 kg/m  Wt Readings from Last 3 Encounters:  07/07/23 236 lb (107 kg)  06/08/23 227 lb 8.2 oz (103.2 kg)  07/23/22 249 lb 9.6 oz (113.2 kg)    Physical Exam Vitals and nursing note reviewed.  Constitutional:      Appearance: She is well-developed.  HENT:     Head: Normocephalic and atraumatic.  Cardiovascular:     Rate and Rhythm: Normal rate and regular rhythm.     Heart sounds: Normal heart sounds. No murmur heard.    No friction rub. No gallop.  Pulmonary:     Effort: Pulmonary effort is normal. No tachypnea or respiratory distress.     Breath sounds: Normal breath sounds. No decreased breath sounds, wheezing, rhonchi or rales.  Chest:     Chest wall: No tenderness.  Abdominal:     General: Bowel sounds are normal.     Palpations: Abdomen is soft.  Musculoskeletal:        General: Normal range of motion.     Cervical back: Normal range of motion.  Skin:    General: Skin is warm and dry.  Neurological:     Mental Status: She is alert and oriented to person, place, and time.     Coordination: Coordination normal.  Psychiatric:        Behavior: Behavior normal. Behavior is cooperative.        Thought Content: Thought content normal.        Judgment: Judgment normal.          Patient has been counseled extensively about nutrition and  exercise as well as the importance of adherence with medications and regular follow-up. The patient was given clear instructions to go to ER or return to medical center if symptoms don't improve, worsen or new problems develop. The patient verbalized understanding.   Follow-up: Return in about 3 months (around 10/07/2023).   Haze LELON Servant, FNP-BC [facility] [place], [place] [phone]   07/07/2023, 1:23 PM

## 2023-07-08 ENCOUNTER — Ambulatory Visit: Payer: Self-pay | Admitting: Nurse Practitioner

## 2023-07-08 LAB — CBC WITH DIFFERENTIAL/PLATELET
Basophils Absolute: 0 10*3/uL (ref 0.0–0.2)
Basos: 1 %
EOS (ABSOLUTE): 0.2 10*3/uL (ref 0.0–0.4)
Eos: 2 %
Hematocrit: 36.2 % (ref 34.0–46.6)
Hemoglobin: 10 g/dL — ABNORMAL LOW (ref 11.1–15.9)
Immature Grans (Abs): 0 10*3/uL (ref 0.0–0.1)
Immature Granulocytes: 0 %
Lymphocytes Absolute: 2.2 10*3/uL (ref 0.7–3.1)
Lymphs: 35 %
MCH: 19.7 pg — ABNORMAL LOW (ref 26.6–33.0)
MCHC: 27.6 g/dL — ABNORMAL LOW (ref 31.5–35.7)
MCV: 71 fL — ABNORMAL LOW (ref 79–97)
Monocytes Absolute: 0.4 10*3/uL (ref 0.1–0.9)
Monocytes: 6 %
Neutrophils Absolute: 3.5 10*3/uL (ref 1.4–7.0)
Neutrophils: 56 %
Platelets: 201 10*3/uL (ref 150–450)
RBC: 5.07 x10E6/uL (ref 3.77–5.28)
RDW: 19.4 % — ABNORMAL HIGH (ref 11.7–15.4)
WBC: 6.3 10*3/uL (ref 3.4–10.8)

## 2023-07-08 LAB — BASIC METABOLIC PANEL WITH GFR
BUN/Creatinine Ratio: 16 (ref 9–23)
BUN: 16 mg/dL (ref 6–20)
CO2: 18 mmol/L — ABNORMAL LOW (ref 20–29)
Calcium: 9.1 mg/dL (ref 8.7–10.2)
Chloride: 103 mmol/L (ref 96–106)
Creatinine, Ser: 1.02 mg/dL — ABNORMAL HIGH (ref 0.57–1.00)
Glucose: 87 mg/dL (ref 70–99)
Potassium: 4.1 mmol/L (ref 3.5–5.2)
Sodium: 140 mmol/L (ref 134–144)
eGFR: 75 mL/min/{1.73_m2} (ref >59)

## 2023-07-14 DIAGNOSIS — Z720 Tobacco use: Secondary | ICD-10-CM | POA: Diagnosis not present

## 2023-07-14 DIAGNOSIS — E538 Deficiency of other specified B group vitamins: Secondary | ICD-10-CM | POA: Diagnosis not present

## 2023-07-14 DIAGNOSIS — D500 Iron deficiency anemia secondary to blood loss (chronic): Secondary | ICD-10-CM | POA: Diagnosis not present

## 2023-07-14 DIAGNOSIS — E7800 Pure hypercholesterolemia, unspecified: Secondary | ICD-10-CM | POA: Diagnosis not present

## 2023-07-14 DIAGNOSIS — I119 Hypertensive heart disease without heart failure: Secondary | ICD-10-CM | POA: Diagnosis not present

## 2023-07-14 NOTE — Addendum Note (Signed)
 Addended by: Benjamen Koelling G on: 07/14/2023 12:46 PM   Modules accepted: Orders

## 2023-07-14 NOTE — Therapy (Signed)
 OUTPATIENT OCCUPATIONAL THERAPY NEURO EVALUATION  Patient Name: Anita [name] MRN: [id number] DOB:September 20, 1990, 33 y.o., female Today's Date: 07/14/2023  PCP: Theotis Ohm NP REFERRING PROVIDER: Dr. Patsy  END OF SESSION:    Past Medical History:  Diagnosis Date   Anemia    Arthritis    Gout    Hypertension    Past Surgical History:  Procedure Laterality Date   KNEE SURGERY     LEG SURGERY     TRANSESOPHAGEAL ECHOCARDIOGRAM (CATH LAB) N/A 06/10/2023   Procedure: TRANSESOPHAGEAL ECHOCARDIOGRAM;  Surgeon: Michele Richardson, DO;  Location: [facility];  Service: Cardiovascular;  Laterality: N/A;   Patient Active Problem List   Diagnosis Date Noted   Obesity (BMI 30-39.9) 06/09/2023   Acute ischemic right MCA stroke (HCC) 06/08/2023   Thyromegaly 07/23/2022   Tobacco use 07/23/2022   Uncontrolled hypertension 07/07/2022   Depo-Provera  contraceptive status 07/07/2022   Hypertensive emergency 03/19/2022   Elevated troponin 03/19/2022   Cardiomegaly 03/19/2022   Microcytic anemia 03/19/2022   Encounter for elective termination of pregnancy 10/12/2014    ONSET DATE: 06/10/23- referral date  REFERRING DIAG: Diagnosis  I63.9 (ICD-10-CM) - Acute ischemic stroke (HCC)    THERAPY DIAG:  No diagnosis found.  Rationale for Evaluation and Treatment: Rehabilitation  SUBJECTIVE:   SUBJECTIVE STATEMENT: Pt reports she forgot to take her BP meds today Pt accompanied by: self  PERTINENT HISTORY: Anita [name] is a 33 y.o. female who presented to the ED 06/08/23 via EMS for evaluation of left-sided facial droop, slurred speech, left-sided weakness and numbness, and right-sided headache. Found to have high BP 192/114. She has not taken any BP meds for almost a year. MRI brain showed multiple small acute right middle cerebral artery territory infarcts. CTA head and neck showed occluded right M3 MCA. PMH significant fro HTN, gout, arthritis, and anemia.  PRECAUTIONS: Fall- PT  is addressing  WEIGHT BEARING RESTRICTIONS: No  PAIN:  Are you having pain? No  FALLS: Has patient fallen in last 6 months? No  LIVING ENVIRONMENT: Lives with: lives with their family Lives in: House/apartment Stairs: Yes: Internal: flight steps; on left going up Has following equipment at home: Walker - 2 wheeled and bed side commode  PLOF: Independent  PATIENT GOALS: get back to prior level  OBJECTIVE:  Note: Objective measures were completed at Evaluation unless otherwise noted.  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, in Art therapist  Light housekeeping: Pt has attempted however she fatigues to quickly to complete Meal Prep: has not attempted Community mobility: mod I Medication management: partner assists Financial management: partner assists Handwriting: 100% legible and Increased time, pt would like to write faster  MOBILITY STATUS: mod I    ACTIVITY TOLERANCE: Activity tolerance: Decreased activity tolerance, able to stand for grossly 5 mins prior to rest  FUNCTIONAL OUTCOME MEASURES: Quick Dash: 30% disability  UPPER EXTREMITY ROM:  AROM WFLS    UPPER EXTREMITY MMT:     MMT Right eval Left eval  Shoulder flexion 4+/5 4/5  Shoulder abduction    Shoulder adduction    Shoulder extension    Shoulder internal rotation    Shoulder external rotation    Middle trapezius    Lower trapezius  Elbow flexion 4+/5 4/5  Elbow extension 4+/5 4-/5  Wrist flexion    Wrist extension    Wrist ulnar deviation    Wrist radial deviation    Wrist pronation    Wrist supination    (Blank rows = not tested)  HAND FUNCTION: Grip strength: Right: 55 lbs; Left: 19  lbs  COORDINATION: 9 Hole Peg test: Right: 31.50 sec; Left: 31.53 sec   SENSATION: WFL   COGNITION: Overall cognitive status: Impaired Short term memory deficits,     VISION ASSESSMENT: Not tested- denies changes     OBSERVATIONS: Pleasant female hwo would like to improve to prior level of function. BP: 145/114, checked 10 mins later 150/108- Pt is asymptomatic and forgot to take her meds.                                                                                                                              TREATMENT DATE: 07/01/23- eval          PATIENT EDUCATION: Education details: role of OT, potential goals, improtance of taking BP meds to manage BP Person educated: Patient Education method: Explanation Education comprehension: verbalized understanding  HOME EXERCISE PROGRAM: n/a   GOALS: Goals reviewed with patient? Yes  SHORT TERM GOALS: Target date: 07/30/23  I with inital HEP. Baseline:dependent Goal status: INITIAL  2.  Pt will perform dressing mod I Baseline: needs cues not to get clothes on backwards Goal status: INITIAL  3.  Pt will improve LUE grip by 5 lbs for increased ease with ADLS Baseline: see above Goal status: INITIAL  4.  Pt will demonstrate ability to stand x 15 min for functional activity prior to rest break Baseline: 10 mins max standing prior to rest Goal status: INITIAL  5.  Pt will verbalize understanding of memory compensation strategies. Baseline: dependent Goal status: INITIAL    LONG TERM GOALS: Target date: 09/23/23 (to account for scheduling)  I with updated HEP Baseline: dependent Goal status: INITIAL  2.  Pt will demonstrate improved LUE functional use as evidenced by improving Quick DASH to 25% or better disability Baseline: 30% disability Goal status: INITIAL  3.  Pt will dmeonstrate improved fine motor coordination as eveidnced by decreaing 9 hole peg test socre to 29 secs or less. Baseline: see  above Goal status: INITIAL  4.  Pt ill demonstrate ability to perfrom a physical and cognitve task simultaneously with 90% or better accuracy in prep for driving. Baseline: increased time required for Trailmaking B 57 secs  Goal status: INITIAL  5.  Pt will resume mod complex home management and cooking at a mod I level Baseline: unable due to endurance, has not fully attempted Goal status: INITIAL  6  ASSESSMENT:  CLINICAL IMPRESSION:  Anita [name] is a 33 y.o. female who presented to the ED 06/08/23 via EMS for evaluation of left-sided facial droop, slurred speech, left-sided weakness and numbness, and right-sided headache. Found  to have high BP 192/114. She has not taken any BP meds for almost a year. MRI brain showed multiple small acute right middle cerebral artery territory infarcts. CTA head and neck showed occluded right M3 MCA. PMH significant fro HTN, gout, arthritis, and anemia. Pt presents with the performance deficits below. She can benefit from skilled occupational therapy to address these deficits in order to maximize pt's safety and I with ADLs/ IADLs.   PERFORMANCE DEFICITS: in functional skills including ADLs, IADLs, coordination, dexterity, sensation, strength, Fine motor control, Gross motor control, balance, endurance, decreased knowledge of precautions, decreased knowledge of use of DME, and UE functional use, cognitive skills including attention, memory, and thought, and psychosocial skills including coping strategies, environmental adaptation, habits, interpersonal interactions, and routines and behaviors.   IMPAIRMENTS: are limiting patient from ADLs, IADLs, work, play, leisure, and social participation.   CO-MORBIDITIES: may have co-morbidities  that affects occupational performance. Patient will benefit from skilled OT to address above impairments and improve overall function.  MODIFICATION OR ASSISTANCE TO COMPLETE EVALUATION: No modification of tasks or  assist necessary to complete an evaluation.  OT OCCUPATIONAL PROFILE AND HISTORY: Detailed assessment: Review of records and additional review of physical, cognitive, psychosocial history related to current functional performance.  CLINICAL DECISION MAKING: LOW - limited treatment options, no task modification necessary  REHAB POTENTIAL: Good  EVALUATION COMPLEXITY: Low    PLAN:  OT FREQUENCY: 8 visits  OT DURATION: 10 weeks to account for scheduling  PLANNED INTERVENTIONS: 97168 OT Re-evaluation, 97535 self care/ADL training, 02889 therapeutic exercise, 97530 therapeutic activity, 97112 neuromuscular re-education, 97140 manual therapy, 97035 ultrasound, 97018 paraffin, 02989 moist heat, 97010 cryotherapy, 97129 Cognitive training (first 15 min), 02869 Cognitive training(each additional 15 min), passive range of motion, balance training, functional mobility training, energy conservation, coping strategies training, patient/family education, and DME and/or AE instructions  RECOMMENDED OTHER SERVICES: PT, ST  CONSULTED AND AGREED WITH PLAN OF CARE: Patient  PLAN FOR NEXT SESSION: inital HEP for LUE coordination, light theraband, monitor BP   Jayd Forrey, OT 07/14/2023, 4:02 PM

## 2023-07-15 ENCOUNTER — Ambulatory Visit: Attending: Internal Medicine | Admitting: Occupational Therapy

## 2023-07-15 ENCOUNTER — Ambulatory Visit: Admitting: Speech Pathology

## 2023-07-15 DIAGNOSIS — R41841 Cognitive communication deficit: Secondary | ICD-10-CM | POA: Insufficient documentation

## 2023-07-15 DIAGNOSIS — R2681 Unsteadiness on feet: Secondary | ICD-10-CM | POA: Insufficient documentation

## 2023-07-15 DIAGNOSIS — R41840 Attention and concentration deficit: Secondary | ICD-10-CM | POA: Insufficient documentation

## 2023-07-15 DIAGNOSIS — R41844 Frontal lobe and executive function deficit: Secondary | ICD-10-CM | POA: Insufficient documentation

## 2023-07-15 DIAGNOSIS — R278 Other lack of coordination: Secondary | ICD-10-CM | POA: Insufficient documentation

## 2023-07-15 DIAGNOSIS — M6281 Muscle weakness (generalized): Secondary | ICD-10-CM | POA: Insufficient documentation

## 2023-07-15 NOTE — Patient Instructions (Signed)
 Warning Signs of a Stroke A stroke is a medical emergency. It should be treated right away. A stroke happens when there is not enough blood flow to the brain. A stroke can lead to brain damage and death. But if a person gets treated right away, they have a better chance of surviving and recovering. It is very important to recognize the symptoms of a stroke. What types of strokes are there? There are two main types of strokes: Ischemic stroke. This is the most common type. It happens when a blood vessel that sends blood to the brain is blocked. Hemorrhagic stroke. This happens when there is bleeding in the brain. This may be from a blood vessel leaking or bursting. A transient ischemic attack (TIA) causes the same symptoms as a stroke. But the symptoms go away quickly and do not cause lasting damage to the brain. TIAs still need to be treated right away. They are also a sign that you are at higher risk for a stroke. What are the warning signs of a stroke? The symptoms of a stroke may differ based on the part of the brain that is involved. Symptoms often happen all of a sudden. "BE FAST" symptoms "BE FAST" is an easy way to remember the main warning signs of a stroke: B - Balance. Signs are dizziness, sudden trouble walking, or loss of balance. E - Eyes. Signs are trouble seeing or a sudden change in vision. F - Face. Signs are sudden weakness or numbness of the face, or the face or eyelid drooping on one side. A - Arms. Signs are weakness or numbness in an arm. This happens suddenly and usually on one side of the body. S - Speech. Signs are sudden trouble speaking, slurred speech, or trouble understanding what people say. T - Time. Time to call emergency services. Write down what time symptoms started. A stroke may be happening even if only one "BE FAST" symptom is present. Other signs of a stroke Some less common signs of a stroke include: A sudden, severe headache with no known cause. Nausea  or vomiting. Seizure. These symptoms may be an emergency. Get help right away. Call 911. Do not wait to see if the symptoms will go away. Do not drive yourself to the hospital.  This information is not intended to replace advice given to you by your health care provider. Make sure you discuss any questions you have with your health care provider. Document Revised: 10/06/2021 Document Reviewed: 10/06/2021 Elsevier Patient Education  2024 [employer].

## 2023-07-16 ENCOUNTER — Encounter: Payer: Self-pay | Admitting: Neurology

## 2023-07-16 ENCOUNTER — Ambulatory Visit: Attending: Cardiology

## 2023-07-16 DIAGNOSIS — I639 Cerebral infarction, unspecified: Secondary | ICD-10-CM

## 2023-07-16 DIAGNOSIS — I63511 Cerebral infarction due to unspecified occlusion or stenosis of right middle cerebral artery: Secondary | ICD-10-CM

## 2023-07-21 ENCOUNTER — Ambulatory Visit: Admitting: Physical Therapy

## 2023-07-21 ENCOUNTER — Ambulatory Visit: Admitting: Speech Pathology

## 2023-07-21 ENCOUNTER — Ambulatory Visit: Admitting: Occupational Therapy

## 2023-07-22 NOTE — Progress Notes (Signed)
 Initial neurology clinic note  Anita [name] MRN: [id number] DOB: 1990/12/09  Referring provider: Theotis Haze ORN, NP  Primary care provider: Theotis Haze ORN, NP  Reason for consult:  stroke  Subjective:  This is Ms. Anita [name], a 33 y.o. left-handed female with a medical history of HTN, HLD, anxiety, depression, OSA who presents to neurology clinic with stroke. The patient is alone today.  Patient presented to ED on 06/08/23 via EMS for left arm weakness, left facial droop, and left sensory deficit. She was at work and noticed symptoms acutely. Her speech was also off. On EMS arrival, her SBP was 220 (endorsed not taking BP medications properly). On arrival to ED with neuro evaluation, iNIHSS was 1 for LUE sensory deficit. CTH was normal. MRI brain showed multiple small acute right middle cerebral artery territory infarcts. CTA showed right M3 occlusion. LDL was 98, so atorvastatin  40 mg daily was added. DVT US  of LE was negative. Cardiolipin antibodies were minimally elevated, so repeat was recommended. ANA, sickle cell screen were negative. Echo (TEE) showed no PFO or clot and normal EF.  Patient was put on DAPT with asa 81 mg daily and Plavix  75 mg daily for 3 weeks with plan of aspirin  monotherapy thereafter (started 06/09/23). She should be on aspirin  81 mg daily alone now, but she had difficulty knowing what medications she was on when asked, so it is unclear if she is taking this.  She also tested positive in hospital for OSA. She tried CPAP. She is still using this nightly. Holter monitor was also ordered for patient. This was normal.  Patient was discharged home on 06/12/23.   Since discharge, patient thinks she is doing well. She denies any residual stroke symptoms. She is doing outpatient speech therapy, PT, and OT.  She is a current smoker (black and milds, 2 per day).   She has a cardiology appointment in 09/2023.  MEDICATIONS:  Outpatient Encounter Medications as of  07/28/2023  Medication Sig   amLODipine  (NORVASC ) 10 MG tablet Take 1 tablet (10 mg total) by mouth daily.   atorvastatin  (LIPITOR) 40 MG tablet Take 1 tablet (40 mg total) by mouth daily.   buPROPion  (WELLBUTRIN  XL) 150 MG 24 hr tablet Take 1 tablet (150 mg total) by mouth daily. For anxiety and depression   carvedilol  (COREG ) 12.5 MG tablet Take 1 tablet (12.5 mg total) by mouth 2 (two) times daily with a meal.   valsartan  (DIOVAN ) 160 MG tablet Take 1 tablet (160 mg total) by mouth daily.   aspirin  EC 81 MG tablet Take 1 tablet (81 mg total) by mouth daily. Swallow whole. (Patient not taking: Reported on 07/28/2023)   No facility-administered encounter medications on file as of 07/28/2023.    PAST MEDICAL HISTORY: Past Medical History:  Diagnosis Date   Anemia    Arthritis    Gout    Hypertension     PAST SURGICAL HISTORY: Past Surgical History:  Procedure Laterality Date   KNEE SURGERY     LEG SURGERY     TRANSESOPHAGEAL ECHOCARDIOGRAM (CATH LAB) N/A 06/10/2023   Procedure: TRANSESOPHAGEAL ECHOCARDIOGRAM;  Surgeon: Michele Richardson, DO;  Location: [facility];  Service: Cardiovascular;  Laterality: N/A;    ALLERGIES: No Known Allergies  FAMILY HISTORY: Family History  Problem Relation Age of Onset   Hypertension Mother    Aneurysm Father    Hypertension Sister    Alcohol abuse Neg Hx    Arthritis Neg Hx  Asthma Neg Hx    Birth defects Neg Hx    Cancer Neg Hx    COPD Neg Hx    Depression Neg Hx    Diabetes Neg Hx    Drug abuse Neg Hx    Early death Neg Hx    Hearing loss Neg Hx    Heart disease Neg Hx    Hyperlipidemia Neg Hx    Kidney disease Neg Hx    Learning disabilities Neg Hx    Mental illness Neg Hx    Mental retardation Neg Hx    Miscarriages / Stillbirths Neg Hx    Stroke Neg Hx    Vision loss Neg Hx     SOCIAL HISTORY: Social History   Tobacco Use   Smoking status: Every Day    Types: Cigars   Smokeless tobacco: Never   Tobacco  comments:    2 cigars a day  Vaping Use   Vaping status: Never Used  Substance Use Topics   Alcohol use: Yes    Comment: occas   Drug use: No   Social History   Social History Narrative   Are you right handed or left handed? Left   Are you currently employed ?    What is your current occupation?   Do you live at home alone?    Who lives with you? family   What type of home do you live in: 1 story or 2 story? Two    Caffiene none    Objective:  Vital Signs:  BP (!) 163/120 Comment: meds were just taken  Pulse 85   Ht 5' 6 (1.676 m)   Wt 238 lb (108 kg)   SpO2 99%   BMI 38.41 kg/m   General: No acute distress.  Patient appears well-groomed.   Head:  Normocephalic/atraumatic Neck: supple Heart: regular rate and rhythm Lungs: Clear to auscultation bilaterally. Vascular: No carotid bruits.  Neurological Exam: Mental status: alert and oriented, speech fluent and not dysarthric, language intact.  Cranial nerves: CN I: not tested CN II: pupils equal, round and reactive to light, visual fields intact CN III, IV, VI:  full range of motion, no nystagmus, no ptosis CN V: facial sensation intact. CN VII: upper and lower face symmetric CN VIII: hearing intact CN IX, X: uvula midline CN XI: sternocleidomastoid and trapezius muscles intact CN XII: tongue midline  Bulk & Tone: normal, no fasciculations. Motor:  muscle strength 5/5 throughout Deep Tendon Reflexes:  2+ throughout.   Sensation:  Pinprick sensation intact. Finger to nose testing:  Without dysmetria.    Gait:  Normal station and stride.   Labs and Imaging review: Internal labs: CMP (06/08/23) significant for glucose 111, Cr 1.09, AST 50, ALT 21  06/09/23: Lipid panel: tChol 169, LDL 98, TG 72 B12: 237 Ferritin: 12 ANA negative Cardiolipin antibodies: IgM elevated to 15 (indeterminate range) Sickle cell screen negative  HbA1c (06/10/23): 4.9 CBC w/ diff (07/07/23) significant for Hb 10.0, MCV  71  Imaging/Procedures: CTH w/ contrast (06/08/23): No evidence of an acute intracranial abnormality.   MRI brain wo contrast (06/08/23): Personally reviewed and agree with radiology findings below FINDINGS: Brain:   Cerebral volume is normal.   Small acute right middle cerebral artery territory cortical infarcts within the insula and at the posterior aspect of the sylvian fissure (for instance as seen on series 5, images 70 and 73).   Additional small patchy acute right MCA territory infarcts within the right frontoparietal white matter (series  5, images 76-78).   Background multifocal T2 FLAIR hyperintense signal abnormality within the cerebral white matter, mild but unexpected for age.   Expanded and partially empty sella turcica.   No evidence of an intracranial mass.   No chronic intracranial blood products.   No extra-axial fluid collection.   No midline shift.   Vascular: Maintained flow voids within the proximal large arterial vessels.   Skull and upper cervical spine: Abnormal T1 hypointense marrow signal within the calvarium and within visible portions of the cervical spine.   Sinuses/Orbits: No mass or acute finding within the imaged orbits. No significant paranasal sinus disease.   Impression #1 called by telephone at the time of interpretation on 06/08/2023 at 7:26 pm to provider ANDREW TEE , who verbally acknowledged these results.   IMPRESSION: 1. 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 2. Background T2 FLAIR hyperintense signal changes within the cerebral white matter, overall mild but unexpected for age. Findings are nonspecific and differential considerations include age-advanced chronic small vessel ischemic disease, sequelae of chronic migraine headaches, vasculitis, sequelae of a prior infectious/inflammatory process and demyelinating disease, among others. 3. Abnormal T1 hypointense marrow signal within the calvarium  and within visible portions of the cervical spine. While this finding can reflect a marrow infiltrative process, the most common causes include chronic anemia, smoking and obesity.  CTA head and neck (06/08/23): Personally reviewed and agree with radiology findings below FINDINGS: CTA NECK FINDINGS   Aortic arch: Great vessel origins are patent without significant stenosis.   Right carotid system: No evidence of dissection, stenosis (50% or greater), or occlusion.   Left carotid system: No evidence of dissection, stenosis (50% or greater), or occlusion.   Vertebral arteries: Codominant. No evidence of dissection, stenosis (50% or greater), or occlusion.   Skeleton: No acute abnormality on limited assessment.   Other neck: No acute abnormality on limited assessment.   Upper chest: Visualized lung apices are clear.   Review of the MIP images confirms the above findings   CTA HEAD FINDINGS   Anterior circulation: Bilateral intracranial and ACAs are patent without proximal hemodynamically significant stenosis. Left MCA is patent without proximal hemodynamically significant stenosis. Occluded right M3 MCA vessel (see series 5, image 92 and series 8, images 130 through 140).   Posterior circulation: Bilateral intradural vertebral arteries, basilar artery and bilateral posterior is are patent without proximal hemodynamically significant stenosis. Bilateral fetal type PCAs with small vertebrobasilar system, anatomic variant.   Venous sinuses: No evidence of dural venous sinus thrombosis.   Review of the MIP images confirms the above findings   IMPRESSION: 1. Occluded right M3 MCA, as described above. 2. Otherwise, no large vessel occlusion or proximal hemodynamically significant stenosis.  Echocardiogram (TEE - 06/10/23): 1. Left ventricular ejection fraction, by estimation, is 60 to 65%. The  left ventricle has normal function. The left ventricle has no regional  wall  motion abnormalities. Moderate to severe left ventricular  hypertrophy.   2. Right ventricular systolic function is normal. The right ventricular  size is normal.   3. No left atrial/left atrial appendage thrombus was detected. The LAA  emptying velocity was 60 cm/s.   4. A small pericardial effusion is present. The pericardial effusion is  circumferential. There is no evidence of cardiac tamponade.   5. The mitral valve is normal in structure. No evidence of mitral valve  regurgitation. No evidence of mitral stenosis.   6. The aortic valve is normal in structure. Aortic valve  regurgitation is  not visualized. No aortic stenosis is present.   7. The inferior vena cava is normal in size with greater than 50%  respiratory variability, suggesting right atrial pressure of 3 mmHg.   8. Agitated saline contrast bubble study was negative, with no evidence  of any interatrial shunt.   Conclusion(s)/Recommendation(s): No LA/LAA thrombus identified. Negative  bubble study for interatrial shunt. No intracardiac source of embolism  detected on this on this transesophageal echocardiogram.   Cardiac event monitor (06/15/23-07/14/23): No afib seen  Assessment/Plan:  Anita [name] is a 33 y.o. 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 Her original symptoms were left sided weakness, left sided numbness, and difficulty speaking. Fortunately, she has no current residual symptoms. Her MRI brain showed multiple very small, cortical strokes. CTA showed right M3 occlusion. She has a relevant medical history of HTN, HLD, anxiety, depression, OSA. Her neurological examination is normal today. The etiology of stroke is currently unclear. Risk factors for stroke include HTN, HLD, OSA, and current smoking. Her stroke looks more embolic than small vessel though, so hypercoag state or cardioembolic etiology should be considered as well. Her BP is very high today and she was uncertain of  medications when asked, so I also worry that she is taking taking medications as prescribed, if at all.  PLAN: -Blood work: Cardiolipin antibodies -See cardiology as planned. I would recommend loop recorder consideration for possible cardio embolic source of M3 occlusion -Continue aspirin  81 mg daily. Patient will make sure she is taking this and not still taking Plavix . -Continue atorvastatin  40 mg daily -Continue B12 supplementation -Work with PCP on good BP control -Continue CPAP for OSA -Discussed smoking cessation -Discussed stroke warning signs  -Return to clinic in 6 months  The impression above as well as the plan as outlined below were extensively discussed with the patient who voiced understanding. All questions were answered to their satisfaction.  When available, results of the above investigations and possible further recommendations will be communicated to the patient via telephone/MyChart. Patient to call office if not contacted after expected testing turnaround time.    Thank you for allowing me to participate in patient's care.  If I can answer any additional questions, I would be pleased to do so.  Venetia Potters, MD   CC: Theotis Haze ORN, NP [address] [place] [address] [place] [address]  CC: Referring provider: Theotis Haze ORN, NP [address] [place] [address] [place],  [place] [address]

## 2023-07-26 ENCOUNTER — Ambulatory Visit: Admitting: Speech Pathology

## 2023-07-26 ENCOUNTER — Ambulatory Visit

## 2023-07-26 ENCOUNTER — Encounter: Payer: Self-pay | Admitting: Speech Pathology

## 2023-07-26 ENCOUNTER — Ambulatory Visit: Admitting: Occupational Therapy

## 2023-07-26 ENCOUNTER — Other Ambulatory Visit: Payer: Self-pay

## 2023-07-26 DIAGNOSIS — R41844 Frontal lobe and executive function deficit: Secondary | ICD-10-CM | POA: Diagnosis not present

## 2023-07-26 DIAGNOSIS — M6281 Muscle weakness (generalized): Secondary | ICD-10-CM | POA: Diagnosis not present

## 2023-07-26 DIAGNOSIS — R41840 Attention and concentration deficit: Secondary | ICD-10-CM

## 2023-07-26 DIAGNOSIS — R2681 Unsteadiness on feet: Secondary | ICD-10-CM | POA: Diagnosis not present

## 2023-07-26 DIAGNOSIS — R41841 Cognitive communication deficit: Secondary | ICD-10-CM

## 2023-07-26 DIAGNOSIS — R278 Other lack of coordination: Secondary | ICD-10-CM

## 2023-07-26 NOTE — Patient Instructions (Signed)
  Strengthening: Resisted Flexion- face away from door   Hold tubing with __left___ arm(s) at side. Pull forward and up. Move shoulder through pain-free range of motion. Repeat __10__ times per set.  Do _1-2_ sessions per day , every other day   Strengthening: Resisted Extension   Hold tubing in ___left__ hand(s), arm forward. Pull arm back, elbow straight. Repeat _10___ times per set. Do _1-2___ sessions per day, every other day.   Resisted Horizontal Abduction: Bilateral   Sit or stand, tubing in both hands, arms out in front. Keeping arms straight, pinch shoulder blades together and stretch arms out. Repeat _10___ times per set. Do _1-2___ sessions per day, every other day.   Elbow Flexion: Resisted   With tubing held in ____left__ hand(s) and other end secured under foot, curl arm up as far as possible. Repeat _10___ times per set. Do _1-2___ sessions per day, every other day.    Elbow Extension: Resisted   Sit in chair with resistive band secured at armrest (or hold with other hand) and __left_____ elbow bent. Straighten elbow. Repeat _10___ times per set.  Do _1-2___ sessions per day, every other day.   Copyright  [employer]. All rights reserved.

## 2023-07-26 NOTE — Therapy (Addendum)
 OUTPATIENT OCCUPATIONAL THERAPY NEURO EVALUATION  Patient Name: Anita [name] MRN: [id number] DOB:11/13/1990, 33 y.o., female Today's Date: 07/26/2023  PCP: Theotis Ohm NP REFERRING PROVIDER: Dr. Patsy  END OF SESSION:  OT End of Session - 07/26/23 1109     Visit Number 2    Number of Visits 9    Date for OT Re-Evaluation 09/23/23    Authorization Type [employer]    Authorization Time Period 8 visits over 12 weeks    Authorization - Visit Number 1    OT Start Time 1107    OT Stop Time 1145    OT Time Calculation (min) 38 min          Past Medical History:  Diagnosis Date   Anemia    Arthritis    Gout    Hypertension    Past Surgical History:  Procedure Laterality Date   KNEE SURGERY     LEG SURGERY     TRANSESOPHAGEAL ECHOCARDIOGRAM (CATH LAB) N/A 06/10/2023   Procedure: TRANSESOPHAGEAL ECHOCARDIOGRAM;  Surgeon: Michele Richardson, DO;  Location: [facility];  Service: Cardiovascular;  Laterality: N/A;   Patient Active Problem List   Diagnosis Date Noted   Obesity (BMI 30-39.9) 06/09/2023   Acute ischemic right MCA stroke (HCC) 06/08/2023   Thyromegaly 07/23/2022   Tobacco use 07/23/2022   Uncontrolled hypertension 07/07/2022   Depo-Provera  contraceptive status 07/07/2022   Hypertensive emergency 03/19/2022   Elevated troponin 03/19/2022   Cardiomegaly 03/19/2022   Microcytic anemia 03/19/2022   Encounter for elective termination of pregnancy 10/12/2014    ONSET DATE: 06/10/23- referral date  REFERRING DIAG: Diagnosis  I63.9 (ICD-10-CM) - Acute ischemic stroke (HCC)    THERAPY DIAG:  Other lack of coordination  Muscle weakness (generalized)  Unsteadiness on feet  Attention and concentration deficit  Frontal lobe and executive function deficit  Rationale for Evaluation and Treatment: Rehabilitation  SUBJECTIVE:   SUBJECTIVE STATEMENT: Pt reports she forgot to take her BP meds today Pt accompanied by: self  PERTINENT HISTORY:  Anita [name] is a 33 y.o. female who presented to the ED 06/08/23 via EMS for evaluation of left-sided facial droop, slurred speech, left-sided weakness and numbness, and right-sided headache. Found to have high BP 192/114. She has not taken any BP meds for almost a year. MRI brain showed multiple small acute right middle cerebral artery territory infarcts. CTA head and neck showed occluded right M3 MCA. PMH significant fro HTN, gout, arthritis, and anemia.  PRECAUTIONS: Fall- PT is addressing  WEIGHT BEARING RESTRICTIONS: No  PAIN:  Are you having pain? No  FALLS: Has patient fallen in last 6 months? No  LIVING ENVIRONMENT: Lives with: lives with their family Lives in: House/apartment Stairs: Yes: Internal: flight steps; on left going up Has following equipment at home: Walker - 2 wheeled and bed side commode  PLOF: Independent  PATIENT GOALS: get back to prior level  OBJECTIVE:  Note: Objective measures were completed at Evaluation unless otherwise noted.  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, in Art therapist  Light housekeeping: Pt has attempted however she fatigues to quickly to complete Meal Prep: has not attempted Community mobility: mod I Medication management: partner assists Financial management: partner assists Handwriting:  100% legible and Increased time, pt would like to write faster  MOBILITY STATUS: mod I    ACTIVITY TOLERANCE: Activity tolerance: Decreased activity tolerance, able to stand for grossly 5 mins prior to rest  FUNCTIONAL OUTCOME MEASURES: Quick Dash: 30% disability  UPPER EXTREMITY ROM:  AROM WFLS    UPPER EXTREMITY MMT:     MMT Right eval Left eval   Shoulder flexion 4+/5 4/5  Shoulder abduction    Shoulder adduction    Shoulder extension    Shoulder internal rotation    Shoulder external rotation    Middle trapezius    Lower trapezius    Elbow flexion 4+/5 4/5  Elbow extension 4+/5 4-/5  Wrist flexion    Wrist extension    Wrist ulnar deviation    Wrist radial deviation    Wrist pronation    Wrist supination    (Blank rows = not tested)  HAND FUNCTION: Grip strength: Right: 55 lbs; Left: 19 lbs  COORDINATION: 9 Hole Peg test: Right: 31.50 sec; Left: 31.53 sec   SENSATION: WFL   COGNITION: Overall cognitive status: Impaired Short term memory deficits,     VISION ASSESSMENT: Not tested- denies changes     OBSERVATIONS: Pleasant female hwo would like to improve to prior level of function. BP: 145/114, checked 10 mins later 150/108- Pt is asymptomatic and forgot to take her meds.                                                                                                                              TREATMENT DATE:07/26/23- Pt's BP was monitored in ST today prior to OT and a little lower than previous. Pt was instucted in red theraband HEP, 10 reps each for LUE strengthening, min v.c and demonstration BP after this task 138/102. Following short rest break BP 137/96. Pt was encouraged to purchase a BP cuff for home.  Gripper set at level 3 to pick up 1 inch blocks with LUE for sustained grip, min difficulty. Therapist checked several short term goals.    07/01/23- eval          PATIENT EDUCATION: Education details: , red theraband HEP, importance of taking BP meds to manage BP, memory strategies Person educated: Patient Education method: Explanation, demonstration, v.c Education comprehension: verbalized understanding, returned demonstration  HOME EXERCISE PROGRAM: red theraband -07/26/23   GOALS: Goals reviewed with patient? Yes  SHORT TERM GOALS: Target date: 07/30/23  I with inital  HEP. Baseline:dependent Goal status: INITIAL  2.  Pt will perform dressing mod I Baseline: needs cues not to get clothes on backwards Goal status: met, 07/26/23  3.  Pt will improve LUE grip by 5 lbs for increased ease with ADLS Baseline: see above Goal status: met, 80lbs 07/26/23  4.  Pt will demonstrate ability to stand x 15 min for functional activity prior to rest break Baseline: 10 mins max standing prior to rest Goal status: INITIAL  5.  Pt will verbalize understanding of memory compensation strategies. Baseline: dependent Goal status:met, pt is using callendar, reminders and writing down prn. 07/26/23    LONG TERM GOALS: Target date: 09/23/23 (to account for scheduling)  I with updated HEP Baseline: dependent Goal status: INITIAL  2.  Pt will demonstrate improved LUE functional use as evidenced by improving Quick DASH to 25% or better disability Baseline: 30% disability Goal status: INITIAL  3.  Pt will dmeonstrate improved fine motor coordination as eveidnced by decreaing 9 hole peg test socre to 29 secs or less. Baseline: see above Goal status: met, 28.79 secs 07/26/23  4.  Pt ill demonstrate ability to perfrom a physical and cognitve task simultaneously with 90% or better accuracy in prep for driving. Baseline: increased time required for Trailmaking B 57 secs  Goal status: INITIAL  5.  Pt will resume mod complex home management and cooking at a mod I level Baseline: unable due to endurance, has not fully attempted Goal status: INITIAL  6  ASSESSMENT:  CLINICAL IMPRESSION:  Pt is progressing towards goals. she has achieved several short term goals. she demosntrates improved LUE strength and coordination. Pt reports missing BP meds for sveral days but she took her meds today. Therapist reinforced importance of taking BP meds to avoid a future CVA.   PERFORMANCE DEFICITS: in functional skills including ADLs, IADLs, coordination, dexterity, sensation,  strength, Fine motor control, Gross motor control, balance, endurance, decreased knowledge of precautions, decreased knowledge of use of DME, and UE functional use, cognitive skills including attention, memory, and thought, and psychosocial skills including coping strategies, environmental adaptation, habits, interpersonal interactions, and routines and behaviors.   IMPAIRMENTS: are limiting patient from ADLs, IADLs, work, play, leisure, and social participation.   CO-MORBIDITIES: may have co-morbidities  that affects occupational performance. Patient will benefit from skilled OT to address above impairments and improve overall function.  MODIFICATION OR ASSISTANCE TO COMPLETE EVALUATION: No modification of tasks or assist necessary to complete an evaluation.  OT OCCUPATIONAL PROFILE AND HISTORY: Detailed assessment: Review of records and additional review of physical, cognitive, psychosocial history related to current functional performance.  CLINICAL DECISION MAKING: LOW - limited treatment options, no task modification necessary  REHAB POTENTIAL: Good  EVALUATION COMPLEXITY: Low    PLAN:  OT FREQUENCY: 8 visits  OT DURATION: 10 weeks to account for scheduling  PLANNED INTERVENTIONS: 97168 OT Re-evaluation, 97535 self care/ADL training, 02889 therapeutic exercise, 97530 therapeutic activity, 97112 neuromuscular re-education, 97140 manual therapy, 97035 ultrasound, 97018 paraffin, 02989 moist heat, 97010 cryotherapy, 97129 Cognitive training (first 15 min), 02869 Cognitive training(each additional 15 min), passive range of motion, balance training, functional mobility training, energy conservation, coping strategies training, patient/family education, and DME and/or AE instructions  RECOMMENDED OTHER SERVICES: PT, ST  CONSULTED AND AGREED WITH PLAN OF CARE: Patient  PLAN FOR NEXT SESSION: review light theraband, monitor BP, multi tasking, standing tolerance, schedule 1 visit vs.  d/c   Jeremie Abdelaziz, OT 07/26/2023, 11:10 AM

## 2023-07-26 NOTE — Therapy (Signed)
 OUTPATIENT PHYSICAL THERAPY LOWER EXTREMITY EVALUATION   Patient Name: Anita [name] MRN: [id number] DOB:02-03-1990, 33 y.o., female Today's Date: 07/26/2023  END OF SESSION:  PT End of Session - 07/26/23 0941     Visit Number 2    Date for PT Re-Evaluation 07/29/23    Authorization Type [employer]    Authorization Time Period 07/01/23 to 08/12/23    Authorization - Number of Visits 27    Progress Note Due on Visit 10    PT Start Time 0932    PT Stop Time 1013    PT Time Calculation (min) 41 min    Activity Tolerance Patient tolerated treatment well;Treatment limited secondary to medical complications (Comment)    Behavior During Therapy [facility] for tasks assessed/performed;Flat affect          Past Medical History:  Diagnosis Date   Anemia    Arthritis    Gout    Hypertension    Past Surgical History:  Procedure Laterality Date   KNEE SURGERY     LEG SURGERY     TRANSESOPHAGEAL ECHOCARDIOGRAM (CATH LAB) N/A 06/10/2023   Procedure: TRANSESOPHAGEAL ECHOCARDIOGRAM;  Surgeon: Michele Richardson, DO;  Location: [facility] INVAS[facility];  Service: Cardiovascular;  Laterality: N/A;   Patient Active Problem List   Diagnosis Date Noted   Obesity (BMI 30-39.9) 06/09/2023   Acute ischemic right MCA stroke (HCC) 06/08/2023   Thyromegaly 07/23/2022   Tobacco use 07/23/2022   Uncontrolled hypertension 07/07/2022   Depo-Provera  contraceptive status 07/07/2022   Hypertensive emergency 03/19/2022   Elevated troponin 03/19/2022   Cardiomegaly 03/19/2022   Microcytic anemia 03/19/2022   Encounter for elective termination of pregnancy 10/12/2014    PCP: Theotis Ohm NP   REFERRING PROVIDER: Patsy Lenis, MD  REFERRING DIAG: I63.9 (ICD-10-CM) - Acute ischemic stroke [facility])  THERAPY DIAG:  Other lack of coordination  Muscle weakness (generalized)  Unsteadiness on feet  Rationale for Evaluation and Treatment: Rehabilitation  ONSET DATE: 06/08/23  SUBJECTIVE:   SUBJECTIVE  STATEMENT: I feel good, took my BP meds this am, I've been walking to and from the convenience store   Had my stroke in early June, they think it was from high blood pressure. Didn't check BP this morning, also didn't take BP meds.  Getting anxiety when in buildings and cars and things, can be hard to stand in one place for a long time. Legs can get weak and I'll have to sit down. No concerns about falling/stumbling/tripping. Words aren't coming to me when I'm talking now, I have to think about what I'm saying.    PERTINENT HISTORY: See above  PAIN:  Are you having pain? No   PRECAUTIONS: None  RED FLAGS: None   WEIGHT BEARING RESTRICTIONS: No  FALLS:  Has patient fallen in last 6 months? 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  DIAGNOSTIC FINDINGS: CLINICAL DATA:  Provided history: Neuro deficit, acute, stroke suspected.   EXAM: MRI HEAD WITHOUT CONTRAST   TECHNIQUE: Multiplanar, multiecho pulse sequences of the brain  and surrounding structures were obtained without intravenous contrast.   COMPARISON:  Non-contrast head CT performed earlier today 07/05/2023.   FINDINGS: Brain:   Cerebral volume is normal.   Small acute right middle cerebral artery territory cortical infarcts within the insula and at the posterior aspect of the sylvian fissure (for instance as seen on series 5, images 70 and 73).   Additional small patchy acute right MCA territory infarcts within the right  frontoparietal white matter (series 5, images 76-78).   Background multifocal T2 FLAIR hyperintense signal abnormality within the cerebral white matter, mild but unexpected for age.   Expanded and partially empty sella turcica.   No evidence of an intracranial mass.   No chronic intracranial blood products.   No extra-axial fluid collection.   No midline shift.   Vascular: Maintained flow voids within the proximal large arterial vessels.   Skull and upper cervical spine: Abnormal T1 hypointense marrow signal within the calvarium and within visible portions of the cervical spine.   Sinuses/Orbits: No mass or acute finding within the imaged orbits. No significant paranasal sinus disease.   Impression #1 called by telephone at the time of interpretation on 06/08/2023 at 7:26 pm to provider ANDREW TEE , who verbally acknowledged these results.   IMPRESSION: 1. 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 2. Background T2 FLAIR hyperintense signal changes within the cerebral white matter, overall mild but unexpected for age. Findings are nonspecific and differential considerations include age-advanced chronic small vessel ischemic disease, sequelae of chronic migraine headaches, vasculitis, sequelae of a prior infectious/inflammatory process and demyelinating disease, among others. 3. Abnormal T1 hypointense marrow signal within the calvarium and within visible portions of the cervical spine. While this finding can reflect a marrow infiltrative process, the most common causes include chronic anemia, smoking and obesity.    Lower Venous DVT Study   Patient Name:  Anita [name]  Date of Exam:   06/11/2023  Medical Rec #: [id number]        Accession #:    [id number]  Date of Birth: 1990/08/15        Patient Gender: F  Patient Age:   32 years  Exam Location:  [facility]  Procedure:      VAS US  LOWER EXTREMITY VENOUS (DVT)  Referring Phys:  PRAMOD SETHI    ---------------------------------------------------------------------------  -----    Indications: Stroke.    Comparison Study: No previous exams   Performing Technologist: Jody Hill RVT, RDMS     Examination Guidelines:  A complete evaluation includes B-mode imaging, spectral Doppler, color  Doppler,  and power Doppler as needed of all accessible portions of each vessel.  Bilateral  testing is considered an integral part of a complete examination. Limited  examinations for reoccurring indications may be performed as noted. The  reflux  portion of the exam is performed with the patient in reverse  Trendelenburg.      +---------+---------------+---------+-----------+----------+--------------+   RIGHT   CompressibilityPhasicitySpontaneityPropertiesThrombus  Aging  +---------+---------------+---------+-----------+----------+--------------+   CFV     Full           Yes      Yes                                   +---------+---------------+---------+-----------+----------+--------------+   SFJ     Full                                                          +---------+---------------+---------+-----------+----------+--------------+  FV Prox  Full           Yes      Yes                                   +---------+---------------+---------+-----------+----------+--------------+   FV Mid   Full           Yes      Yes                                   +---------+---------------+---------+-----------+----------+--------------+   FV DistalFull           Yes      Yes                                   +---------+---------------+---------+-----------+----------+--------------+   PFV     Full                                                          +---------+---------------+---------+-----------+----------+--------------+   POP     Full           Yes      Yes                                    +---------+---------------+---------+-----------+----------+--------------+   PTV     Full                                                          +---------+---------------+---------+-----------+----------+--------------+   PERO    Full                                                          +---------+---------------+---------+-----------+----------+--------------+          +---------+---------------+---------+-----------+----------+--------------+   LEFT    CompressibilityPhasicitySpontaneityPropertiesThrombus  Aging  +---------+---------------+---------+-----------+----------+--------------+   CFV     Full           Yes      Yes                                   +---------+---------------+---------+-----------+----------+--------------+   SFJ     Full                                                          +---------+---------------+---------+-----------+----------+--------------+   FV Prox  Full           Yes      Yes                                   +---------+---------------+---------+-----------+----------+--------------+  FV Mid   Full           Yes      Yes                                   +---------+---------------+---------+-----------+----------+--------------+   FV DistalFull           Yes      Yes                                   +---------+---------------+---------+-----------+----------+--------------+   PFV     Full                                                          +---------+---------------+---------+-----------+----------+--------------+   POP     Full           Yes      Yes                                   +---------+---------------+---------+-----------+----------+--------------+   PTV     Full                                                          +---------+---------------+---------+-----------+----------+--------------+   PERO    Full                                                           +---------+---------------+---------+-----------+----------+--------------+               Summary:  BILATERAL:  - No evidence of deep vein thrombosis seen in the lower extremities,  bilaterally.  -No evidence of popliteal cyst, bilaterally.    PATIENT SURVEYS:   ABC 78.1%  COGNITION: Overall cognitive status: Within functional limits for tasks assessed         LOWER EXTREMITY MMT:  MMT Right eval Left eval  Hip flexion    Hip extension    Hip abduction    Hip adduction    Hip internal rotation    Hip external rotation    Knee flexion    Knee extension    Ankle dorsiflexion    Ankle plantarflexion    Ankle inversion    Ankle eversion     (Blank rows = not tested)  MMT deferred at eval due to elevated diastolic BP in context of recent CVA     FUNCTIONAL TESTS:   Eval:   SLS 30 seconds L, 30 seconds R but knee hyperextension and noted LE instability  Tandem stance 30 seconds B   GAIT: Distance walked: 144ft Assistive device utilized: None Level of assistance: Complete Independence Comments: noted proximal weakness with B knee valgus, trendelenburg, slow pace of gait with trunk/core weakness  TREATMENT DATE:  07/26/23: initial  BP 149/109 After 5 min: BP 138/101 Walked x 3  min 30 sec outdoors, on sidewalk, asphalt, mild incline, curb, I , no fatigue or SOB noted.  No balance loss noted Assessed for LE strength, MMT WNL B LE'S After walk outdoors: BP 146/109 In ll bars for standing heel/toe rocks on airex In ll bars for tandem standing on airex 30 sec holds, added horizontal head turns, 15 reps each In ll bars for tandem walking length of bars x 3  End of session BP  154/108  07/01/23  BP #1 136/105 HR 72 BP #2 141/104 HR 79  Eval, POC, education as below        PATIENT EDUCATION:  Education details: exam findings, POC, importance of good BP management- not able to push too hard today due to elevated diastolic BP especially in context of recent CVA, please take meds prior to therapy sessions so we can safely work with her  Person educated: Patient Education method: Explanation Education comprehension: verbalized understanding  HOME EXERCISE PROGRAM: 2nd session   ASSESSMENT:  CLINICAL IMPRESSION: Patient is a 33 y.o. F who participated today in  physical therapy treatment for I63.9 (ICD-10-CM) - Acute ischemic stroke (HCC). Of note BP was high again today especially diastolic. Monitored throughout.  She did not have any other concerning symptoms such as headache or shortness of breath.  She demonstrated good endurance, balance , and her strength testing today was WNL.  Did advise her to seek medical appt with PCP if BP continues to remain elevated.  She has met most of her goals so cancelled remaining PT visits so that she may focus on speech and OT.   OBJECTIVE IMPAIRMENTS: Abnormal gait, decreased activity tolerance, decreased balance, decreased coordination, decreased mobility, difficulty walking, decreased strength, and decreased safety awareness.   ACTIVITY LIMITATIONS: standing, stairs, transfers, and locomotion level  PARTICIPATION LIMITATIONS: meal prep, cleaning, laundry, shopping, community activity, and occupation  PERSONAL FACTORS: Behavior pattern, Education, Fitness, Past/current experiences, Social background, and Time since onset of injury/illness/exacerbation are also affecting patient's functional outcome.   REHAB POTENTIAL: Good  CLINICAL DECISION MAKING: Stable/uncomplicated  EVALUATION COMPLEXITY: Low   GOALS: Goals reviewed with patient? No  SHORT TERM GOALS: Target date: 07/15/2023   Will be compliant with appropriate progressive HEP  Baseline: Goal status: INITIAL  2.  Will be able to teach back on  importance of good BP control especially after recent CVA  Baseline:  Goal status: INITIAL    LONG TERM GOALS: Target date: 07/29/2023    MMT to be at least 4+/5 all tested groups  Baseline:  Goal status: 07/26/23: met  2.  Will score at least 22 on DGI  Baseline:  Goal status: NT today  3.  Will be able to ambulate at least 574ft in to show improved community access Baseline:  Goal status: 07/26/23: met today  4.  ABC score to have improved by at least 10 points  Baseline:  Goal status: INITIAL 07/26/23: not met     PLAN:  PT FREQUENCY: 1-2x/week  PT DURATION: 4 weeks  PLANNED INTERVENTIONS: 97750- Physical Performance Testing, 97110-Therapeutic exercises, 97530- Therapeutic activity, 97112- Neuromuscular re-education, 97535- Self Care, [id number]- Manual therapy, and 97116- Gait training  PLAN FOR NEXT SESSION: Check BP. Still needs HEP. Otherwise strength, balance, functional activity tolerance if BP is WNL   Josette Rough, PT, DPT 07/26/23 2:33 PM

## 2023-07-26 NOTE — Therapy (Signed)
 OUTPATIENT SPEECH LANGUAGE PATHOLOGY EVALUATION   Patient Name: Anita [name] MRN: [id number] DOB:1990-05-12, 33 y.o., female Today's Date: 07/26/2023  PCP: Anita [name] ORN, NP REFERRING PROVIDER: Patsy Lenis, MD  END OF SESSION:  End of Session - 07/26/23 1220     Visit Number 2    Number of Visits 7    Date for SLP Re-Evaluation 08/31/23    SLP Start Time 1015    SLP Stop Time  1100    SLP Time Calculation (min) 45 min    Activity Tolerance Patient tolerated treatment well           Past Medical History:  Diagnosis Date   Anemia    Arthritis    Gout    Hypertension    Past Surgical History:  Procedure Laterality Date   KNEE SURGERY     LEG SURGERY     TRANSESOPHAGEAL ECHOCARDIOGRAM (CATH LAB) N/A 06/10/2023   Procedure: TRANSESOPHAGEAL ECHOCARDIOGRAM;  Surgeon: Anita Richardson, DO;  Location: [facility] INVASIVE CV [facility];  Service: Cardiovascular;  Laterality: N/A;   Patient Active Problem List   Diagnosis Date Noted   Obesity (BMI 30-39.9) 06/09/2023   Acute ischemic right MCA stroke (HCC) 06/08/2023   Thyromegaly 07/23/2022   Tobacco use 07/23/2022   Uncontrolled hypertension 07/07/2022   Depo-Provera  contraceptive status 07/07/2022   Hypertensive emergency 03/19/2022   Elevated troponin 03/19/2022   Cardiomegaly 03/19/2022   Microcytic anemia 03/19/2022   Encounter for elective termination of pregnancy 10/12/2014    ONSET DATE: referred on 06/10/23  REFERRING DIAG: I63.9 (ICD-10-CM) - Acute ischemic stroke (HCC)   THERAPY DIAG:  Cognitive communication deficit  Rationale for Evaluation and Treatment: Rehabilitation  SUBJECTIVE:   SUBJECTIVE STATEMENT:  Pt reports going through phases where she does not want to take her medication because she just wants to feel normal. SLP advised pt to remain consistent with her medication and encouraged finding a counselor.  Pt accompanied by: self  PERTINENT HISTORY: Per chart review, 33 year old female with PMH  HTN, anemia, arthritis, gout, CVA (06/2023)   PAIN:  Are you having pain? No  FALLS: Has patient fallen in last 6 months?  No  LIVING ENVIRONMENT: Lives with: lives with their family; boyfriend ([name]); 2 kids ([name] and [name])  Lives in: House/apartment  PLOF:  Level of assistance: Independent with ADLs, Independent with IADLs Employment: Full-time employment; Location manager @ [employer]  PATIENT GOALS: memory, word finding  OBJECTIVE:  Note: Objective measures were completed at Evaluation unless otherwise noted.  DIAGNOSTIC FINDINGS: Per chart review:   MR BRAIN WO CONTRAST 06/08/2023  IMPRESSION: 1. 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 2. Background T2 FLAIR hyperintense signal changes within the cerebral white matter, overall mild but unexpected for age. Findings are nonspecific and differential considerations include age-advanced chronic small vessel ischemic disease, sequelae of chronic migraine headaches, vasculitis, sequelae of a prior infectious/inflammatory process and demyelinating disease, among others. 3. Abnormal T1 hypointense marrow signal within the calvarium and within visible portions of the cervical spine. While this finding can reflect a marrow infiltrative process, the most common causes include chronic anemia, smoking and obesity.     Electronically Signed   By: Anita [name] D.O.   On: 06/08/2023 19:28  COGNITION: Overall cognitive status: Impaired Areas of impairment:  Attention: Impaired: Alternating, Divided Memory: Impaired: Working Proofreader function: Impaired: Problem solving, Organization, Planning, and Slow processing Functional deficits:   COGNITIVE COMMUNICATION: Following directions: Follows multi-step commands with  increased time  Auditory comprehension: WFL Verbal expression: WFL Functional communication: Impaired: Requiring several repetitions of conversations    ORAL MOTOR EXAMINATION: Overall status: WFL Comments:   STANDARDIZED ASSESSMENTS: Initiated CLQT; to complete next session     Cognitive Linguistic Quick Test: AGE - 18 - 69   The Cognitive Linguistic Quick Test (CLQT) was administered to assess the relative status of five cognitive domains: attention, memory, language, executive functioning, and visuospatial skills. Scores from 10 tasks were used to estimate severity ratings (standardized for age groups 18-69 years and 70-89 years) for each domain, a clock drawing task, as well as an overall composite severity rating of cognition.       Task Score Criterion Cut Scores  Personal Facts 8/8 8  Symbol Cancellation 12/12 11  Confrontation Naming 10/10 10  Clock Drawing  13/13 12  Story Retelling 5/10 6  Symbol Trails 4/10 9  Generative Naming 7/9 5  Design Memory 6/6 5  Mazes  8/8 7  Design Generation 4/13 6     Cognitive Domain Composite Score Severity Rating  Attention 178/215 Mild  Memory 153/185 Mild  Executive Function 23/40 Mild  Language 30/37 WNL  Visuospatial Skills 84/105 WNL  Clock Drawing  13/13 WNL  Composite Severity Rating  Mild            PATIENT REPORTED OUTCOME MEASURES (PROM): To complete in subsequent sessions                                                                                                                             TREATMENT DATE:   07/26/23: Pt was seen for skilled ST services targeting continued assessment. Pt has not been seen in one month. She reports re: still taking longer to get thoughts out, [name] still having to repeat himself for her to recall details, and trouble finding words. Pt reports missing 3 days of meds. She reports she just wants to be normal and not take meds at all. She also shares her mood has improved- still having days where she is feeling depressed and anxious. Random bouts of crying have resolved. Pt completed CLQT - see scores above. SLP provided  education on relaxation strategies via Relaxation After BI handout. To begin attention strategies next session.   PATIENT EDUCATION: Education details: SLP role in cognitive-communication Person educated: Patient Education method: Explanation Education comprehension: verbalized understanding and needs further education   GOALS: Goals reviewed with patient? No  SHORT TERM GOALS: Target date: 07/31/23  Complete CLQT, PROMs, and update goals  Baseline: Goal status: MET  2.  Pt will verbalize 2 attention strategies to support active listening skills.  Baseline:  Goal status: INITIAL  3.  Pt will verbalize 2 memory strategies to support recall of information.  Baseline:  Goal status: INITIAL    LONG TERM GOALS: Target date: 08/31/23  Improve score on PROMs  Baseline:  Goal status: INITIAL  2.  Pt will report successful use  of strategies at home and in the community.  Baseline:  Goal status: INITIAL    ASSESSMENT:  CLINICAL IMPRESSION: Pt is a 33 yo female who presents to ST OP for evaluation post CVA. Pt endorses re: feeling slowed down - taking longer to get thoughts out; her boyfriend ([name]) having to repeat himself several times for pt to recall details from conversation; trouble finding words. She reports anxiety at baseline; however, reports feeling depressed and having sudden onset of crying. Pt denied thoughts of self-harm. SLP encouraged pt to talk to doctor at her next appointment about options. SLP to provide relaxation strategies next session. Pt was assessed using CLQT - to complete next session. SLP observed slowed processing and repetition of directions. SLP rec skilled ST services to address cognitive-communication impairment to maximize functional communication and increase quality of life.     OBJECTIVE IMPAIRMENTS: include attention, memory, and executive functioning. These impairments are limiting patient from return to work, managing medications, managing  appointments, managing finances, household responsibilities, and effectively communicating at home and in community. Factors affecting potential to achieve goals and functional outcome are NA.SABRA Patient will benefit from skilled SLP services to address above impairments and improve overall function.  REHAB POTENTIAL: Good  PLAN:  SLP FREQUENCY: 1x/week  SLP DURATION: 8 weeks  PLANNED INTERVENTIONS: Environmental controls, Cueing hierachy, Internal/external aids, Functional tasks, SLP instruction and feedback, Compensatory strategies, Patient/family education, and [id number] Treatment of speech (30 or 45 min)     [employer], CCC-SLP 07/26/2023, 12:21 PM

## 2023-07-26 NOTE — Therapy (Signed)
 OUTPATIENT PHYSICAL THERAPY LOWER EXTREMITY EVALUATION   Patient Name: Anita [name] MRN: [id number] DOB:July 21, 1990, 33 y.o., female Today's Date: 07/26/2023  END OF SESSION:    Past Medical History:  Diagnosis Date   Anemia    Arthritis    Gout    Hypertension    Past Surgical History:  Procedure Laterality Date   KNEE SURGERY     LEG SURGERY     TRANSESOPHAGEAL ECHOCARDIOGRAM (CATH LAB) N/A 06/10/2023   Procedure: TRANSESOPHAGEAL ECHOCARDIOGRAM;  Surgeon: Michele Richardson, DO;  Location: [facility];  Service: Cardiovascular;  Laterality: N/A;   Patient Active Problem List   Diagnosis Date Noted   Obesity (BMI 30-39.9) 06/09/2023   Acute ischemic right MCA stroke (HCC) 06/08/2023   Thyromegaly 07/23/2022   Tobacco use 07/23/2022   Uncontrolled hypertension 07/07/2022   Depo-Provera  contraceptive status 07/07/2022   Hypertensive emergency 03/19/2022   Elevated troponin 03/19/2022   Cardiomegaly 03/19/2022   Microcytic anemia 03/19/2022   Encounter for elective termination of pregnancy 10/12/2014    PCP: Theotis Ohm NP   REFERRING PROVIDER: Patsy Lenis, MD  REFERRING DIAG: I63.9 (ICD-10-CM) - Acute ischemic stroke (HCC)  THERAPY DIAG:  No diagnosis found.  Rationale for Evaluation and Treatment: Rehabilitation  ONSET DATE: 06/08/23  SUBJECTIVE:   SUBJECTIVE STATEMENT:  Had my stroke in early June, they think it was from high blood pressure. Didn't check BP this morning, also didn't take BP meds.  Getting anxiety when in buildings and cars and things, can be hard to stand in one place for a long time. Legs can get weak and I'll have to sit down. No concerns about falling/stumbling/tripping. Words aren't coming to me when I'm talking now, I have to think about what I'm saying.    PERTINENT HISTORY: See above  PAIN:  Are you having pain? No   PRECAUTIONS: None  RED FLAGS: None   WEIGHT BEARING RESTRICTIONS: No  FALLS:  Has patient fallen in  last 6 months? 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  DIAGNOSTIC FINDINGS: CLINICAL DATA:  Provided history: Neuro deficit, acute, stroke suspected.   EXAM: MRI HEAD WITHOUT CONTRAST   TECHNIQUE: Multiplanar, multiecho pulse sequences of the brain and surrounding structures were obtained without intravenous contrast.   COMPARISON:  Non-contrast head CT performed earlier today 07/05/2023.   FINDINGS: Brain:   Cerebral volume is normal.   Small acute right middle cerebral artery territory cortical infarcts within the insula and at the posterior aspect of the sylvian fissure (for instance as seen on series 5, images 70 and 73).   Additional small patchy acute right MCA territory infarcts within the right frontoparietal white matter (series 5, images 76-78).   Background multifocal T2 FLAIR hyperintense signal abnormality within the cerebral white matter, mild but unexpected for age.   Expanded and partially empty sella turcica.   No evidence of an intracranial mass.   No chronic intracranial blood products.   No extra-axial fluid collection.   No midline shift.   Vascular: Maintained  flow voids within the proximal large arterial vessels.   Skull and upper cervical spine: Abnormal T1 hypointense marrow signal within the calvarium and within visible portions of the cervical spine.   Sinuses/Orbits: No mass or acute finding within the imaged orbits. No significant paranasal sinus  disease.   Impression #1 called by telephone at the time of interpretation on 06/08/2023 at 7:26 pm to provider ANDREW TEE , who verbally acknowledged these results.   IMPRESSION: 1. 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 2. Background T2 FLAIR hyperintense signal changes within the cerebral white matter, overall mild but unexpected for age. Findings are nonspecific and differential considerations include age-advanced chronic small vessel ischemic disease, sequelae of chronic migraine headaches, vasculitis, sequelae of a prior infectious/inflammatory process and demyelinating disease, among others. 3. Abnormal T1 hypointense marrow signal within the calvarium and within visible portions of the cervical spine. While this finding can reflect a marrow infiltrative process, the most common causes include chronic anemia, smoking and obesity.    Lower Venous DVT Study   Patient Name:  Anita [name]  Date of Exam:   06/11/2023  Medical Rec #: [id number]        Accession #:    [id number]  Date of Birth: 1990-11-24        Patient Gender: F  Patient Age:   32 years  Exam Location[facility]  [facility]  Procedure:      VAS US  LOWER EXTREMITY VENOUS (DVT)  Referring Phys: PRAMOD SETHI    ---------------------------------------------------------------------------  -----    Indications: Stroke.    Comparison Study: No previous exams   Performing Technologist: Jody Hill RVT, RDMS     Examination Guidelines:  A complete evaluation includes B-mode imaging, spectral Doppler, color  Doppler,  and power Doppler as needed of all accessible portions of each vessel.  Bilateral  testing is considered an integral part of a complete examination. Limited  examinations for reoccurring indications may be performed as noted. The  reflux  portion of the exam is performed with the patient in reverse  Trendelenburg.       +---------+---------------+---------+-----------+----------+--------------+   RIGHT   CompressibilityPhasicitySpontaneityPropertiesThrombus  Aging  +---------+---------------+---------+-----------+----------+--------------+   CFV     Full           Yes      Yes                                   +---------+---------------+---------+-----------+----------+--------------+   SFJ     Full                                                          +---------+---------------+---------+-----------+----------+--------------+   FV Prox  Full           Yes      Yes                                   +---------+---------------+---------+-----------+----------+--------------+   FV Mid   Full           Yes      Yes                                   +---------+---------------+---------+-----------+----------+--------------+  FV DistalFull           Yes      Yes                                   +---------+---------------+---------+-----------+----------+--------------+   PFV     Full                                                          +---------+---------------+---------+-----------+----------+--------------+   POP     Full           Yes      Yes                                   +---------+---------------+---------+-----------+----------+--------------+   PTV     Full                                                          +---------+---------------+---------+-----------+----------+--------------+   PERO    Full                                                          +---------+---------------+---------+-----------+----------+--------------+          +---------+---------------+---------+-----------+----------+--------------+   LEFT    CompressibilityPhasicitySpontaneityPropertiesThrombus  Aging  +---------+---------------+---------+-----------+----------+--------------+   CFV     Full            Yes      Yes                                   +---------+---------------+---------+-----------+----------+--------------+   SFJ     Full                                                          +---------+---------------+---------+-----------+----------+--------------+   FV Prox  Full           Yes      Yes                                   +---------+---------------+---------+-----------+----------+--------------+   FV Mid   Full           Yes      Yes                                   +---------+---------------+---------+-----------+----------+--------------+   FV DistalFull           Yes      Yes                                   +---------+---------------+---------+-----------+----------+--------------+  PFV     Full                                                          +---------+---------------+---------+-----------+----------+--------------+   POP     Full           Yes      Yes                                   +---------+---------------+---------+-----------+----------+--------------+   PTV     Full                                                          +---------+---------------+---------+-----------+----------+--------------+   PERO    Full                                                          +---------+---------------+---------+-----------+----------+--------------+               Summary:  BILATERAL:  - No evidence of deep vein thrombosis seen in the lower extremities,  bilaterally.  -No evidence of popliteal cyst, bilaterally.    PATIENT SURVEYS:   ABC 78.1%  COGNITION: Overall cognitive status: Within functional limits for tasks assessed         LOWER EXTREMITY MMT:  MMT Right eval Left eval  Hip flexion    Hip extension    Hip abduction    Hip adduction    Hip internal rotation    Hip external rotation    Knee flexion    Knee extension    Ankle  dorsiflexion    Ankle plantarflexion    Ankle inversion    Ankle eversion     (Blank rows = not tested)  MMT deferred at eval due to elevated diastolic BP in context of recent CVA     FUNCTIONAL TESTS:   Eval:   SLS 30 seconds L, 30 seconds R but knee hyperextension and noted LE instability  Tandem stance 30 seconds B   GAIT: Distance walked: 180ft Assistive device utilized: None Level of assistance: Complete Independence Comments: noted proximal weakness with B knee valgus, trendelenburg, slow pace of gait with trunk/core weakness  TREATMENT DATE:   07/01/23  BP #1 136/105 HR 72 BP #2 141/104 HR 79  Eval, POC, education as below       PATIENT EDUCATION:  Education details: exam findings, POC, importance of good BP management- not able to push too hard today due to elevated diastolic BP especially in context of recent CVA, please take meds prior to therapy sessions so we can safely work with her  Person educated: Patient Education method: Explanation Education comprehension: verbalized understanding  HOME EXERCISE PROGRAM: 2nd session   ASSESSMENT:  CLINICAL IMPRESSION: Patient is a 33 y.o. F who was seen today for physical therapy evaluation and treatment for I63.9 (ICD-10-CM) - Acute ischemic stroke (HCC). Of note BP was high especially diastolic, she did not take her BP meds this morning. Eval was limited due to safety concerns with exertion in context of elevated diastolic values with recent CVA. Deferred MMT due to elevated BP at eval. She would likely benefit from some attention from skilled PT to work on general strength/balance but given insurance visit limits with split PT/OT/Speech services, do not anticipate long POC.   OBJECTIVE IMPAIRMENTS: Abnormal gait, decreased activity tolerance, decreased balance, decreased coordination,  decreased mobility, difficulty walking, decreased strength, and decreased safety awareness.   ACTIVITY LIMITATIONS: standing, stairs, transfers, and locomotion level  PARTICIPATION LIMITATIONS: meal prep, cleaning, laundry, shopping, community activity, and occupation  PERSONAL FACTORS: Behavior pattern, Education, Fitness, Past/current experiences, Social background, and Time since onset of injury/illness/exacerbation are also affecting patient's functional outcome.   REHAB POTENTIAL: Good  CLINICAL DECISION MAKING: Stable/uncomplicated  EVALUATION COMPLEXITY: Low   GOALS: Goals reviewed with patient? No  SHORT TERM GOALS: Target date: 07/15/2023   Will be compliant with appropriate progressive HEP  Baseline: Goal status: INITIAL  2.  Will be able to teach back on importance of good BP control especially after recent CVA  Baseline:  Goal status: INITIAL    LONG TERM GOALS: Target date: 07/29/2023    MMT to be at least 4+/5 all tested groups  Baseline:  Goal status: INITIAL  2.  Will score at least 22 on DGI  Baseline:  Goal status: INITIAL  3.  Will be able to ambulate at least 578ft in to show improved community access Baseline:  Goal status: INITIAL  4.  ABC score to have improved by at least 10 points  Baseline:  Goal status: INITIAL    PLAN:  PT FREQUENCY: 1-2x/week  PT DURATION: 4 weeks  PLANNED INTERVENTIONS: 97750- Physical Performance Testing, 97110-Therapeutic exercises, 97530- Therapeutic activity, [id number]- Neuromuscular re-education, 97535- Self Care, [id number]- Manual therapy, and [id number]- Gait training  PLAN FOR NEXT SESSION: Check BP. Still needs HEP. Otherwise strength, balance, functional activity tolerance if BP is WNL   Josette Rough, PT, DPT 07/26/23 9:37 AM

## 2023-07-28 ENCOUNTER — Other Ambulatory Visit

## 2023-07-28 ENCOUNTER — Ambulatory Visit: Admitting: Neurology

## 2023-07-28 ENCOUNTER — Encounter: Payer: Self-pay | Admitting: Neurology

## 2023-07-28 VITALS — BP 150/108 | HR 85 | Ht 66.0 in | Wt 238.0 lb

## 2023-07-28 DIAGNOSIS — E538 Deficiency of other specified B group vitamins: Secondary | ICD-10-CM | POA: Diagnosis not present

## 2023-07-28 DIAGNOSIS — F17200 Nicotine dependence, unspecified, uncomplicated: Secondary | ICD-10-CM

## 2023-07-28 DIAGNOSIS — E785 Hyperlipidemia, unspecified: Secondary | ICD-10-CM | POA: Diagnosis not present

## 2023-07-28 DIAGNOSIS — G4733 Obstructive sleep apnea (adult) (pediatric): Secondary | ICD-10-CM

## 2023-07-28 DIAGNOSIS — Z8673 Personal history of transient ischemic attack (TIA), and cerebral infarction without residual deficits: Secondary | ICD-10-CM

## 2023-07-28 DIAGNOSIS — I10 Essential (primary) hypertension: Secondary | ICD-10-CM | POA: Diagnosis not present

## 2023-07-28 NOTE — Patient Instructions (Addendum)
 I saw you today after your recent stroke.  I need to check blood work today. I will be in touch when I have your results.  I want you to see cardiology as planned. They may discuss putting in a long term monitor to make sure your heart does not beat funny contributing to stroke risk.  To reduce risk of future stroke: -Make sure you are taking aspirin  81 mg every day. You should not be taking Plavix  anymore. -Continue atorvastatin  40 mg daily -Work with your primary care doctor on good blood pressure control. Take medications they give you for this. -Continue CPAP for sleep apnea. Sleep apnea also increases stroke risk. -Stopping smoking would also help reduce stroke risk.  I recommend you continue to take B12 supplementation as your levels were low. I would take at least 1000 mcg daily.  If you have new difficulty speaking, face droop, numbness on one side of the body, weakness on one side of the body, or dizziness/imbalance, this could be the sign of a stroke. Don't wait, please call EMS and be evaluated at the nearest emergency room.   I will see you back in 6 months to check in on you. Please let me know if you have any questions or concerns in the meantime.   The physicians and staff at [facility] are committed to providing excellent care. You may receive a survey requesting feedback about your experience at our office. We strive to receive very good responses to the survey questions. If you feel that your experience would prevent you from giving the office a very good  response, please contact our office to try to remedy the situation. We may be reached at [phone]. Thank you for taking the time out of your busy day to complete the survey.  Venetia Potters, MD [facility]

## 2023-07-30 LAB — CARDIOLIPIN ANTIBODIES, IGG, IGM, IGA
Anticardiolipin IgA: <2 [APL'U]/mL (ref <20.0)
Anticardiolipin IgG: <2 [GPL'U]/mL (ref <20.0)
Anticardiolipin IgM: <2 [MPL'U]/mL (ref <20.0)

## 2023-08-01 ENCOUNTER — Ambulatory Visit: Payer: Self-pay | Admitting: Neurology

## 2023-08-05 ENCOUNTER — Ambulatory Visit: Admitting: Speech Pathology

## 2023-08-05 ENCOUNTER — Ambulatory Visit: Admitting: Occupational Therapy

## 2023-08-05 ENCOUNTER — Ambulatory Visit: Admitting: Physical Therapy

## 2023-08-10 DIAGNOSIS — I63511 Cerebral infarction due to unspecified occlusion or stenosis of right middle cerebral artery: Secondary | ICD-10-CM | POA: Diagnosis not present

## 2023-08-10 DIAGNOSIS — I639 Cerebral infarction, unspecified: Secondary | ICD-10-CM

## 2023-08-12 ENCOUNTER — Ambulatory Visit: Payer: Self-pay | Admitting: Cardiology

## 2023-08-13 ENCOUNTER — Encounter: Admitting: Speech Pathology

## 2023-08-13 ENCOUNTER — Encounter: Admitting: Occupational Therapy

## 2023-08-31 ENCOUNTER — Other Ambulatory Visit (HOSPITAL_COMMUNITY): Payer: Self-pay

## 2023-09-17 ENCOUNTER — Ambulatory Visit: Attending: Cardiology | Admitting: Cardiology

## 2023-09-17 ENCOUNTER — Encounter: Payer: Self-pay | Admitting: Cardiology

## 2023-09-17 VITALS — BP 210/110 | HR 70 | Ht 66.0 in | Wt 247.1 lb

## 2023-09-17 DIAGNOSIS — Z8673 Personal history of transient ischemic attack (TIA), and cerebral infarction without residual deficits: Secondary | ICD-10-CM | POA: Diagnosis not present

## 2023-09-17 DIAGNOSIS — I161 Hypertensive emergency: Secondary | ICD-10-CM | POA: Insufficient documentation

## 2023-09-17 DIAGNOSIS — F17200 Nicotine dependence, unspecified, uncomplicated: Secondary | ICD-10-CM | POA: Insufficient documentation

## 2023-09-17 DIAGNOSIS — Z5329 Procedure and treatment not carried out because of patient's decision for other reasons: Secondary | ICD-10-CM | POA: Insufficient documentation

## 2023-09-17 DIAGNOSIS — E782 Mixed hyperlipidemia: Secondary | ICD-10-CM | POA: Diagnosis not present

## 2023-09-17 DIAGNOSIS — I10 Essential (primary) hypertension: Secondary | ICD-10-CM | POA: Diagnosis not present

## 2023-09-17 NOTE — Progress Notes (Unsigned)
 Cardiology Office Note:    Date:  09/19/2023   ID:  Anita [name], DOB Feb 14, 1990, MRN [id number]  PCP:  Theotis Haze ORN, NP  Cardiologist:  None  Electrophysiologist:  None   Referring MD: Theotis Haze ORN, NP   No chief complaint on file.   History of Present Illness:    Anita [name] is a 33 y.o. female with a hx of recent acute ischemic right MCA stroke, hypertensive heart disease, class II obesity, hyperlipidemia, OSA and current smoker.  During her visit with me she seems a bit off although answer questions.  She admits that she has been missing some of her medications recently.  And still smokes.  Unclear if she is taking her dual antiplatelet therapy as well as her Lipitor.  Past Medical History:  Diagnosis Date   Anemia    Arthritis    Gout    Hypertension     Past Surgical History:  Procedure Laterality Date   KNEE SURGERY     LEG SURGERY     TRANSESOPHAGEAL ECHOCARDIOGRAM (CATH LAB) N/A 06/10/2023   Procedure: TRANSESOPHAGEAL ECHOCARDIOGRAM;  Surgeon: Michele Richardson, DO;  [facility]: [facility];  Service: Cardiovascular;  Laterality: N/A;    Current Medications: Current Meds  Medication Sig   amLODipine  (NORVASC ) 10 MG tablet Take 1 tablet (10 mg total) by mouth daily.   aspirin  EC 81 MG tablet Take 1 tablet (81 mg total) by mouth daily. Swallow whole.   atorvastatin  (LIPITOR) 40 MG tablet Take 1 tablet (40 mg total) by mouth daily.   buPROPion  (WELLBUTRIN  XL) 150 MG 24 hr tablet Take 1 tablet (150 mg total) by mouth daily. For anxiety and depression   carvedilol  (COREG ) 12.5 MG tablet Take 1 tablet (12.5 mg total) by mouth 2 (two) times daily with a meal.   valsartan  (DIOVAN ) 160 MG tablet Take 1 tablet (160 mg total) by mouth daily.     Allergies:   Patient has no known allergies.   Social History   Socioeconomic History   Marital status: Single    Spouse name: Not on file   Number of children: Not on file   Years of education: Not on file    Highest education level: Not on file  Occupational History   Not on file  Tobacco Use   Smoking status: Every Day    Types: Cigars   Smokeless tobacco: Never   Tobacco comments:    2 cigars a day  Vaping Use   Vaping status: Never Used  Substance and Sexual Activity   Alcohol use: Yes    Comment: occas   Drug use: No   Sexual activity: Yes    Birth control/protection: None  Other Topics Concern   Not on file  Social History Narrative   Are you right handed or left handed? Left   Are you currently employed ?    What is your current occupation?   Do you live at home alone?    Who lives with you? family   What type of home do you live in: 1 story or 2 story? Two    [place]affiene none   Social Drivers of Corporate investment banker Strain: Not on file  Food Insecurity: No Food Insecurity (06/12/2023)   Hunger Vital Sign    Worried About Running Out of Food in the Last Year: Never true    Ran Out of Food in the Last Year: Never true  Transportation Needs: No Transportation  Needs (06/12/2023)   PRAPARE - Administrator, Civil Service (Medical): No    Lack of Transportation (Non-Medical): No  Physical Activity: Not on file  Stress: Not on file  Social Connections: Not on file     Family History: The patient's family history includes Aneurysm in her father; Hypertension in her mother and sister. There is no history of Alcohol abuse, Arthritis, Asthma, Birth defects, Cancer, COPD, Depression, Diabetes, Drug abuse, Early death, Hearing loss, Heart disease, Hyperlipidemia, Kidney disease, Learning disabilities, Mental illness, Mental retardation, Miscarriages / Stillbirths, Stroke, or Vision loss.  ROS:   Review of Systems  Constitution: Negative for decreased appetite, fever and weight gain.  HENT: Negative for congestion, ear discharge, hoarse voice and sore throat.   Eyes: Negative for discharge, redness, vision loss in right eye and visual halos.  Cardiovascular:  Negative for chest pain, dyspnea on exertion, leg swelling, orthopnea and palpitations.  Respiratory: Negative for cough, hemoptysis, shortness of breath and snoring.   Endocrine: Negative for heat intolerance and polyphagia.  Hematologic/Lymphatic: Negative for bleeding problem. Does not bruise/bleed easily.  Skin: Negative for flushing, nail changes, rash and suspicious lesions.  Musculoskeletal: Negative for arthritis, joint pain, muscle cramps, myalgias, neck pain and stiffness.  Gastrointestinal: Negative for abdominal pain, bowel incontinence, diarrhea and excessive appetite.  Genitourinary: Negative for decreased libido, genital sores and incomplete emptying.  Neurological: Negative for brief paralysis, focal weakness, headaches and loss of balance.  Psychiatric/Behavioral: Negative for altered mental status, depression and suicidal ideas.  Allergic/Immunologic: Negative for HIV exposure and persistent infections.    EKGs/Labs/Other Studies Reviewed:    The following studies were reviewed today:   EKG:  The ekg ordered today demonstrates   Recent Labs: 06/08/2023: ALT 21 07/07/2023: BUN 16; Creatinine, Ser 1.02; Hemoglobin 10.0; Platelets 201; Potassium 4.1; Sodium 140  Recent Lipid Panel    Component Value Date/Time   CHOL 169 06/09/2023 0540   CHOL 195 07/23/2022 1346   TRIG 72 06/09/2023 0540   HDL 57 06/09/2023 0540   HDL 48 07/23/2022 1346   CHOLHDL 3.0 06/09/2023 0540   VLDL 14 06/09/2023 0540   LDLCALC 98 06/09/2023 0540   LDLCALC 126 (H) 07/23/2022 1346    Physical Exam:    VS:  BP (!) 210/110   Pulse 70   Ht 5' 6 (1.676 m)   Wt 247 lb 1.6 oz (112.1 kg)   SpO2 97%   BMI 39.88 kg/m     Wt Readings from Last 3 Encounters:  09/17/23 247 lb 1.6 oz (112.1 kg)  07/28/23 238 lb (108 kg)  07/07/23 236 lb (107 kg)     GEN: Well nourished, well developed in no acute distress HEENT: Normal NECK: No JVD; No carotid bruits LYMPHATICS: No  lymphadenopathy CARDIAC: S1S2 noted,RRR, no murmurs, rubs, gallops RESPIRATORY:  Clear to auscultation without rales, wheezing or rhonchi  ABDOMEN: Soft, non-tender, non-distended, +bowel sounds, no guarding. EXTREMITIES: No edema, No cyanosis, no clubbing MUSCULOSKELETAL:  No deformity  SKIN: Warm and dry NEUROLOGIC:  Alert and oriented x 3, non-focal PSYCHIATRIC:  Normal affect, good insight  ASSESSMENT:    1. History of CVA (cerebrovascular accident)   2. Hypertensive emergency   3. Smoker   4. Morbid obesity (HCC)   5. Uncontrolled hypertension   6. Mixed hyperlipidemia   7. Left against medical advice    PLAN:     Hypertensive emergency -blood pressure manually for me was 210/110 mmHg.  She admits that she has  been missing some of her medications.  In light of her recent stroke is important for the patient to be evaluated in the emergency department given the fact that she also appears significantly fatigued and a bit off.  Unfortunately she has declined to go to the emergency department to get treated for her significantly elevated blood pressure and chooses to leave AGAINST MEDICAL ADVICE.  The patient is in agreement with the above plan. The patient left the office in stable condition.  The patient will follow up in   Medication Adjustments/Labs and Tests Ordered: Current medicines are reviewed at length with the patient today.  Concerns regarding medicines are outlined above.  No orders of the defined types were placed in this encounter.  No orders of the defined types were placed in this encounter.   There are no Patient Instructions on file for this visit.   Adopting a Healthy Lifestyle.  Know what a healthy weight is for you (roughly BMI <25) and aim to maintain this   Aim for 7+ servings of fruits and vegetables daily   65-80+ fluid ounces of water or unsweet tea for healthy kidneys   Limit to max 1 drink of alcohol per day; avoid smoking/tobacco   Limit  animal fats in diet for cholesterol and heart health - choose grass fed whenever available   Avoid highly processed foods, and foods high in saturated/trans fats   Aim for low stress - take time to unwind and care for your mental health   Aim for 150 min of moderate intensity exercise weekly for heart health, and weights twice weekly for bone health   Aim for 7-9 hours of sleep daily   When it comes to diets, agreement about the perfect plan isnt easy to find, even among the experts. Experts at the [facility] [employer] developed an idea known as the Healthy Eating Plate. Just imagine a plate divided into logical, healthy portions.   The emphasis is on diet quality:   Load up on vegetables and fruits - one-half of your plate: Aim for color and variety, and remember that potatoes dont count.   Go for whole grains - one-quarter of your plate: Whole wheat, barley, wheat berries, quinoa, oats, brown rice, and foods made with them. If you want pasta, go with whole wheat pasta.   Protein power - one-quarter of your plate: Fish, chicken, beans, and nuts are all healthy, versatile protein sources. Limit red meat.   The diet, however, does go beyond the plate, offering a few other suggestions.   Use healthy plant oils, such as olive, canola, soy, corn, sunflower and peanut. Check the labels, and avoid partially hydrogenated oil, which have unhealthy trans fats.   If youre thirsty, drink water. Coffee and tea are good in moderation, but skip sugary drinks and limit milk and dairy products to one or two daily servings.   The type of carbohydrate in the diet is more important than the amount. Some sources of carbohydrates, such as vegetables, fruits, whole grains, and beans-are healthier than others.   Finally, stay active  Signed, Erricka Falkner, DO  09/19/2023 1:33 PM    [facility]

## 2023-09-20 ENCOUNTER — Other Ambulatory Visit: Payer: Self-pay

## 2023-09-20 ENCOUNTER — Emergency Department (HOSPITAL_COMMUNITY): Admission: EM | Admit: 2023-09-20 | Discharge: 2023-09-20 | Disposition: A | Discharge status: Home / Self Care

## 2023-09-20 DIAGNOSIS — I10 Essential (primary) hypertension: Secondary | ICD-10-CM | POA: Diagnosis not present

## 2023-09-20 DIAGNOSIS — R519 Headache, unspecified: Secondary | ICD-10-CM

## 2023-09-20 LAB — CBC
HCT: 34.4 % — ABNORMAL LOW (ref 36.0–46.0)
Hemoglobin: 9.7 g/dL — ABNORMAL LOW (ref 12.0–15.0)
MCH: 20.7 pg — ABNORMAL LOW (ref 26.0–34.0)
MCHC: 28.2 g/dL — ABNORMAL LOW (ref 30.0–36.0)
MCV: 73.3 fL — ABNORMAL LOW (ref 80.0–100.0)
Platelets: 175 K/uL (ref 150–400)
RBC: 4.69 MIL/uL (ref 3.87–5.11)
RDW: 18.6 % — ABNORMAL HIGH (ref 11.5–15.5)
WBC: 6.3 K/uL (ref 4.0–10.5)
nRBC: 0 % (ref 0.0–0.2)

## 2023-09-20 LAB — BASIC METABOLIC PANEL WITH GFR
Anion gap: 12 (ref 5–15)
BUN: 18 mg/dL (ref 6–20)
CO2: 20 mmol/L — ABNORMAL LOW (ref 22–32)
Calcium: 8.6 mg/dL — ABNORMAL LOW (ref 8.9–10.3)
Chloride: 107 mmol/L (ref 98–111)
Creatinine, Ser: 1.14 mg/dL — ABNORMAL HIGH (ref 0.44–1.00)
GFR, Estimated: >60 mL/min (ref >60)
Glucose, Bld: 95 mg/dL (ref 70–99)
Potassium: 4 mmol/L (ref 3.5–5.1)
Sodium: 139 mmol/L (ref 135–145)

## 2023-09-20 LAB — POC URINE PREG, ED: Preg Test, Ur: NEGATIVE

## 2023-09-20 MED ORDER — CARVEDILOL 12.5 MG PO TABS
12.5000 mg | ORAL_TABLET | Freq: Two times a day (BID) | ORAL | Status: AC
  Administered 2023-09-20: 12.5 mg via ORAL
  Filled 2023-09-20: qty 1

## 2023-09-20 MED ORDER — PROCHLORPERAZINE EDISYLATE 10 MG/2ML IJ SOLN
10.0000 mg | Freq: Once | INTRAMUSCULAR | Status: AC
  Administered 2023-09-20: 10 mg via INTRAVENOUS
  Filled 2023-09-20: qty 2

## 2023-09-20 MED ORDER — IRBESARTAN 150 MG PO TABS
150.0000 mg | ORAL_TABLET | Freq: Every day | ORAL | Status: DC
  Administered 2023-09-20: 150 mg via ORAL
  Filled 2023-09-20: qty 1

## 2023-09-20 MED ORDER — DIPHENHYDRAMINE HCL 50 MG/ML IJ SOLN
25.0000 mg | Freq: Once | INTRAMUSCULAR | Status: AC
  Administered 2023-09-20: 25 mg via INTRAVENOUS
  Filled 2023-09-20: qty 1

## 2023-09-20 MED ORDER — AMLODIPINE BESYLATE 5 MG PO TABS
10.0000 mg | ORAL_TABLET | Freq: Once | ORAL | Status: AC
  Administered 2023-09-20: 10 mg via ORAL
  Filled 2023-09-20: qty 2

## 2023-09-20 MED ORDER — LACTATED RINGERS IV BOLUS
1000.0000 mL | Freq: Once | INTRAVENOUS | Status: AC
Start: 2023-09-20 — End: 2023-09-20
  Administered 2023-09-20: 1000 mL via INTRAVENOUS

## 2023-09-20 NOTE — ED Provider Notes (Signed)
 [place] EMERGENCY DEPARTMENT AT [facility] Provider Note HPI Anita [name] is a 33 y.o. female with a PMH of HTN, anemia, arthritis, recent ischemic right MCA stroke with no residual deficits who presents to the emergency department due to concern for hypertension.  The patient did not take her blood pressure medications this morning.  She has had a mild headache for the last 2 days.  No vision changes or dizziness.  No weakness or numbness.  Denies fevers, cough, chest pain, shortness of breath, abdominal pain, nausea, vomiting  Past Medical History:  Diagnosis Date   Anemia    Arthritis    Gout    Hypertension    Past Surgical History:  Procedure Laterality Date   KNEE SURGERY     LEG SURGERY     TRANSESOPHAGEAL ECHOCARDIOGRAM (CATH LAB) N/A 06/10/2023   Procedure: TRANSESOPHAGEAL ECHOCARDIOGRAM;  Surgeon: Michele Richardson, DO;  Location: [facility];  Service: Cardiovascular;  Laterality: N/A;    Review of Systems Pertinent positives and negative findings are listed as part of the History of Present Illness and MDM  Physical Exam Vitals:   09/20/23 1901 09/20/23 1915 09/20/23 1930 09/20/23 2110  BP: (!) 210/144 (!) 185/119    Pulse: 63 (!) 58  62  Resp: 16   16  Temp: 97.7 F (36.5 C)     SpO2: 100% 100% 100% 99%     Constitutional Nursing notes reviewed Vital signs reviewed  HEENT No obvious trauma Pupils round, equal, and reactive to light. EOMI Neck supple  Respiratory Effort normal Breathing well on room air CTAB  CV Normal rate and rhythm  No pitting edema  Abdomen Soft, non-tender, non-distended No peritonitis  MSK Atraumatic No obvious deformity ROM appropriate  Neuro Awake and alert Cranial nerves II through XII intact 5/5 strength in bilateral upper and lower extremities Sensation to light touch intact Finger-nose testing intact No nystagmus    MDM:  Initial Differential Diagnoses includes headache, migraine, ischemic stroke,  intracranial bleed, PRES, hypertensive urgency, hypertensive emergency  I reviewed the patient's vitals, the nursing triage note and evaluated the patient at bedside.  Patient presents with hypertension and a mild headache.  No vision changes.  Neurologically intact as detailed above.  Low concern for CVA given her neurologic exam.  She was given her home blood pressure medications, lactated Ringer 's, Compazine  and Benadryl  for hypertension and headache  I reviewed the patient's external records which show that the patient had a right ischemic MCA stroke in June of this year that was attributed to uncontrolled hypertension.  Patient states that her baseline systolic blood pressures in the 180s.    EKG interpreted by myself shows normal sinus rhythm with no evidence of acute ischemic changes or high grade conduction block. There is no STEMI or contiguous T wave inversions. No evidence of new-onset arrythmia. The PR, QRS, and QT intervals are normal.  Patient has no chest pain or shortness of breath.  Will not pursue further workup for ACS  Labs interpreted myself show no evidence of AKI or significant electrolyte abnormalities.  Hemoglobin at baseline.  No leukocytosis.  On reevaluation, patient has had complete resolution of her headache.  She remains neurologically intact.  She has no complaints at this time.  She states that her blood pressure is usually in the 180s systolic.  Repeat blood pressure here was 180 systolic.  Counseled her on adherence to her blood pressure medications.  Patient discharged home  Procedures: Procedures  Medications administered in the ED: Medications  irbesartan  (AVAPRO ) tablet 150 mg (150 mg Oral Given 09/20/23 1957)  lactated ringers  bolus 1,000 mL (0 mLs Intravenous Stopped 09/20/23 2110)  prochlorperazine  (COMPAZINE ) injection 10 mg (10 mg Intravenous Given 09/20/23 1959)  diphenhydrAMINE  (BENADRYL ) injection 25 mg (25 mg Intravenous Given 09/20/23 1959)   amLODipine  (NORVASC ) tablet 10 mg (10 mg Oral Given 09/20/23 1957)  carvedilol  (COREG ) tablet 12.5 mg (12.5 mg Oral Given 09/20/23 1957)     Impression: 1. Hypertension, unspecified type   2. Acute nonintractable headache, unspecified headache type      Patient's presentation is most consistent with acute presentation with potential threat to life or bodily function.  Disposition: ED Disposition:  Discharge   Discharge: Patient is felt to be medically appropriate for discharge at this time. Patient was instructed to follow up with their primary care doctor/specialists listed above for re-evaluation. Patient was given strict return precautions.  ED Discharge Orders     None             Dionisio Blunt, MD 09/20/23 2344    Simon Lavonia SAILOR, MD 09/21/23 0005

## 2023-09-20 NOTE — ED Triage Notes (Signed)
 Patient arrives for eval of hypertension x 1 week. Reports noncompliance with medications due to forgetting whether or not she has taken her meds. Has not taken any today. Has associated headache but no chest pain.

## 2023-09-20 NOTE — Discharge Instructions (Addendum)
 You were seen in the emergency department today for high blood pressure and headache.  While you were here we did a physical exam, labs, EKG and gave you blood pressure medications along with medication for your headache.  You had complete resolution of your headache.  Please follow-up with your primary care doctor and please adhere to your home blood pressure medications.    Come back to the emergency department if you have recurrence of this headache with neck stiffness, weakness in one-sided body, numbness or tingling anywhere if you have any other reason to believe you need emergency care.

## 2023-10-06 ENCOUNTER — Telehealth: Payer: Self-pay | Admitting: Nurse Practitioner

## 2023-10-06 NOTE — Telephone Encounter (Signed)
 Pt unconfirmed appt 10/1 (per vr )

## 2023-10-08 ENCOUNTER — Ambulatory Visit: Attending: Nurse Practitioner | Admitting: Nurse Practitioner

## 2023-10-08 ENCOUNTER — Encounter: Payer: Self-pay | Admitting: Nurse Practitioner

## 2023-10-08 VITALS — BP 128/87 | HR 65 | Resp 18 | Ht 66.0 in | Wt 246.0 lb

## 2023-10-08 DIAGNOSIS — I10 Essential (primary) hypertension: Secondary | ICD-10-CM | POA: Diagnosis not present

## 2023-10-08 NOTE — Progress Notes (Signed)
 Assessment & Plan:  Anita [name] was seen today for hypertension.  Diagnoses and all orders for this visit:  Primary hypertension -     CMP14+EGFR Continue all antihypertensives as prescribed.  Reminded to bring in blood pressure log for follow  up appointment.  RECOMMENDATIONS: DASH/Mediterranean Diets are healthier choices for HTN.      Patient has been counseled on age-appropriate routine health concerns for screening and prevention. These are reviewed and up-to-date. Referrals have been placed accordingly. Immunizations are up-to-date or declined.    Subjective:   Chief Complaint  Patient presents with   Hypertension    Anita [name] 33 y.o. female presents to office today for follow-up to hypertension.  She has a past medical history of Anemia, Arthritis, CVA, tobacco user, hyperlipidemia gout, and Hypertension.    She is currently on leave from work due to symptomatic uncontrolled hypertension.  She has been out of work since September 20, 2023 and is requesting to return to work on October 20, 2023.  Blood pressure is currently well-controlled.  She does not miss doses of her blood pressure medication.  She is currently prescribed carvedilol  12.5 mg twice daily daily, valsartan  160 mg daily and amlodipine  10 mg daily BP Readings from Last 3 Encounters:  10/08/23 128/87  09/20/23 (!) 185/119  09/17/23 (!) 210/110    She has been experiencing low back and right hip pain      HPI  Review of Systems  Constitutional:  Negative for fever, malaise/fatigue and weight loss.  HENT: Negative.  Negative for nosebleeds.   Eyes: Negative.  Negative for blurred vision, double vision and photophobia.  Respiratory: Negative.  Negative for cough and shortness of breath.   Cardiovascular: Negative.  Negative for chest pain, palpitations and leg swelling.  Gastrointestinal: Negative.  Negative for heartburn, nausea and vomiting.  Musculoskeletal:  Positive for back pain and joint  pain. Negative for myalgias.  Neurological: Negative.  Negative for dizziness, focal weakness, seizures and headaches.  Psychiatric/Behavioral: Negative.  Negative for suicidal ideas.     Past Medical History:  Diagnosis Date   Anemia    Arthritis    Gout    Hypertension     Past Surgical History:  Procedure Laterality Date   KNEE SURGERY     LEG SURGERY     TRANSESOPHAGEAL ECHOCARDIOGRAM (CATH LAB) N/A 06/10/2023   Procedure: TRANSESOPHAGEAL ECHOCARDIOGRAM;  Surgeon: Michele Richardson, DO;  Location: [facility];  Service: Cardiovascular;  Laterality: N/A;    Family History  Problem Relation Age of Onset   Hypertension Mother    Aneurysm Father    Hypertension Sister    Alcohol abuse Neg Hx    Arthritis Neg Hx    Asthma Neg Hx    Birth defects Neg Hx    Cancer Neg Hx    COPD Neg Hx    Depression Neg Hx    Diabetes Neg Hx    Drug abuse Neg Hx    Early death Neg Hx    Hearing loss Neg Hx    Heart disease Neg Hx    Hyperlipidemia Neg Hx    Kidney disease Neg Hx    Learning disabilities Neg Hx    Mental illness Neg Hx    Mental retardation Neg Hx    Miscarriages / Stillbirths Neg Hx    Stroke Neg Hx    Vision loss Neg Hx     Social History Reviewed with no changes to be made today.  Outpatient Medications Prior to Visit  Medication Sig Dispense Refill   amLODipine  (NORVASC ) 10 MG tablet Take 1 tablet (10 mg total) by mouth daily. 90 tablet 3   aspirin  EC 81 MG tablet Take 1 tablet (81 mg total) by mouth daily. Swallow whole.     atorvastatin  (LIPITOR) 40 MG tablet Take 1 tablet (40 mg total) by mouth daily. 90 tablet 3   buPROPion  (WELLBUTRIN  XL) 150 MG 24 hr tablet Take 1 tablet (150 mg total) by mouth daily. For anxiety and depression 90 tablet 1   carvedilol  (COREG ) 12.5 MG tablet Take 1 tablet (12.5 mg total) by mouth 2 (two) times daily with a meal. 60 tablet 3   valsartan  (DIOVAN ) 160 MG tablet Take 1 tablet (160 mg total) by mouth daily. 90 tablet 3    No facility-administered medications prior to visit.    No Known Allergies     Objective:    BP 128/87 (BP Location: Left Arm, Patient Position: Sitting, Cuff Size: Normal)   Pulse 65   Resp 18   Ht 5' 6 (1.676 m)   Wt 246 lb (111.6 kg)   LMP 09/21/2023 (Approximate)   SpO2 100%   BMI 39.71 kg/m  Wt Readings from Last 3 Encounters:  10/08/23 246 lb (111.6 kg)  09/17/23 247 lb 1.6 oz (112.1 kg)  07/28/23 238 lb (108 kg)    Physical Exam Vitals and nursing note reviewed.  Constitutional:      Appearance: She is well-developed. She is obese.  HENT:     Head: Normocephalic and atraumatic.  Cardiovascular:     Rate and Rhythm: Normal rate and regular rhythm.     Heart sounds: Normal heart sounds. No murmur heard.    No friction rub. No gallop.  Pulmonary:     Effort: Pulmonary effort is normal. No tachypnea or respiratory distress.     Breath sounds: Normal breath sounds. No decreased breath sounds, wheezing, rhonchi or rales.  Chest:     Chest wall: No tenderness.  Musculoskeletal:        General: Normal range of motion.     Cervical back: Normal range of motion.  Skin:    General: Skin is warm and dry.  Neurological:     Mental Status: She is alert and oriented to person, place, and time.     Coordination: Coordination normal.  Psychiatric:        Behavior: Behavior normal. Behavior is cooperative.        Thought Content: Thought content normal.        Judgment: Judgment normal.          Patient has been counseled extensively about nutrition and exercise as well as the importance of adherence with medications and regular follow-up. The patient was given clear instructions to go to ER or return to medical center if symptoms don't improve, worsen or new problems develop. The patient verbalized understanding.   Follow-up: Return in about 3 months (around 01/08/2024).   Haze LELON Servant, FNP-BC [facility] [place],  [place] [phone]   10/08/2023, 11:18 AM

## 2023-10-08 NOTE — Telephone Encounter (Signed)
 Forms have bee printed and given to provider.

## 2023-10-09 LAB — CMP14+EGFR
ALT: 31 IU/L (ref 0–32)
AST: 19 IU/L (ref 0–40)
Albumin: 4 g/dL (ref 3.9–4.9)
Alkaline Phosphatase: 85 IU/L (ref 41–116)
BUN/Creatinine Ratio: 19 (ref 9–23)
BUN: 19 mg/dL (ref 6–20)
Bilirubin Total: 0.2 mg/dL (ref 0.0–1.2)
CO2: 21 mmol/L (ref 20–29)
Calcium: 8.9 mg/dL (ref 8.7–10.2)
Chloride: 103 mmol/L (ref 96–106)
Creatinine, Ser: 1.01 mg/dL — ABNORMAL HIGH (ref 0.57–1.00)
Globulin, Total: 3 g/dL (ref 1.5–4.5)
Glucose: 83 mg/dL (ref 70–99)
Potassium: 3.9 mmol/L (ref 3.5–5.2)
Sodium: 139 mmol/L (ref 134–144)
Total Protein: 7 g/dL (ref 6.0–8.5)
eGFR: 76 mL/min/1.73 (ref >59)

## 2023-10-11 ENCOUNTER — Ambulatory Visit: Payer: Self-pay | Admitting: Nurse Practitioner

## 2023-10-13 NOTE — Telephone Encounter (Signed)
 Noted

## 2023-10-14 NOTE — Telephone Encounter (Signed)
 Copied from CRM [id number]. Topic: General - Other >> Oct 14, 2023  4:15 PM Kevelyn M wrote:  Reason for CRM: Return to work paper was sent through [employer]. She has to have paper filled out 3 days prior to return date. Provider put return date for the 15th. Will need to push it out to 20th. The 15th is too close because paperwork is not complete.  Call back # [phone]

## 2023-10-18 NOTE — Telephone Encounter (Signed)
 Pt called back in to see if the FMLA paperwork has been filled out for pt to return to work. Please call pt and advise.

## 2023-11-04 NOTE — Telephone Encounter (Signed)
 Copied from CRM #[id number]. Topic: General - Other >> Nov 04, 2023 12:32 PM Charlet HERO wrote:  Reason for CRM: patient is calling to see if her paperwork is ready to be picked up. Called the office for further asst. Informed the patient that it is not ready and will be called when its ready.

## 2023-11-05 NOTE — Telephone Encounter (Signed)
 Noted

## 2023-11-08 ENCOUNTER — Telehealth: Payer: Self-pay

## 2023-11-08 NOTE — Telephone Encounter (Signed)
 Patient identified by name and date of birth.  Patient called and informed of paperwork being completed and ready for pick up.

## 2023-11-08 NOTE — Telephone Encounter (Signed)
 Patient came in the office today and picked up the release form to return to work.

## 2023-11-09 NOTE — Telephone Encounter (Signed)
 Noted

## 2024-01-07 ENCOUNTER — Telehealth: Payer: Self-pay | Admitting: Nurse Practitioner

## 2024-01-07 NOTE — Telephone Encounter (Signed)
Contacted pt confirmed appt

## 2024-01-10 ENCOUNTER — Ambulatory Visit: Admitting: Nurse Practitioner

## 2024-01-12 ENCOUNTER — Other Ambulatory Visit: Payer: Self-pay

## 2024-01-12 ENCOUNTER — Other Ambulatory Visit (HOSPITAL_COMMUNITY): Payer: Self-pay

## 2024-01-20 NOTE — Progress Notes (Signed)
 "  NEUROLOGY FOLLOW UP OFFICE NOTE  Anita [name] [id number]  Subjective:  Anita [name] is a 34 y.o. year old left-handed female with a medical history of HTN, HLD, anxiety, depression, OSA who we last saw on 07/28/23 for stroke.  To briefly review: 07/28/23: Patient presented to ED on 06/08/23 via EMS for left arm weakness, left facial droop, and left sensory deficit. She was at work and noticed symptoms acutely. Her speech was also off. On EMS arrival, her SBP was 220 (endorsed not taking BP medications properly). On arrival to ED with neuro evaluation, iNIHSS was 1 for LUE sensory deficit. CTH was normal. MRI brain showed multiple small acute right middle cerebral artery territory infarcts. CTA showed right M3 occlusion. LDL was 98, so atorvastatin  40 mg daily was added. DVT US  of LE was negative. Cardiolipin antibodies were minimally elevated, so repeat was recommended. ANA, sickle cell screen were negative. Echo (TEE) showed no PFO or clot and normal EF.   Patient was put on DAPT with asa 81 mg daily and Plavix  75 mg daily for 3 weeks with plan of aspirin  monotherapy thereafter (started 06/09/23). She should be on aspirin  81 mg daily alone now, but she had difficulty knowing what medications she was on when asked, so it is unclear if she is taking this.   She also tested positive in hospital for OSA. She tried CPAP. She is still using this nightly. Holter monitor was also ordered for patient. This was normal.   Patient was discharged home on 06/12/23.    Since discharge, patient thinks she is doing well. She denies any residual stroke symptoms. She is doing outpatient speech therapy, PT, and OT.   She is a current smoker (black and milds, 2 per day).    She has a cardiology appointment in 09/2023.  Most recent Assessment and Plan (07/28/23): Anita [name] is a 34 y.o. 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 Her original symptoms were left sided weakness,  left sided numbness, and difficulty speaking. Fortunately, she has no current residual symptoms. Her MRI brain showed multiple very small, cortical strokes. CTA showed right M3 occlusion. She has a relevant medical history of HTN, HLD, anxiety, depression, OSA. Her neurological examination is normal today. The etiology of stroke is currently unclear. Risk factors for stroke include HTN, HLD, OSA, and current smoking. Her stroke looks more embolic than small vessel though, so hypercoag state or cardioembolic etiology should be considered as well. Her BP is very high today and she was uncertain of medications when asked, so I also worry that she is taking taking medications as prescribed, if at all.   PLAN: -Blood work: Cardiolipin antibodies -See cardiology as planned. I would recommend loop recorder consideration for possible cardio embolic source of M3 occlusion -Continue aspirin  81 mg daily. Patient will make sure she is taking this and not still taking Plavix . -Continue atorvastatin  40 mg daily -Continue B12 supplementation -Work with PCP on good BP control -Continue CPAP for OSA -Discussed smoking cessation -Discussed stroke warning signs  Since their last visit: Labs were normal. Patient's BP is very high (215/148). She denies any headache, neurologic symptoms, or chest pain. Cardiology note from 09/17/23 mentions very high BP and that patient may not have been taking her medications.  Patient continues to be symptom free since her stroke.  She has had some memory changes since her stroke. She sleeps 6-7 hours per night. She is using her CPAP.  Regarding her mood, she does endorse occasionally feeling down or depressed.   MEDICATIONS:  Outpatient Encounter Medications as of 01/28/2024  Medication Sig   amLODipine  (NORVASC ) 10 MG tablet Take 1 tablet (10 mg total) by mouth daily.   aspirin  EC 81 MG tablet Take 1 tablet (81 mg total) by mouth daily. Swallow whole.   atorvastatin  (LIPITOR)  40 MG tablet Take 1 tablet (40 mg total) by mouth daily.   buPROPion  (WELLBUTRIN  XL) 150 MG 24 hr tablet Take 1 tablet (150 mg total) by mouth daily. For anxiety and depression   carvedilol  (COREG ) 12.5 MG tablet Take 1 tablet (12.5 mg total) by mouth 2 (two) times daily with a meal.   valsartan  (DIOVAN ) 160 MG tablet Take 1 tablet (160 mg total) by mouth daily.   No facility-administered encounter medications on file as of 01/28/2024.    PAST MEDICAL HISTORY: Past Medical History:  Diagnosis Date   Anemia    Arthritis    Gout    Hypertension     PAST SURGICAL HISTORY: Past Surgical History:  Procedure Laterality Date   KNEE SURGERY     LEG SURGERY     TRANSESOPHAGEAL ECHOCARDIOGRAM (CATH LAB) N/A 06/10/2023   Procedure: TRANSESOPHAGEAL ECHOCARDIOGRAM;  Surgeon: Michele Richardson, DO;  Location: [facility];  Service: Cardiovascular;  Laterality: N/A;    ALLERGIES: Allergies[1]  FAMILY HISTORY: Family History  Problem Relation Age of Onset   Hypertension Mother    Aneurysm Father    Hypertension Sister    Alcohol abuse Neg Hx    Arthritis Neg Hx    Asthma Neg Hx    Birth defects Neg Hx    Cancer Neg Hx    COPD Neg Hx    Depression Neg Hx    Diabetes Neg Hx    Drug abuse Neg Hx    Early death Neg Hx    Hearing loss Neg Hx    Heart disease Neg Hx    Hyperlipidemia Neg Hx    Kidney disease Neg Hx    Learning disabilities Neg Hx    Mental illness Neg Hx    Mental retardation Neg Hx    Miscarriages / Stillbirths Neg Hx    Stroke Neg Hx    Vision loss Neg Hx     SOCIAL HISTORY: Social History[2] Social History   Social History Narrative   Are you right handed or left handed? Left   Are you currently employed ?    What is your current occupation?   Do you live at home alone?    Who lives with you? family   What type of home do you live in: 1 story or 2 story? Two    Caffiene none      Objective:  Vital Signs:  BP (!) 215/148   Pulse 84   Ht 5' 6  (1.676 m)   Wt 252 lb (114.3 kg)   SpO2 99%   BMI 40.67 kg/m   General: No acute distress.  Patient appears well-groomed.   Head:  Normocephalic/atraumatic Neck: supple Heart: regular rate and rhythm Lungs: Clear to auscultation bilaterally. Vascular: No carotid bruits.  Neurological Exam: Mental status: alert and oriented, speech fluent and not dysarthric, language intact.  Cranial nerves: CN I: not tested CN II: pupils equal, round and reactive to light, visual fields intact CN III, IV, VI:  full range of motion, no nystagmus, no ptosis CN V: facial sensation intact. CN VII: upper and lower face  symmetric CN VIII: hearing intact CN IX, X: uvula midline CN XI: sternocleidomastoid and trapezius muscles intact CN XII: tongue midline  Bulk & Tone: normal, no fasciculations. Motor:  muscle strength 5/5 throughout Deep Tendon Reflexes:  2+ throughout.   Sensation:  Light touch sensation intact. Finger to nose testing:  Without dysmetria.   Gait:  Normal station and stride.  Romberg negative.   Labs and Imaging review: New results: CMP (10/08/23) significant for Cr 1.01 CBC (09/20/23): significant for Hb 9.7, MCV 73.3 Cardiolipin abs (07/28/23) negative  Previously reviewed results: CMP (06/08/23) significant for glucose 111, Cr 1.09, AST 50, ALT 21   06/09/23: Lipid panel: tChol 169, LDL 98, TG 72 B12: 237 Ferritin: 12 ANA negative Cardiolipin antibodies: IgM elevated to 15 (indeterminate range) Sickle cell screen negative   HbA1c (06/10/23): 4.9 CBC w/ diff (07/07/23) significant for Hb 10.0, MCV 71   Imaging/Procedures: CTH w/ contrast (06/08/23): No evidence of an acute intracranial abnormality.    MRI brain wo contrast (06/08/23): Personally reviewed and agree with radiology findings below FINDINGS: Brain:   Cerebral volume is normal.   Small acute right middle cerebral artery territory cortical infarcts within the insula and at the posterior aspect of the sylvian  fissure (for instance as seen on series 5, images 70 and 73).   Additional small patchy acute right MCA territory infarcts within the right frontoparietal white matter (series 5, images 76-78).   Background multifocal T2 FLAIR hyperintense signal abnormality within the cerebral white matter, mild but unexpected for age.   Expanded and partially empty sella turcica.   No evidence of an intracranial mass.   No chronic intracranial blood products.   No extra-axial fluid collection.   No midline shift.   Vascular: Maintained flow voids within the proximal large arterial vessels.   Skull and upper cervical spine: Abnormal T1 hypointense marrow signal within the calvarium and within visible portions of the cervical spine.   Sinuses/Orbits: No mass or acute finding within the imaged orbits. No significant paranasal sinus disease.   Impression #1 called by telephone at the time of interpretation on 06/08/2023 at 7:26 pm to provider ANDREW TEE , who verbally acknowledged these results.   IMPRESSION: 1. 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 2. Background T2 FLAIR hyperintense signal changes within the cerebral white matter, overall mild but unexpected for age. Findings are nonspecific and differential considerations include age-advanced chronic small vessel ischemic disease, sequelae of chronic migraine headaches, vasculitis, sequelae of a prior infectious/inflammatory process and demyelinating disease, among others. 3. Abnormal T1 hypointense marrow signal within the calvarium and within visible portions of the cervical spine. While this finding can reflect a marrow infiltrative process, the most common causes include chronic anemia, smoking and obesity.   CTA head and neck (06/08/23): Personally reviewed and agree with radiology findings below FINDINGS: CTA NECK FINDINGS   Aortic arch: Great vessel origins are patent without  significant stenosis.   Right carotid system: No evidence of dissection, stenosis (50% or greater), or occlusion.   Left carotid system: No evidence of dissection, stenosis (50% or greater), or occlusion.   Vertebral arteries: Codominant. No evidence of dissection, stenosis (50% or greater), or occlusion.   Skeleton: No acute abnormality on limited assessment.   Other neck: No acute abnormality on limited assessment.   Upper chest: Visualized lung apices are clear.   Review of the MIP images confirms the above findings   CTA HEAD FINDINGS   Anterior circulation: Bilateral  intracranial and ACAs are patent without proximal hemodynamically significant stenosis. Left MCA is patent without proximal hemodynamically significant stenosis. Occluded right M3 MCA vessel (see series 5, image 92 and series 8, images 130 through 140).   Posterior circulation: Bilateral intradural vertebral arteries, basilar artery and bilateral posterior is are patent without proximal hemodynamically significant stenosis. Bilateral fetal type PCAs with small vertebrobasilar system, anatomic variant.   Venous sinuses: No evidence of dural venous sinus thrombosis.   Review of the MIP images confirms the above findings   IMPRESSION: 1. Occluded right M3 MCA, as described above. 2. Otherwise, no large vessel occlusion or proximal hemodynamically significant stenosis.   Echocardiogram (TEE - 06/10/23): 1. Left ventricular ejection fraction, by estimation, is 60 to 65%. The  left ventricle has normal function. The left ventricle has no regional  wall motion abnormalities. Moderate to severe left ventricular  hypertrophy.   2. Right ventricular systolic function is normal. The right ventricular  size is normal.   3. No left atrial/left atrial appendage thrombus was detected. The LAA  emptying velocity was 60 cm/s.   4. A small pericardial effusion is present. The pericardial effusion is  circumferential.  There is no evidence of cardiac tamponade.   5. The mitral valve is normal in structure. No evidence of mitral valve  regurgitation. No evidence of mitral stenosis.   6. The aortic valve is normal in structure. Aortic valve regurgitation is  not visualized. No aortic stenosis is present.   7. The inferior vena cava is normal in size with greater than 50%  respiratory variability, suggesting right atrial pressure of 3 mmHg.   8. Agitated saline contrast bubble study was negative, with no evidence  of any interatrial shunt.   Conclusion(s)/Recommendation(s): No LA/LAA thrombus identified. Negative  bubble study for interatrial shunt. No intracardiac source of embolism  detected on this on this transesophageal echocardiogram.    Cardiac event monitor (06/15/23-07/14/23): No afib seen  Assessment/Plan:  This is Anita [name], a 34 y.o. female with right MCA stroke (06/08/23). Her original symptoms were left sided weakness, left sided numbness, and difficulty speaking. Fortunately, she has no current residual symptoms, though does endorse some memory changes since her stroke, unclear if this is related to stroke or sleep/mood. Her MRI brain showed multiple very small, cortical strokes. CTA showed right M3 occlusion. She has a relevant medical history of HTN, HLD, anxiety, depression, OSA. Her neurological examination continues to be normal today. The etiology of stroke is currently unclear. Risk factors for stroke include HTN, HLD, OSA, and current smoking. Her stroke looks more embolic than small vessel though, so hypercoag state or cardioembolic etiology was considered but work up was normal. Patient is seeing cardiology and loop recorder should be considered.   Her BP is very high again today and she endorsed she had not yet taken her medications today. I recommended she consider going to ED but patient state she feels fine and does not need to go.   Plan: -Continue Aspirin  81 mg daily -Continue  Lipitor 40 mg daily -Discussed very high BP with patient and recommended she go to ED. She states she feels fine. Promised to take her BP medication and go to ED with headaches, chest pain, neurologic symptoms, or any other concerning symptoms. -Stroke warning signs discussed -Smoking cessation discussed  Return to clinic in 6 months  Total time spent reviewing records, interview, history/exam, documentation, and coordination of care on day of encounter:  30 min  Venetia  Davidlee Jeanbaptiste, MD     [1] No Known Allergies [2]  Social History Tobacco Use   Smoking status: Some Days    Types: Cigars   Smokeless tobacco: Never   Tobacco comments:    2 cigars a day  Vaping Use   Vaping status: Never Used  Substance Use Topics   Alcohol use: Yes    Comment: occas   Drug use: No   "

## 2024-01-28 ENCOUNTER — Ambulatory Visit: Admitting: Neurology

## 2024-01-28 ENCOUNTER — Encounter: Payer: Self-pay | Admitting: Neurology

## 2024-01-28 VITALS — BP 211/146 | HR 84 | Ht 66.0 in | Wt 252.0 lb

## 2024-01-28 DIAGNOSIS — F17200 Nicotine dependence, unspecified, uncomplicated: Secondary | ICD-10-CM | POA: Diagnosis not present

## 2024-01-28 DIAGNOSIS — I10 Essential (primary) hypertension: Secondary | ICD-10-CM

## 2024-01-28 DIAGNOSIS — Z8673 Personal history of transient ischemic attack (TIA), and cerebral infarction without residual deficits: Secondary | ICD-10-CM | POA: Diagnosis not present

## 2024-01-28 DIAGNOSIS — G4733 Obstructive sleep apnea (adult) (pediatric): Secondary | ICD-10-CM

## 2024-01-28 NOTE — Patient Instructions (Addendum)
-  Continue Aspirin  81 mg daily -Continue Lipitor 40 mg daily  -Discussed very high BP and recommended you go to ED. You promised to take your BP medication and go to ED with headaches, chest pain, neurologic symptoms, or any other concerning symptoms.  If you have new difficulty speaking, face droop, numbness on one side of the body, weakness on one side of the body, or dizziness/imbalance, this could be the sign of a stroke. Don't wait, please call EMS and be evaluated at the nearest emergency room.  I will see you again in 6 months.  Please let me know if you have any questions or concerns in the meantime.  The physicians and staff at [facility] are committed to providing excellent care. You may receive a survey requesting feedback about your experience at our office. We strive to receive very good responses to the survey questions. If you feel that your experience would prevent you from giving the office a very good  response, please contact our office to try to remedy the situation. We may be reached at [phone]. Thank you for taking the time out of your busy day to complete the survey.  Venetia Potters, MD [facility]

## 2024-02-01 ENCOUNTER — Telehealth: Payer: Self-pay | Admitting: Nurse Practitioner

## 2024-02-01 NOTE — Telephone Encounter (Signed)
 Contacted pt left vm to confirmed appt

## 2024-02-02 ENCOUNTER — Ambulatory Visit: Admitting: Nurse Practitioner

## 2024-08-18 ENCOUNTER — Ambulatory Visit: Payer: Self-pay | Admitting: Neurology
